# Patient Record
Sex: Female | Born: 1935 | Race: White | Hispanic: No | Smoking: Never smoker
Health system: Southern US, Community
[De-identification: ages and names within clinical notes are randomized; demographics above are authoritative.]

## PROBLEM LIST (undated history)

## (undated) DIAGNOSIS — G259 Extrapyramidal and movement disorder, unspecified: Secondary | ICD-10-CM

## (undated) DIAGNOSIS — I10 Essential (primary) hypertension: Secondary | ICD-10-CM

## (undated) DIAGNOSIS — F329 Major depressive disorder, single episode, unspecified: Secondary | ICD-10-CM

## (undated) DIAGNOSIS — E7800 Pure hypercholesterolemia, unspecified: Secondary | ICD-10-CM

## (undated) DIAGNOSIS — K222 Esophageal obstruction: Secondary | ICD-10-CM

## (undated) DIAGNOSIS — F3289 Other specified depressive episodes: Secondary | ICD-10-CM

## (undated) DIAGNOSIS — M549 Dorsalgia, unspecified: Secondary | ICD-10-CM

## (undated) DIAGNOSIS — M810 Age-related osteoporosis without current pathological fracture: Secondary | ICD-10-CM

## (undated) DIAGNOSIS — D509 Iron deficiency anemia, unspecified: Secondary | ICD-10-CM

## (undated) DIAGNOSIS — R0602 Shortness of breath: Secondary | ICD-10-CM

## (undated) DIAGNOSIS — F0390 Unspecified dementia without behavioral disturbance: Secondary | ICD-10-CM

## (undated) DIAGNOSIS — K449 Diaphragmatic hernia without obstruction or gangrene: Secondary | ICD-10-CM

## (undated) DIAGNOSIS — R42 Dizziness and giddiness: Secondary | ICD-10-CM

## (undated) DIAGNOSIS — Z860100 Personal history of colon polyps, unspecified: Secondary | ICD-10-CM

## (undated) DIAGNOSIS — R634 Abnormal weight loss: Secondary | ICD-10-CM

## (undated) DIAGNOSIS — E785 Hyperlipidemia, unspecified: Secondary | ICD-10-CM

## (undated) DIAGNOSIS — K219 Gastro-esophageal reflux disease without esophagitis: Secondary | ICD-10-CM

## (undated) DIAGNOSIS — E119 Type 2 diabetes mellitus without complications: Secondary | ICD-10-CM

## (undated) DIAGNOSIS — E669 Obesity, unspecified: Secondary | ICD-10-CM

## (undated) DIAGNOSIS — Z8673 Personal history of transient ischemic attack (TIA), and cerebral infarction without residual deficits: Secondary | ICD-10-CM

## (undated) DIAGNOSIS — D126 Benign neoplasm of colon, unspecified: Secondary | ICD-10-CM

## (undated) DIAGNOSIS — J309 Allergic rhinitis, unspecified: Secondary | ICD-10-CM

## (undated) DIAGNOSIS — E1165 Type 2 diabetes mellitus with hyperglycemia: Principal | ICD-10-CM

## (undated) DIAGNOSIS — K5730 Diverticulosis of large intestine without perforation or abscess without bleeding: Secondary | ICD-10-CM

## (undated) DIAGNOSIS — R1319 Other dysphagia: Secondary | ICD-10-CM

## (undated) HISTORY — DX: Personal history of transient ischemic attack (TIA), and cerebral infarction without residual deficits: Z86.73

## (undated) HISTORY — DX: Allergic rhinitis, unspecified: J30.9

## (undated) HISTORY — DX: Esophageal obstruction: K22.2

## (undated) HISTORY — DX: Diverticulosis of large intestine without perforation or abscess without bleeding: K57.30

## (undated) HISTORY — DX: Gastro-esophageal reflux disease without esophagitis: K21.9

## (undated) HISTORY — DX: Iron deficiency anemia, unspecified: D50.9

## (undated) HISTORY — DX: Hyperlipidemia, unspecified: E78.5

## (undated) HISTORY — DX: Age-related osteoporosis without current pathological fracture: M81.0

## (undated) HISTORY — DX: Dorsalgia, unspecified: M54.9

## (undated) HISTORY — DX: Type 2 diabetes mellitus with hyperglycemia: E11.65

## (undated) HISTORY — DX: Personal history of colonic polyps: Z86.010

## (undated) HISTORY — PX: APPENDECTOMY: SHX54

## (undated) HISTORY — PX: ABDOMINAL HYSTERECTOMY: SHX81

## (undated) HISTORY — DX: Obesity, unspecified: E66.9

## (undated) HISTORY — PX: TONSILLECTOMY AND ADENOIDECTOMY: SUR1326

## (undated) HISTORY — DX: Diaphragmatic hernia without obstruction or gangrene: K44.9

## (undated) HISTORY — DX: Shortness of breath: R06.02

## (undated) HISTORY — DX: Pure hypercholesterolemia, unspecified: E78.00

## (undated) HISTORY — DX: Major depressive disorder, single episode, unspecified: F32.9

## (undated) HISTORY — DX: Extrapyramidal and movement disorder, unspecified: G25.9

## (undated) HISTORY — DX: Other specified depressive episodes: F32.89

## (undated) HISTORY — DX: Essential (primary) hypertension: I10

## (undated) HISTORY — DX: Benign neoplasm of colon, unspecified: D12.6

## (undated) HISTORY — DX: Type 2 diabetes mellitus without complications: E11.9

## (undated) HISTORY — PX: OTHER SURGICAL HISTORY: SHX169

## (undated) HISTORY — DX: Other dysphagia: R13.19

## (undated) HISTORY — DX: Unspecified dementia without behavioral disturbance: F03.90

## (undated) HISTORY — DX: Abnormal weight loss: R63.4

## (undated) HISTORY — DX: Personal history of colon polyps, unspecified: Z86.0100

## (undated) HISTORY — DX: Dizziness and giddiness: R42

---

## 2000-06-05 ENCOUNTER — Encounter: Payer: Self-pay | Admitting: Emergency Medicine

## 2000-06-05 ENCOUNTER — Emergency Department (HOSPITAL_COMMUNITY): Admission: EM | Admit: 2000-06-05 | Discharge: 2000-06-05 | Payer: Self-pay | Admitting: Emergency Medicine

## 2000-12-19 ENCOUNTER — Emergency Department (HOSPITAL_COMMUNITY): Admission: EM | Admit: 2000-12-19 | Discharge: 2000-12-19 | Payer: Self-pay | Admitting: *Deleted

## 2000-12-19 ENCOUNTER — Encounter: Payer: Self-pay | Admitting: *Deleted

## 2001-04-20 ENCOUNTER — Encounter: Payer: Self-pay | Admitting: Surgery

## 2001-04-20 ENCOUNTER — Encounter: Admission: RE | Admit: 2001-04-20 | Discharge: 2001-04-20 | Payer: Self-pay | Admitting: Surgery

## 2001-04-21 ENCOUNTER — Encounter: Admission: RE | Admit: 2001-04-21 | Discharge: 2001-04-21 | Payer: Self-pay | Admitting: Surgery

## 2001-04-21 ENCOUNTER — Encounter: Payer: Self-pay | Admitting: Surgery

## 2001-05-03 ENCOUNTER — Encounter: Payer: Self-pay | Admitting: Surgery

## 2001-05-03 ENCOUNTER — Encounter: Admission: RE | Admit: 2001-05-03 | Discharge: 2001-05-03 | Payer: Self-pay | Admitting: Surgery

## 2001-05-30 ENCOUNTER — Encounter: Payer: Self-pay | Admitting: Anesthesiology

## 2001-06-02 ENCOUNTER — Ambulatory Visit (HOSPITAL_COMMUNITY): Admission: RE | Admit: 2001-06-02 | Discharge: 2001-06-02 | Payer: Self-pay | Admitting: Urology

## 2001-09-27 ENCOUNTER — Ambulatory Visit (HOSPITAL_BASED_OUTPATIENT_CLINIC_OR_DEPARTMENT_OTHER): Admission: RE | Admit: 2001-09-27 | Discharge: 2001-09-27 | Payer: Self-pay | Admitting: Urology

## 2003-07-08 ENCOUNTER — Emergency Department (HOSPITAL_COMMUNITY): Admission: EM | Admit: 2003-07-08 | Discharge: 2003-07-09 | Payer: Self-pay | Admitting: Emergency Medicine

## 2003-07-19 ENCOUNTER — Inpatient Hospital Stay (HOSPITAL_COMMUNITY): Admission: EM | Admit: 2003-07-19 | Discharge: 2003-07-21 | Payer: Self-pay | Admitting: Orthopedic Surgery

## 2004-05-08 ENCOUNTER — Encounter: Payer: Self-pay | Admitting: Gastroenterology

## 2004-05-28 DIAGNOSIS — K449 Diaphragmatic hernia without obstruction or gangrene: Secondary | ICD-10-CM | POA: Insufficient documentation

## 2004-05-28 DIAGNOSIS — K222 Esophageal obstruction: Secondary | ICD-10-CM | POA: Insufficient documentation

## 2004-07-24 ENCOUNTER — Ambulatory Visit: Payer: Self-pay | Admitting: Internal Medicine

## 2004-09-17 ENCOUNTER — Ambulatory Visit: Payer: Self-pay | Admitting: Internal Medicine

## 2005-05-12 ENCOUNTER — Ambulatory Visit: Payer: Self-pay | Admitting: Gastroenterology

## 2005-05-13 ENCOUNTER — Ambulatory Visit: Payer: Self-pay | Admitting: Gastroenterology

## 2005-06-01 ENCOUNTER — Ambulatory Visit: Payer: Self-pay | Admitting: Gastroenterology

## 2005-06-15 ENCOUNTER — Encounter (INDEPENDENT_AMBULATORY_CARE_PROVIDER_SITE_OTHER): Payer: Self-pay | Admitting: Specialist

## 2005-06-15 ENCOUNTER — Ambulatory Visit: Payer: Self-pay | Admitting: Gastroenterology

## 2005-06-15 DIAGNOSIS — D126 Benign neoplasm of colon, unspecified: Secondary | ICD-10-CM

## 2005-06-15 HISTORY — DX: Benign neoplasm of colon, unspecified: D12.6

## 2005-06-25 ENCOUNTER — Ambulatory Visit: Payer: Self-pay | Admitting: Cardiology

## 2005-12-21 ENCOUNTER — Ambulatory Visit: Payer: Self-pay | Admitting: Internal Medicine

## 2005-12-24 ENCOUNTER — Ambulatory Visit: Payer: Self-pay | Admitting: Internal Medicine

## 2006-02-16 ENCOUNTER — Ambulatory Visit: Payer: Self-pay | Admitting: Internal Medicine

## 2006-04-07 ENCOUNTER — Ambulatory Visit: Payer: Self-pay | Admitting: Internal Medicine

## 2006-06-24 ENCOUNTER — Ambulatory Visit: Payer: Self-pay | Admitting: Internal Medicine

## 2006-12-30 ENCOUNTER — Encounter: Payer: Self-pay | Admitting: Internal Medicine

## 2007-01-03 ENCOUNTER — Ambulatory Visit: Payer: Self-pay | Admitting: Internal Medicine

## 2007-01-03 LAB — CONVERTED CEMR LAB
ALT: 17 units/L (ref 0–40)
AST: 15 units/L (ref 0–37)
Albumin: 3.7 g/dL (ref 3.5–5.2)
Alkaline Phosphatase: 47 units/L (ref 39–117)
BUN: 34 mg/dL — ABNORMAL HIGH (ref 6–23)
Basophils Absolute: 0.1 10*3/uL (ref 0.0–0.1)
Basophils Relative: 1 % (ref 0.0–1.0)
Bilirubin, Direct: 0.1 mg/dL (ref 0.0–0.3)
CO2: 31 meq/L (ref 19–32)
Calcium: 9.3 mg/dL (ref 8.4–10.5)
Chloride: 99 meq/L (ref 96–112)
Cholesterol: 217 mg/dL (ref 0–200)
Creatinine, Ser: 1.6 mg/dL — ABNORMAL HIGH (ref 0.4–1.2)
Creatinine,U: 71.3 mg/dL
Direct LDL: 105.4 mg/dL
Eosinophils Absolute: 0.2 10*3/uL (ref 0.0–0.6)
Eosinophils Relative: 2.4 % (ref 0.0–5.0)
GFR calc Af Amer: 41 mL/min
GFR calc non Af Amer: 34 mL/min
Glucose, Bld: 188 mg/dL — ABNORMAL HIGH (ref 70–99)
HCT: 32.8 % — ABNORMAL LOW (ref 36.0–46.0)
HDL: 52.2 mg/dL (ref >39.0)
Hemoglobin: 11.2 g/dL — ABNORMAL LOW (ref 12.0–15.0)
Hgb A1c MFr Bld: 8.8 % — ABNORMAL HIGH (ref 4.6–6.0)
Lymphocytes Relative: 37.9 % (ref 12.0–46.0)
MCHC: 34.2 g/dL (ref 30.0–36.0)
MCV: 88 fL (ref 78.0–100.0)
Microalb Creat Ratio: 84.2 mg/g — ABNORMAL HIGH (ref 0.0–30.0)
Microalb, Ur: 6 mg/dL — ABNORMAL HIGH (ref 0.0–1.9)
Monocytes Absolute: 0.5 10*3/uL (ref 0.2–0.7)
Monocytes Relative: 7.5 % (ref 3.0–11.0)
Neutro Abs: 3.7 10*3/uL (ref 1.4–7.7)
Neutrophils Relative %: 51.2 % (ref 43.0–77.0)
Platelets: 269 10*3/uL (ref 150–400)
Potassium: 4.1 meq/L (ref 3.5–5.1)
RBC: 3.72 M/uL — ABNORMAL LOW (ref 3.87–5.11)
RDW: 11.8 % (ref 11.5–14.6)
Sodium: 136 meq/L (ref 135–145)
TSH: 1.38 microintl units/mL (ref 0.35–5.50)
Total Bilirubin: 0.3 mg/dL (ref 0.3–1.2)
Total CHOL/HDL Ratio: 4.2
Total Protein: 7.5 g/dL (ref 6.0–8.3)
Triglycerides: 285 mg/dL (ref 0–149)
VLDL: 57 mg/dL — ABNORMAL HIGH (ref 0–40)
WBC: 7.3 10*3/uL (ref 4.5–10.5)

## 2007-04-22 ENCOUNTER — Encounter: Payer: Self-pay | Admitting: Internal Medicine

## 2007-04-22 DIAGNOSIS — K219 Gastro-esophageal reflux disease without esophagitis: Secondary | ICD-10-CM | POA: Insufficient documentation

## 2007-04-22 DIAGNOSIS — E669 Obesity, unspecified: Secondary | ICD-10-CM | POA: Insufficient documentation

## 2007-04-22 DIAGNOSIS — I10 Essential (primary) hypertension: Secondary | ICD-10-CM | POA: Insufficient documentation

## 2007-04-22 DIAGNOSIS — E119 Type 2 diabetes mellitus without complications: Secondary | ICD-10-CM

## 2007-04-22 DIAGNOSIS — E7800 Pure hypercholesterolemia, unspecified: Secondary | ICD-10-CM | POA: Insufficient documentation

## 2007-04-22 HISTORY — DX: Type 2 diabetes mellitus without complications: E11.9

## 2007-05-04 DIAGNOSIS — M810 Age-related osteoporosis without current pathological fracture: Secondary | ICD-10-CM | POA: Insufficient documentation

## 2007-05-04 DIAGNOSIS — E785 Hyperlipidemia, unspecified: Secondary | ICD-10-CM | POA: Insufficient documentation

## 2007-09-08 HISTORY — PX: OTHER SURGICAL HISTORY: SHX169

## 2007-10-24 ENCOUNTER — Ambulatory Visit: Payer: Self-pay | Admitting: Internal Medicine

## 2007-10-24 DIAGNOSIS — R5381 Other malaise: Secondary | ICD-10-CM

## 2007-10-24 DIAGNOSIS — K5730 Diverticulosis of large intestine without perforation or abscess without bleeding: Secondary | ICD-10-CM | POA: Insufficient documentation

## 2007-10-24 DIAGNOSIS — Z860100 Personal history of colon polyps, unspecified: Secondary | ICD-10-CM | POA: Insufficient documentation

## 2007-10-24 DIAGNOSIS — R5383 Other fatigue: Secondary | ICD-10-CM

## 2007-10-26 LAB — CONVERTED CEMR LAB
ALT: 16 units/L (ref 0–35)
AST: 16 units/L (ref 0–37)
Albumin: 3.8 g/dL (ref 3.5–5.2)
Alkaline Phosphatase: 50 units/L (ref 39–117)
BUN: 23 mg/dL (ref 6–23)
Basophils Absolute: 0.1 10*3/uL (ref 0.0–0.1)
Basophils Relative: 0.9 % (ref 0.0–1.0)
Bilirubin, Direct: 0.1 mg/dL (ref 0.0–0.3)
CO2: 27 meq/L (ref 19–32)
Calcium: 9.4 mg/dL (ref 8.4–10.5)
Chloride: 101 meq/L (ref 96–112)
Cholesterol: 212 mg/dL (ref 0–200)
Creatinine, Ser: 1.1 mg/dL (ref 0.4–1.2)
Creatinine,U: 52 mg/dL
Direct LDL: 108.7 mg/dL
Eosinophils Absolute: 0.2 10*3/uL (ref 0.0–0.6)
Eosinophils Relative: 2.8 % (ref 0.0–5.0)
GFR calc Af Amer: 62 mL/min
GFR calc non Af Amer: 52 mL/min
Glucose, Bld: 141 mg/dL — ABNORMAL HIGH (ref 70–99)
HCT: 33.1 % — ABNORMAL LOW (ref 36.0–46.0)
HDL: 54 mg/dL (ref >39.0)
Hemoglobin: 11.2 g/dL — ABNORMAL LOW (ref 12.0–15.0)
Hgb A1c MFr Bld: 7.6 % — ABNORMAL HIGH (ref 4.6–6.0)
Lymphocytes Relative: 33.9 % (ref 12.0–46.0)
MCHC: 33.7 g/dL (ref 30.0–36.0)
MCV: 88.6 fL (ref 78.0–100.0)
Microalb Creat Ratio: 167.3 mg/g — ABNORMAL HIGH (ref 0.0–30.0)
Microalb, Ur: 8.7 mg/dL — ABNORMAL HIGH (ref 0.0–1.9)
Monocytes Absolute: 0.7 10*3/uL (ref 0.2–0.7)
Monocytes Relative: 8.2 % (ref 3.0–11.0)
Neutro Abs: 4.6 10*3/uL (ref 1.4–7.7)
Neutrophils Relative %: 54.2 % (ref 43.0–77.0)
Platelets: 283 10*3/uL (ref 150–400)
Potassium: 4.1 meq/L (ref 3.5–5.1)
RBC: 3.74 M/uL — ABNORMAL LOW (ref 3.87–5.11)
RDW: 12.3 % (ref 11.5–14.6)
Sodium: 136 meq/L (ref 135–145)
TSH: 1.51 microintl units/mL (ref 0.35–5.50)
Total Bilirubin: 0.5 mg/dL (ref 0.3–1.2)
Total CHOL/HDL Ratio: 3.9
Total Protein: 7.6 g/dL (ref 6.0–8.3)
Triglycerides: 236 mg/dL (ref 0–149)
VLDL: 47 mg/dL — ABNORMAL HIGH (ref 0–40)
WBC: 8.4 10*3/uL (ref 4.5–10.5)

## 2007-10-27 ENCOUNTER — Encounter: Payer: Self-pay | Admitting: Internal Medicine

## 2007-10-31 ENCOUNTER — Ambulatory Visit: Payer: Self-pay | Admitting: Internal Medicine

## 2007-11-02 ENCOUNTER — Telehealth: Payer: Self-pay | Admitting: Internal Medicine

## 2007-11-21 ENCOUNTER — Ambulatory Visit: Payer: Self-pay | Admitting: Cardiology

## 2007-11-21 LAB — CONVERTED CEMR LAB: Pro B Natriuretic peptide (BNP): 46 pg/mL (ref 0.0–100.0)

## 2007-12-01 ENCOUNTER — Ambulatory Visit: Payer: Self-pay

## 2007-12-01 ENCOUNTER — Encounter: Payer: Self-pay | Admitting: Cardiology

## 2007-12-06 ENCOUNTER — Ambulatory Visit: Payer: Self-pay | Admitting: Cardiology

## 2007-12-28 ENCOUNTER — Ambulatory Visit: Payer: Self-pay | Admitting: Emergency Medicine

## 2007-12-28 DIAGNOSIS — R0602 Shortness of breath: Secondary | ICD-10-CM

## 2008-01-09 ENCOUNTER — Ambulatory Visit: Payer: Self-pay | Admitting: Emergency Medicine

## 2008-01-12 ENCOUNTER — Encounter: Payer: Self-pay | Admitting: Emergency Medicine

## 2008-01-18 ENCOUNTER — Ambulatory Visit: Payer: Self-pay | Admitting: Emergency Medicine

## 2008-01-26 DIAGNOSIS — K5900 Constipation, unspecified: Secondary | ICD-10-CM | POA: Insufficient documentation

## 2008-01-31 ENCOUNTER — Ambulatory Visit: Payer: Self-pay | Admitting: Gastroenterology

## 2008-02-02 ENCOUNTER — Ambulatory Visit (HOSPITAL_COMMUNITY): Admission: RE | Admit: 2008-02-02 | Discharge: 2008-02-02 | Payer: Self-pay | Admitting: Gastroenterology

## 2008-02-03 ENCOUNTER — Telehealth (INDEPENDENT_AMBULATORY_CARE_PROVIDER_SITE_OTHER): Payer: Self-pay

## 2008-03-05 ENCOUNTER — Encounter: Payer: Self-pay | Admitting: Gastroenterology

## 2008-03-05 ENCOUNTER — Encounter: Payer: Self-pay | Admitting: Internal Medicine

## 2008-04-20 ENCOUNTER — Inpatient Hospital Stay (HOSPITAL_COMMUNITY): Admission: RE | Admit: 2008-04-20 | Discharge: 2008-04-24 | Payer: Self-pay | Admitting: Surgery

## 2008-05-18 ENCOUNTER — Encounter: Payer: Self-pay | Admitting: Internal Medicine

## 2008-05-18 ENCOUNTER — Encounter: Payer: Self-pay | Admitting: Gastroenterology

## 2008-08-09 ENCOUNTER — Encounter: Payer: Self-pay | Admitting: Internal Medicine

## 2008-09-10 LAB — HM MAMMOGRAPHY: HM Mammogram: NORMAL

## 2008-09-24 ENCOUNTER — Encounter: Payer: Self-pay | Admitting: Internal Medicine

## 2008-11-09 ENCOUNTER — Ambulatory Visit: Payer: Self-pay | Admitting: Internal Medicine

## 2008-11-12 LAB — CONVERTED CEMR LAB
ALT: 18 units/L (ref 0–35)
AST: 19 units/L (ref 0–37)
Albumin: 3.6 g/dL (ref 3.5–5.2)
Alkaline Phosphatase: 51 units/L (ref 39–117)
BUN: 19 mg/dL (ref 6–23)
Basophils Absolute: 0.1 10*3/uL (ref 0.0–0.1)
Basophils Relative: 0.9 % (ref 0.0–3.0)
Bilirubin, Direct: 0.1 mg/dL (ref 0.0–0.3)
CO2: 28 meq/L (ref 19–32)
Calcium: 9.3 mg/dL (ref 8.4–10.5)
Chloride: 104 meq/L (ref 96–112)
Cholesterol: 195 mg/dL (ref 0–200)
Creatinine, Ser: 1 mg/dL (ref 0.4–1.2)
Creatinine,U: 23.6 mg/dL
Eosinophils Absolute: 0.2 10*3/uL (ref 0.0–0.7)
Eosinophils Relative: 3.1 % (ref 0.0–5.0)
GFR calc Af Amer: 70 mL/min
GFR calc non Af Amer: 57 mL/min
Glucose, Bld: 77 mg/dL (ref 70–99)
HCT: 31.2 % — ABNORMAL LOW (ref 36.0–46.0)
HDL: 59 mg/dL (ref >39.0)
Hemoglobin: 10.8 g/dL — ABNORMAL LOW (ref 12.0–15.0)
Hgb A1c MFr Bld: 8.1 % — ABNORMAL HIGH (ref 4.6–6.0)
LDL Cholesterol: 107 mg/dL — ABNORMAL HIGH (ref 0–99)
Lymphocytes Relative: 40.2 % (ref 12.0–46.0)
MCHC: 34.5 g/dL (ref 30.0–36.0)
MCV: 87.4 fL (ref 78.0–100.0)
Microalb Creat Ratio: 402.5 mg/g — ABNORMAL HIGH (ref 0.0–30.0)
Microalb, Ur: 9.5 mg/dL — ABNORMAL HIGH (ref 0.0–1.9)
Monocytes Absolute: 0.5 10*3/uL (ref 0.1–1.0)
Monocytes Relative: 8.3 % (ref 3.0–12.0)
Neutro Abs: 3 10*3/uL (ref 1.4–7.7)
Neutrophils Relative %: 47.5 % (ref 43.0–77.0)
Platelets: 272 10*3/uL (ref 150–400)
Potassium: 3.9 meq/L (ref 3.5–5.1)
RBC: 3.57 M/uL — ABNORMAL LOW (ref 3.87–5.11)
RDW: 12.6 % (ref 11.5–14.6)
Sodium: 141 meq/L (ref 135–145)
TSH: 2.51 microintl units/mL (ref 0.35–5.50)
Total Bilirubin: 0.5 mg/dL (ref 0.3–1.2)
Total CHOL/HDL Ratio: 3.3
Total Protein: 7.2 g/dL (ref 6.0–8.3)
Triglycerides: 145 mg/dL (ref 0–149)
VLDL: 29 mg/dL (ref 0–40)
WBC: 6.4 10*3/uL (ref 4.5–10.5)

## 2009-03-22 ENCOUNTER — Inpatient Hospital Stay (HOSPITAL_COMMUNITY): Admission: AD | Admit: 2009-03-22 | Discharge: 2009-03-25 | Payer: Self-pay | Admitting: Internal Medicine

## 2009-03-22 ENCOUNTER — Ambulatory Visit: Payer: Self-pay | Admitting: Internal Medicine

## 2009-03-22 DIAGNOSIS — R1319 Other dysphagia: Secondary | ICD-10-CM

## 2009-03-22 DIAGNOSIS — E860 Dehydration: Secondary | ICD-10-CM | POA: Insufficient documentation

## 2009-03-22 DIAGNOSIS — R634 Abnormal weight loss: Secondary | ICD-10-CM

## 2009-03-22 DIAGNOSIS — R42 Dizziness and giddiness: Secondary | ICD-10-CM | POA: Insufficient documentation

## 2009-03-22 DIAGNOSIS — R059 Cough, unspecified: Secondary | ICD-10-CM | POA: Insufficient documentation

## 2009-03-22 DIAGNOSIS — R05 Cough: Secondary | ICD-10-CM

## 2009-03-25 ENCOUNTER — Encounter (INDEPENDENT_AMBULATORY_CARE_PROVIDER_SITE_OTHER): Payer: Self-pay | Admitting: *Deleted

## 2009-03-28 ENCOUNTER — Ambulatory Visit: Payer: Self-pay | Admitting: Gastroenterology

## 2009-05-01 ENCOUNTER — Ambulatory Visit: Payer: Self-pay | Admitting: Internal Medicine

## 2009-05-02 ENCOUNTER — Encounter (INDEPENDENT_AMBULATORY_CARE_PROVIDER_SITE_OTHER): Payer: Self-pay | Admitting: Internal Medicine

## 2009-05-02 ENCOUNTER — Ambulatory Visit: Payer: Self-pay | Admitting: Vascular Surgery

## 2009-05-02 ENCOUNTER — Ambulatory Visit: Payer: Self-pay | Admitting: Internal Medicine

## 2009-05-02 ENCOUNTER — Inpatient Hospital Stay (HOSPITAL_COMMUNITY): Admission: EM | Admit: 2009-05-02 | Discharge: 2009-05-04 | Payer: Self-pay | Admitting: Emergency Medicine

## 2009-05-06 ENCOUNTER — Telehealth: Payer: Self-pay | Admitting: Internal Medicine

## 2009-05-10 ENCOUNTER — Ambulatory Visit: Payer: Self-pay | Admitting: Internal Medicine

## 2009-05-10 DIAGNOSIS — D509 Iron deficiency anemia, unspecified: Secondary | ICD-10-CM

## 2009-05-10 DIAGNOSIS — F329 Major depressive disorder, single episode, unspecified: Secondary | ICD-10-CM

## 2009-05-10 LAB — CONVERTED CEMR LAB
Cholesterol, target level: 200 mg/dL
HDL goal, serum: 40 mg/dL
LDL Goal: 100 mg/dL

## 2009-05-14 ENCOUNTER — Encounter (INDEPENDENT_AMBULATORY_CARE_PROVIDER_SITE_OTHER): Payer: Self-pay | Admitting: *Deleted

## 2009-05-20 ENCOUNTER — Ambulatory Visit: Payer: Self-pay | Admitting: Internal Medicine

## 2009-05-24 ENCOUNTER — Telehealth: Payer: Self-pay | Admitting: Internal Medicine

## 2009-05-28 ENCOUNTER — Ambulatory Visit: Payer: Self-pay | Admitting: Internal Medicine

## 2009-05-28 DIAGNOSIS — M549 Dorsalgia, unspecified: Secondary | ICD-10-CM | POA: Insufficient documentation

## 2009-05-29 ENCOUNTER — Telehealth: Payer: Self-pay | Admitting: Internal Medicine

## 2009-05-31 ENCOUNTER — Telehealth: Payer: Self-pay | Admitting: Internal Medicine

## 2009-06-03 ENCOUNTER — Encounter: Payer: Self-pay | Admitting: Internal Medicine

## 2009-06-04 ENCOUNTER — Ambulatory Visit: Payer: Self-pay | Admitting: Gastroenterology

## 2009-06-04 LAB — CONVERTED CEMR LAB
ALT: 15 units/L (ref 0–35)
AST: 24 units/L (ref 0–37)
Albumin: 3.7 g/dL (ref 3.5–5.2)
Alkaline Phosphatase: 249 units/L — ABNORMAL HIGH (ref 39–117)
BUN: 21 mg/dL (ref 6–23)
Basophils Absolute: 0.1 10*3/uL (ref 0.0–0.1)
Basophils Relative: 0.8 % (ref 0.0–3.0)
Bilirubin, Direct: 0 mg/dL (ref 0.0–0.3)
CO2: 31 meq/L (ref 19–32)
Calcium: 8.9 mg/dL (ref 8.4–10.5)
Chloride: 106 meq/L (ref 96–112)
Creatinine, Ser: 1.5 mg/dL — ABNORMAL HIGH (ref 0.4–1.2)
Eosinophils Absolute: 0.1 10*3/uL (ref 0.0–0.7)
Eosinophils Relative: 1.9 % (ref 0.0–5.0)
Ferritin: 61.9 ng/mL (ref 10.0–291.0)
Folate: 13.2 ng/mL
GFR calc non Af Amer: 35.85 mL/min (ref >60)
Glucose, Bld: 196 mg/dL — ABNORMAL HIGH (ref 70–99)
HCT: 29.5 % — ABNORMAL LOW (ref 36.0–46.0)
Hemoglobin: 9.8 g/dL — ABNORMAL LOW (ref 12.0–15.0)
IgA: 455 mg/dL — ABNORMAL HIGH (ref 68–378)
Iron: 20 ug/dL — ABNORMAL LOW (ref 42–145)
Lymphocytes Relative: 32.2 % (ref 12.0–46.0)
Lymphs Abs: 2.1 10*3/uL (ref 0.7–4.0)
MCHC: 33.3 g/dL (ref 30.0–36.0)
MCV: 89.7 fL (ref 78.0–100.0)
Monocytes Absolute: 0.5 10*3/uL (ref 0.1–1.0)
Monocytes Relative: 7.7 % (ref 3.0–12.0)
Neutro Abs: 3.7 10*3/uL (ref 1.4–7.7)
Neutrophils Relative %: 57.4 % (ref 43.0–77.0)
Platelets: 274 10*3/uL (ref 150.0–400.0)
Potassium: 4 meq/L (ref 3.5–5.1)
RBC: 3.28 M/uL — ABNORMAL LOW (ref 3.87–5.11)
RDW: 13.2 % (ref 11.5–14.6)
Saturation Ratios: 7.1 % — ABNORMAL LOW (ref 20.0–50.0)
Sodium: 141 meq/L (ref 135–145)
TSH: 0.98 microintl units/mL (ref 0.35–5.50)
Tissue Transglutaminase Ab, IgA: 0.5 units (ref <7)
Total Bilirubin: 0.2 mg/dL — ABNORMAL LOW (ref 0.3–1.2)
Total Protein: 7.9 g/dL (ref 6.0–8.3)
Transferrin: 201.4 mg/dL — ABNORMAL LOW (ref 212.0–360.0)
Vitamin B-12: 562 pg/mL (ref 211–911)
WBC: 6.5 10*3/uL (ref 4.5–10.5)

## 2009-06-21 ENCOUNTER — Encounter: Payer: Self-pay | Admitting: Internal Medicine

## 2009-08-09 ENCOUNTER — Encounter: Payer: Self-pay | Admitting: Internal Medicine

## 2009-12-30 ENCOUNTER — Ambulatory Visit: Payer: Self-pay | Admitting: Internal Medicine

## 2009-12-30 DIAGNOSIS — J309 Allergic rhinitis, unspecified: Secondary | ICD-10-CM | POA: Insufficient documentation

## 2009-12-31 LAB — CONVERTED CEMR LAB
ALT: 17 units/L (ref 0–35)
AST: 21 units/L (ref 0–37)
Albumin: 3.9 g/dL (ref 3.5–5.2)
Alkaline Phosphatase: 69 units/L (ref 39–117)
BUN: 16 mg/dL (ref 6–23)
Basophils Absolute: 0.1 10*3/uL (ref 0.0–0.1)
Basophils Relative: 0.8 % (ref 0.0–3.0)
Bilirubin, Direct: 0.1 mg/dL (ref 0.0–0.3)
CO2: 29 meq/L (ref 19–32)
Calcium: 9.2 mg/dL (ref 8.4–10.5)
Chloride: 103 meq/L (ref 96–112)
Cholesterol: 216 mg/dL — ABNORMAL HIGH (ref 0–200)
Creatinine, Ser: 0.9 mg/dL (ref 0.4–1.2)
Creatinine,U: 20.4 mg/dL
Direct LDL: 129.3 mg/dL
Eosinophils Absolute: 0.2 10*3/uL (ref 0.0–0.7)
Eosinophils Relative: 2.2 % (ref 0.0–5.0)
Folate: 17 ng/mL
GFR calc non Af Amer: 64.53 mL/min (ref >60)
Glucose, Bld: 61 mg/dL — ABNORMAL LOW (ref 70–99)
HCT: 31 % — ABNORMAL LOW (ref 36.0–46.0)
HDL: 61.9 mg/dL (ref >39.00)
Hemoglobin: 10.6 g/dL — ABNORMAL LOW (ref 12.0–15.0)
Hgb A1c MFr Bld: 6.6 % — ABNORMAL HIGH (ref 4.6–6.5)
Iron: 69 ug/dL (ref 42–145)
Lymphocytes Relative: 34.2 % (ref 12.0–46.0)
Lymphs Abs: 2.6 10*3/uL (ref 0.7–4.0)
MCHC: 34.1 g/dL (ref 30.0–36.0)
MCV: 90 fL (ref 78.0–100.0)
Microalb Creat Ratio: 779.4 mg/g — ABNORMAL HIGH (ref 0.0–30.0)
Microalb, Ur: 15.9 mg/dL — ABNORMAL HIGH (ref 0.0–1.9)
Monocytes Absolute: 0.5 10*3/uL (ref 0.1–1.0)
Monocytes Relative: 6.9 % (ref 3.0–12.0)
Neutro Abs: 4.2 10*3/uL (ref 1.4–7.7)
Neutrophils Relative %: 55.9 % (ref 43.0–77.0)
Platelets: 278 10*3/uL (ref 150.0–400.0)
Potassium: 4 meq/L (ref 3.5–5.1)
RBC: 3.44 M/uL — ABNORMAL LOW (ref 3.87–5.11)
RDW: 13.5 % (ref 11.5–14.6)
Saturation Ratios: 20 % (ref 20.0–50.0)
Sed Rate: 47 mm/hr — ABNORMAL HIGH (ref 0–22)
Sodium: 141 meq/L (ref 135–145)
TSH: 1.88 microintl units/mL (ref 0.35–5.50)
Total Bilirubin: 0.6 mg/dL (ref 0.3–1.2)
Total CHOL/HDL Ratio: 3
Total Protein: 6.9 g/dL (ref 6.0–8.3)
Transferrin: 246.6 mg/dL (ref 212.0–360.0)
Triglycerides: 176 mg/dL — ABNORMAL HIGH (ref 0.0–149.0)
VLDL: 35.2 mg/dL (ref 0.0–40.0)
Vitamin B-12: 358 pg/mL (ref 211–911)
WBC: 7.5 10*3/uL (ref 4.5–10.5)

## 2010-02-06 ENCOUNTER — Encounter: Payer: Self-pay | Admitting: Internal Medicine

## 2010-06-12 ENCOUNTER — Ambulatory Visit: Payer: Self-pay | Admitting: Internal Medicine

## 2010-08-15 ENCOUNTER — Ambulatory Visit: Payer: Self-pay | Admitting: Internal Medicine

## 2010-10-07 NOTE — Assessment & Plan Note (Signed)
Summary: PER SON-D/T--FU PER SARAH SCHED--HANDICAP PLACARD REQUEST --STC   Vital Signs:  Patient profile:   77 year old female Height:      62 inches (157.48 cm) Weight:      152.4 pounds (69.27 kg) O2 Sat:      96 % on Room air Temp:     98.0 degrees F (36.67 degrees C) oral Pulse rate:   86 / minute BP sitting:   132 / 70  (left arm) Cuff size:   regular  Vitals Entered By: Orlan Leavens RMA (August 15, 2010 1:49 PM)  O2 Flow:  Room air CC: follow-up visit Is Patient Diabetic? Yes Did you bring your meter with you today? No Pain Assessment Patient in pain? no      Comments Pt states she stop taking citalopram. Also want to discuss Norvasc currently not taking. Requesting refills on meds, and handi cap form   Primary Care Provider:  Oliver Barre, M.D.  CC:  follow-up visit.  History of Present Illness: here with famly - overall doing ok, but since last seen has developed midl to mod worsening reflux symptoms with sour brash similar to that she had prior to anti-reflux surgury;  some improvement with TUMS  , and discomfort is non exertional, nonpleuritc, SSCP without  radiation, sob, diaphoresis palps or syncope though has occasioanl nausea with it.  Denies dysphagia, abd pain, bowel changes, blood, wt loss.  Overall good compliance with meds, and good tolerability.  Pt denies other CP, worsening sob, doe, wheezing, orthopnea, pnd, worsening LE edema, palps, dizziness or syncope .  Pt denies new neuro symptoms such as headache, facial or extremity weakness Pt denies polydipsia, polyuria, or low sugar symptoms such as shakiness improved with eating.  Overall good compliance with meds, trying to follow low chol, DM diet, and does walk daily.  Due to her gradual wt loss in the past yr or so, she had to stop the norvasc after last visit due to weakness and dizziness, since resolved.  Also seems to have rebounded nicely with the grief reaction over her husband's death, citalopram helped  quite a bit, but she stopped about a month ago, adn has since done well  - Denies worsening depressive symptoms, suicidal ideation, or panic.  Needs form filled out for driving handicap plate today  Problems Prior to Update: 1)  Allergic Rhinitis  (ICD-477.9) 2)  Back Pain  (ICD-724.5) 3)  Depression  (ICD-311) 4)  Anemia-iron Deficiency  (ICD-280.9) 5)  Need For Other Specified Prophylactic Measure  (ICD-V07.8) 6)  Weight Loss  (ICD-783.21) 7)  Other Dysphagia  (ICD-787.29) 8)  Dehydration  (ICD-276.51) 9)  Dizziness  (ICD-780.4) 10)  Cough  (ICD-786.2) 11)  Constipation  (ICD-564.00) 12)  Esophageal Stricture  (ICD-530.3) 13)  Hiatal Hernia  (ICD-553.3) 14)  Colonic Polyps  (ICD-211.3) 15)  Dyspnea  (ICD-786.05) 16)  Preventive Health Care  (ICD-V70.0) 17)  Fatigue  (ICD-780.79) 18)  Diverticulosis, Colon  (ICD-562.10) 19)  Colonic Polyps, Hx of  (ICD-V12.72) 20)  Osteoporosis  (ICD-733.00) 21)  Hyperlipidemia  (ICD-272.4) 22)  Hypercholesterolemia  (ICD-272.0) 23)  Obesity  (ICD-278.00) 24)  Hypertension  (ICD-401.9) 25)  Gerd  (ICD-530.81) 26)  Diabetes Mellitus, Type II  (ICD-250.00)  Medications Prior to Update: 1)  Aurora Lancet Super Thin 30g  Misc (Lancets) .... Use 1 Stick As Directed Once  A Day 2)  Onetouch Test  Strp (Glucose Blood) .... Use 1 Strip Once A Day  250.02 3)  Simvastatin 40 Mg Tabs (Simvastatin) .Marland Kitchen.. 1po Once Daily 4)  Norvasc 10 Mg  Tabs (Amlodipine Besylate) .... Take 1 By Mouth Once Daily 5)  Lantus 100 Unit/ml  Soln (Insulin Glargine) .... Take 40 Units Subcutaneously Once Daily 6)  Ecotrin Low Strength 81 Mg  Tbec (Aspirin) .Marland Kitchen.. 1po Qd 7)  Metformin Hcl 500 Mg  Tabs (Metformin Hcl) .... 2 By Mouth Two Times A Day 8)  Citalopram Hydrobromide 10 Mg Tabs (Citalopram Hydrobromide) .Marland Kitchen.. 1po Once Daily 9)  Claritin 10 Mg Tabs (Loratadine) .Marland Kitchen.. 1po Once Daily As Needed  Current Medications (verified): 1)  Aurora Lancet Super Thin 30g  Misc (Lancets)  .... Use 1 Stick As Directed Once  A Day 2)  Simvastatin 40 Mg Tabs (Simvastatin) .Marland Kitchen.. 1po Once Daily 3)  Norvasc 10 Mg  Tabs (Amlodipine Besylate) .... Take 1 By Mouth Once Daily 4)  Lantus 100 Unit/ml  Soln (Insulin Glargine) .... Take 40 Units Subcutaneously Once Daily 5)  Ecotrin Low Strength 81 Mg  Tbec (Aspirin) .Marland Kitchen.. 1po Qd 6)  Metformin Hcl 500 Mg  Tabs (Metformin Hcl) .... 2 By Mouth Two Times A Day 7)  Claritin 10 Mg Tabs (Loratadine) .Marland Kitchen.. 1po Once Daily As Needed 8)  Omeprazole 40 Mg Cpdr (Omeprazole) .Marland Kitchen.. 1po Once Daily  Allergies (verified): 1)  ! Vicodin 2)  ! Codeine 3)  ! Percocet  Past History:  Past Medical History: Last updated: 12/30/2009 Diabetes mellitus, type II GERD Hypertension Hyperlipidemia Obesity Osteoporosis DM gastroparesis chronic constipation Colonic polyps, hx of - Dr Patterson/GI Diverticulosis, colon nephropathy - DM retinopathy - DM Anemia-iron deficiency Depression Allergic rhinitis  Past Surgical History: Last updated: 06/03/2009 Appendectomy Hysterectomy Tonsillectomy adenoidectomy left ankle surgery s/p bladder pubovaginal sling s/p cystocele/rectocele repair Laparoscopic takedown of incarcerated stomach within the chest/NISSEN 2009.   Social History: Last updated: 12/30/2009 Never Smoked Alcohol use-no widow Daily Caffeine Use husband terminally ill with lung cancer - married x 53yrs - died 2010-04-18Drug use-no  Risk Factors: Smoking Status: never (10/24/2007)  Review of Systems       all otherwise negative per pt -    Physical Exam  General:  alert and well-developed.   Head:  normocephalic and atraumatic.   Eyes:  vision grossly intact, pupils equal, and pupils round.   Ears:  R ear normal and L ear normal.   Nose:  no external deformity and no nasal discharge.   Mouth:  no gingival abnormalities and pharynx pink and moist.   Neck:  supple and no masses.   Lungs:  normal respiratory effort and normal  breath sounds.   Heart:  normal rate and regular rhythm.   Abdomen:  soft, non-tender, and normal bowel sounds.   Extremities:  no edema, no erythema  Psych:  not anxious appearing and not depressed appearing.     Impression & Recommendations:  Problem # 1:  GERD (ICD-530.81)  Her updated medication list for this problem includes:    Omeprazole 40 Mg Cpdr (Omeprazole) .Marland Kitchen... 1po once daily treat as above, f/u any worsening signs or symptoms , ok for TUMS as needed, and consider Dr Jarold Motto f/u if not improved  Labs Reviewed: Hgb: 10.6 (12/30/2009)   Hct: 31.0 (12/30/2009)  Problem # 2:  HYPERTENSION (ICD-401.9)  Her updated medication list for this problem includes:    Norvasc 10 Mg Tabs (Amlodipine besylate) .Marland Kitchen... Take 1 by mouth once daily  BP today: 132/70 Prior BP: 146/60 (12/30/2009)  Prior 10 Yr Risk Heart Disease:  24 % (05/10/2009)  Labs Reviewed: K+: 4.0 (12/30/2009) Creat: : 0.9 (12/30/2009)   Chol: 216 (12/30/2009)   HDL: 61.90 (12/30/2009)   LDL: 107 (11/09/2008)   TG: 176.0 (12/30/2009) stable overall by hx and exam, ok to continue meds/tx as is - does not need norvasc at this time, to f/u BP at home and next visit  Problem # 3:  DIABETES MELLITUS, TYPE II (ICD-250.00)  Her updated medication list for this problem includes:    Lantus 100 Unit/ml Soln (Insulin glargine) .Marland Kitchen... Take 40 units subcutaneously once daily    Ecotrin Low Strength 81 Mg Tbec (Aspirin) .Marland Kitchen... 1po qd    Metformin Hcl 500 Mg Tabs (Metformin hcl) .Marland Kitchen... 2 by mouth two times a day  Labs Reviewed: Creat: 0.9 (12/30/2009)    Reviewed HgBA1c results: 6.6 (12/30/2009)  8.1 (11/09/2008) has been very complaint with diet now that she lives with supportive family, wt overall stable, good med tolerance,  will forgo labs today as per pt request as not likely to have changed significantly and f/u next visit,  Pt to cont DM diet, excercise, wt control efforts  Problem # 4:  DEPRESSION (ICD-311)  The  following medications were removed from the medication list:    Citalopram Hydrobromide 10 Mg Tabs (Citalopram hydrobromide) .Marland Kitchen... 1po once daily with grief reaction; resolved, ok to follow off med  Complete Medication List: 1)  Aurora Lancet Super Thin 30g Misc (Lancets) .... Use 1 stick as directed once  a day 2)  Simvastatin 40 Mg Tabs (Simvastatin) .Marland Kitchen.. 1po once daily 3)  Norvasc 10 Mg Tabs (Amlodipine besylate) .... Take 1 by mouth once daily 4)  Lantus 100 Unit/ml Soln (Insulin glargine) .... Take 40 units subcutaneously once daily 5)  Ecotrin Low Strength 81 Mg Tbec (Aspirin) .Marland Kitchen.. 1po qd 6)  Metformin Hcl 500 Mg Tabs (Metformin hcl) .... 2 by mouth two times a day 7)  Claritin 10 Mg Tabs (Loratadine) .Marland Kitchen.. 1po once daily as needed 8)  Omeprazole 40 Mg Cpdr (Omeprazole) .Marland Kitchen.. 1po once daily  Patient Instructions: 1)  you are given the handicap form filled out today 2)  Please take all new medications as prescribed - the omeprazole 40 mg per day 3)  Continue all previous medications as before this visit 4)  Please schedule a follow-up appointment in April 2012 for followup office visit Prescriptions: OMEPRAZOLE 40 MG CPDR (OMEPRAZOLE) 1po once daily  #90 x 3   Entered and Authorized by:   Corwin Levins MD   Signed by:   Corwin Levins MD on 08/15/2010   Method used:   Electronically to        [facility] (retail)       [address]       [place], [place]  [address]       Ph: [phone]       Fax: [phone]   RxID:   [id number] METFORMIN HCL 500 MG  TABS (METFORMIN HCL) 2 by mouth two times a day  #360 x 3   Entered and Authorized by:   Corwin Levins MD   Signed by:   Corwin Levins MD on 08/15/2010   Method used:   Electronically to        [facility] (retail)       [address]       [place],  [place]  [address]       Ph: [phone]       Fax: [phone]   RxID:   [id number] METFORMIN HCL 500 MG   TABS (METFORMIN HCL) 2 by mouth two times a day  #260 x 3   Entered and Authorized by:   Corwin Levins MD   Signed by:   Corwin Levins MD on 08/15/2010   Method used:   Electronically to        [facility] (retail)       [address]       [place], [place]  [address]       Ph: [phone]       Fax: [phone]   RxID:   [id number]    Orders Added: 1)  Est. Patient Level IV [88416]

## 2010-10-07 NOTE — Assessment & Plan Note (Signed)
Summary: flu shot/#/cd   Nurse Visit   Allergies: 1)  ! Vicodin 2)  ! Codeine 3)  ! Percocet  Orders Added: 1)  Flu Vaccine 51yrs + MEDICARE PATIENTS [Q2039] 2)  Administration Flu vaccine - MCR [G0008]    Flu Vaccine Consent Questions     Do you have a history of severe allergic reactions to this vaccine? no    Any prior history of allergic reactions to egg and/or gelatin? no    Do you have a sensitivity to the preservative Thimersol? no    Do you have a past history of Guillan-Barre Syndrome? no    Do you currently have an acute febrile illness? no    Have you ever had a severe reaction to latex? no    Vaccine information given and explained to patient? yes    Are you currently pregnant? no    Lot Number:[id number]   Exp Date:03/07/2011   Site Given  Left Deltoid IM

## 2010-10-07 NOTE — Assessment & Plan Note (Signed)
Summary: FU--MEDS PER SON/GARY---STC   Vital Signs:  Patient profile:   77 year old female Height:      62 inches Weight:      149 pounds BMI:     27.35 O2 Sat:      97 % on Room air Temp:     97.7 degrees F oral Pulse rate:   77 / minute BP sitting:   146 / 60  (left arm) Cuff size:   regular  Vitals Entered ByZella Ball Ewing (December 30, 2009 4:23 PM)  O2 Flow:  Room air  CC: followup on meds/RE   Primary Care Provider:  Oliver Barre, M.D.  CC:  followup on meds/RE.  History of Present Illness: overall improved;  husband died 2010/04/30and she was markedly depressed as per last visit;  son with her today very supportive;  she and son agree she has remarkably improved with her depressive symtpoms now at least 90% improved, though still working through grieving;  has good compliance and good tolerabilty of the SSRI;  wt up 3 lbs and appetitie improved;  no longer walking with the cane;  does have mild nasal allergy symtpoms and left ear fullness and mild hearing loss for several wks - has had some hearing loss gradually over the past yr as well, has not tried any OTC antihist;  Pt denies CP, sob, doe, wheezing, orthopnea, pnd, worsening LE edema, palps, dizziness or syncope   Pt denies new neuro symptoms such as headache, facial or extremity weakness   Pt denies polydipsia, polyuria, or low sugar symptoms such as shakiness improved with eating.  Overall good compliance with meds, trying to follow low chol, DM diet, little excercise however  Here for wellness Diet: Heart Healthy or DM if diabetic Physical Activities: Sedentary Depression/mood screen: improved as above Hearing: mild decreased left ear as above Visual Acuity: Grossly normal, gets exam yearly ADL's: Capable  Fall Risk: mild at best, improved from 2010 Home Safety: Good Cognitive Impairment:  Gen appearance, affect, speech, memory, attention & motor skills grossly intact End-of-Life Planning: Advance directive - Full  code/I agree   Preventive Screening-Counseling & Management      Drug Use:  no.    Problems Prior to Update: 1)  Allergic Rhinitis  (ICD-477.9) 2)  Back Pain  (ICD-724.5) 3)  Depression  (ICD-311) 4)  Anemia-iron Deficiency  (ICD-280.9) 5)  Need For Other Specified Prophylactic Measure  (ICD-V07.8) 6)  Weight Loss  (ICD-783.21) 7)  Other Dysphagia  (ICD-787.29) 8)  Dehydration  (ICD-276.51) 9)  Dizziness  (ICD-780.4) 10)  Cough  (ICD-786.2) 11)  Constipation  (ICD-564.00) 12)  Esophageal Stricture  (ICD-530.3) 13)  Hiatal Hernia  (ICD-553.3) 14)  Colonic Polyps  (ICD-211.3) 15)  Dyspnea  (ICD-786.05) 16)  Preventive Health Care  (ICD-V70.0) 17)  Fatigue  (ICD-780.79) 18)  Diverticulosis, Colon  (ICD-562.10) 19)  Colonic Polyps, Hx of  (ICD-V12.72) 20)  Osteoporosis  (ICD-733.00) 21)  Hyperlipidemia  (ICD-272.4) 22)  Hypercholesterolemia  (ICD-272.0) 23)  Obesity  (ICD-278.00) 24)  Hypertension  (ICD-401.9) 25)  Gerd  (ICD-530.81) 26)  Diabetes Mellitus, Type II  (ICD-250.00)  Medications Prior to Update: 1)  Aurora Lancet Super Thin 30g  Misc (Lancets) .... Use 1 Stick As Directed Once  A Day 2)  Onetouch Test  Strp (Glucose Blood) .... Use 1 Strip Once A Day 3)  Simvastatin 80 Mg Tabs (Simvastatin) .Marland Kitchen.. 1po Once Daily 4)  Norvasc 10 Mg  Tabs (Amlodipine Besylate) .... Take  1 By Mouth Qd 5)  Lantus 100 Unit/ml  Soln (Insulin Glargine) .... Take 40 Units Subcutaneously Qd 6)  Darvocet-N 100 100-650 Mg Tabs (Propoxyphene N-Apap) .Marland Kitchen.. 1 By Mouth Q 6 Hrs As Needed 7)  Ecotrin Low Strength 81 Mg  Tbec (Aspirin) .Marland Kitchen.. 1po Qd 8)  Metformin Hcl 500 Mg  Tabs (Metformin Hcl) .... 2 By Mouth Two Times A Day 9)  Ferrous Sulfate 324 Mg Tabs (Ferrous Sulfate) .Marland Kitchen.. 1 By Mouth Two Times A Day 10)  Citalopram Hydrobromide 10 Mg Tabs (Citalopram Hydrobromide) .Marland Kitchen.. 1po Once Daily  Current Medications (verified): 1)  Aurora Lancet Super Thin 30g  Misc (Lancets) .... Use 1 Stick As Directed  Once  A Day 2)  Onetouch Test  Strp (Glucose Blood) .... Use 1 Strip Once A Day  250.02 3)  Simvastatin 40 Mg Tabs (Simvastatin) .Marland Kitchen.. 1po Once Daily 4)  Norvasc 10 Mg  Tabs (Amlodipine Besylate) .... Take 1 By Mouth Once Daily 5)  Lantus 100 Unit/ml  Soln (Insulin Glargine) .... Take 40 Units Subcutaneously Once Daily 6)  Ecotrin Low Strength 81 Mg  Tbec (Aspirin) .Marland Kitchen.. 1po Qd 7)  Metformin Hcl 500 Mg  Tabs (Metformin Hcl) .... 2 By Mouth Two Times A Day 8)  Citalopram Hydrobromide 10 Mg Tabs (Citalopram Hydrobromide) .Marland Kitchen.. 1po Once Daily 9)  Claritin 10 Mg Tabs (Loratadine) .Marland Kitchen.. 1po Once Daily As Needed  Allergies (verified): 1)  ! Vicodin 2)  ! Codeine 3)  ! Percocet  Past History:  Past Surgical History: Last updated: 06/03/2009 Appendectomy Hysterectomy Tonsillectomy adenoidectomy left ankle surgery s/p bladder pubovaginal sling s/p cystocele/rectocele repair Laparoscopic takedown of incarcerated stomach within the chest/NISSEN 2009.   Family History: Last updated: 10/24/2007 father with colon cancer, DM mother with heart disease, DM, MI  Social History: Last updated: 12/30/2009 Never Smoked Alcohol use-no widow Daily Caffeine Use husband terminally ill with lung cancer - married x 10yrs - died 04/22/10Drug use-no  Risk Factors: Smoking Status: never (10/24/2007)  Past Medical History: Diabetes mellitus, type II GERD Hypertension Hyperlipidemia Obesity Osteoporosis DM gastroparesis chronic constipation Colonic polyps, hx of - Dr Patterson/GI Diverticulosis, colon nephropathy - DM retinopathy - DM Anemia-iron deficiency Depression Allergic rhinitis  Social History: Reviewed history from 03/22/2009 and no changes required. Never Smoked Alcohol use-no widow Daily Caffeine Use husband terminally ill with lung cancer - married x 17yrs - died April 22, 2010Drug use-no Drug Use:  no  Review of Systems  The patient denies anorexia, fever, weight  loss, vision loss, decreased hearing, hoarseness, chest pain, syncope, dyspnea on exertion, peripheral edema, prolonged cough, headaches, hemoptysis, abdominal pain, melena, hematochezia, severe indigestion/heartburn, hematuria, muscle weakness, suspicious skin lesions, depression, unusual weight change, abnormal bleeding, enlarged lymph nodes, and angioedema.         all otherwise negative per pt -  except for mild fatigue without OSA symtpoms  Physical Exam  General:  alert and well-developed.   Head:  normocephalic and atraumatic.   Eyes:  vision grossly intact, pupils equal, and pupils round.   Ears:  R ear normal.  , left tm mild erythema Nose:  nasal dischargemucosal pallor and mucosal edema.   Mouth:  pharyngeal erythema and fair dentition.   Neck:  supple and no masses.   Lungs:  normal respiratory effort and normal breath sounds.   Heart:  normal rate and regular rhythm.   Abdomen:  soft, non-tender, and normal bowel sounds.   Msk:  no joint tenderness and no joint  swelling.   Extremities:  no edema, no erythema  Neurologic:  cranial nerves II-XII intact and strength normal in all extremities.  except for mild left hearing loss Skin:  color normal and no rashes. , has several onychomycotic nails Psych:  not anxious appearing and not depressed appearing.     Impression & Recommendations:  Problem # 1:  Preventive Health Care (ICD-V70.0)  Overall doing well, age appropriate education and counseling updated and referral for appropriate preventive services done unless declined, immunizations up to date or declined, diet counseling done if overweight, urged to quit smoking if smokes , most recent labs reviewed and current ordered if appropriate, ecg reviewed or declined (interpretation per ECG scanned in the EMR if done); information regarding Medicare Prevention requirements given if appropriate   Orders: First annual wellness visit with prevention plan  (Z6109)  Problem # 2:   ALLERGIC RHINITIS (ICD-477.9)  ok for OTC claritin  Her updated medication list for this problem includes:    Claritin 10 Mg Tabs (Loratadine) .Marland Kitchen... 1po once daily as needed  Problem # 3:  FATIGUE (ICD-780.79) exam benign, to check labs below; follow with expectant management  Orders: TLB-BMP (Basic Metabolic Panel-BMET) (80048-METABOL) TLB-CBC Platelet - w/Differential (85025-CBCD) TLB-Hepatic/Liver Function Pnl (80076-HEPATIC) TLB-TSH (Thyroid Stimulating Hormone) (84443-TSH) TLB-Sedimentation Rate (ESR) (85652-ESR) TLB-IBC Pnl (Iron/FE;Transferrin) (83550-IBC) TLB-B12 + Folate Pnl (60454_09811-B14/NWG)  Problem # 4:  HYPERLIPIDEMIA (ICD-272.4)  Her updated medication list for this problem includes:    Simvastatin 40 Mg Tabs (Simvastatin) .Marland Kitchen... 1po once daily  Labs Reviewed: SGOT: 24 (06/04/2009)   SGPT: 15 (06/04/2009)  Lipid Goals: Chol Goal: 200 (05/10/2009)   HDL Goal: 40 (05/10/2009)   LDL Goal: 100 (05/10/2009)   TG Goal: 150 (05/10/2009)  Prior 10 Yr Risk Heart Disease: 24 % (05/10/2009)   HDL:59.0 (11/09/2008), 54.0 (10/24/2007)  LDL:107 (11/09/2008), DEL (10/24/2007)  Chol:195 (11/09/2008), 212 (10/24/2007)  Trig:145 (11/09/2008), 236 (10/24/2007) stable overall by hx and exam, ok to continue meds/tx as is , Pt to continue diet efforts, good med tolerance; to check labs - goal LDL less than 70   Problem # 5:  DIABETES MELLITUS, TYPE II (ICD-250.00)  Her updated medication list for this problem includes:    Lantus 100 Unit/ml Soln (Insulin glargine) .Marland Kitchen... Take 40 units subcutaneously once daily    Ecotrin Low Strength 81 Mg Tbec (Aspirin) .Marland Kitchen... 1po qd    Metformin Hcl 500 Mg Tabs (Metformin hcl) .Marland Kitchen... 2 by mouth two times a day  Orders: TLB-Lipid Panel (80061-LIPID) TLB-Microalbumin/Creat Ratio, Urine (82043-MALB) TLB-A1C / Hgb A1C (Glycohemoglobin) (83036-A1C)  Labs Reviewed: Creat: 1.5 (06/04/2009)    Reviewed HgBA1c results: 8.1 (11/09/2008)  7.6  (10/24/2007) recently uncontrolled overall as above, wtih recent wt loss - to check labs, cont diet anad wt control efforts, cont meds as is  Problem # 6:  ANEMIA-IRON DEFICIENCY (ICD-280.9)  The following medications were removed from the medication list:    Ferrous Sulfate 324 Mg Tabs (Ferrous sulfate) .Marland Kitchen... 1 by mouth two times a day to re-check labs today, sept iron panel pattern c/w chronic dz, to stop the iron  Problem # 7:  COLONIC POLYPS (ICD-211.3) due for f/u Orders: Gastroenterology Referral (GI)  Problem # 8:  HYPERTENSION (ICD-401.9)  Her updated medication list for this problem includes:    Norvasc 10 Mg Tabs (Amlodipine besylate) .Marland Kitchen... Take 1 by mouth once daily for some reason , not takiing the amlodipione - to re-start   Orders: Prescription Created Electronically [phone])  BP today:  146/60 Prior BP: 138/60 (06/04/2009)  Prior 10 Yr Risk Heart Disease: 24 % (05/10/2009)  Labs Reviewed: K+: 4.0 (06/04/2009) Creat: : 1.5 (06/04/2009)   Chol: 195 (11/09/2008)   HDL: 59.0 (11/09/2008)   LDL: 107 (11/09/2008)   TG: 145 (11/09/2008)  Problem # 9:  DEPRESSION (ICD-311)  Her updated medication list for this problem includes:    Citalopram Hydrobromide 10 Mg Tabs (Citalopram hydrobromide) .Marland Kitchen... 1po once daily improved, to cont SSRI for at least 6 more mo  Complete Medication List: 1)  Aurora Lancet Super Thin 30g Misc (Lancets) .... Use 1 stick as directed once  a day 2)  Onetouch Test Strp (Glucose blood) .... Use 1 strip once a day  250.02 3)  Simvastatin 40 Mg Tabs (Simvastatin) .Marland Kitchen.. 1po once daily 4)  Norvasc 10 Mg Tabs (Amlodipine besylate) .... Take 1 by mouth once daily 5)  Lantus 100 Unit/ml Soln (Insulin glargine) .... Take 40 units subcutaneously once daily 6)  Ecotrin Low Strength 81 Mg Tbec (Aspirin) .Marland Kitchen.. 1po qd 7)  Metformin Hcl 500 Mg Tabs (Metformin hcl) .... 2 by mouth two times a day 8)  Citalopram Hydrobromide 10 Mg Tabs (Citalopram hydrobromide)  .Marland Kitchen.. 1po once daily 9)  Claritin 10 Mg Tabs (Loratadine) .Marland Kitchen.. 1po once daily as needed  Patient Instructions: 1)  [facility] to[facility] 2)  Please go to the Lab in the basement for your blood and/or urine tests today 3)  You will be contacted about the referral(s) to: colonoscopy 4)  please call the ENT for the hearing loss, as you need 5)  all of your prescriptions were sent to the pharmacy 6)  you can also take OTC Claritin for the allergies 7)  Please schedule a follow-up appointment in 6 months. Prescriptions: CITALOPRAM HYDROBROMIDE 10 MG TABS (CITALOPRAM HYDROBROMIDE) 1po once daily  #90 x 3   Entered and Authorized by:   Corwin Levins MD   Signed by:   Corwin Levins MD on 12/30/2009   Method used:   Electronically to        [facility] (retail)       [address]       [place], [place]  [address]       Ph: [phone]       Fax: [phone]   RxID:   [id number] METFORMIN HCL 500 MG  TABS (METFORMIN HCL) 2 by mouth two times a day  #360 x 3   Entered and Authorized by:   Corwin Levins MD   Signed by:   Corwin Levins MD on 12/30/2009   Method used:   Electronically to        [facility] (retail)       [address]       [place], [place]  [address]       Ph: [phone]       Fax: [phone]   RxID:   [id number] LANTUS 100 UNIT/ML  SOLN (INSULIN GLARGINE) take 40 units subcutaneously once daily  #1 box x 11   Entered and Authorized by:   Corwin Levins MD   Signed by:   Corwin Levins MD on 12/30/2009   Method used:   Electronically to        [facility] (retail)       [address]  [address]       [place], [place]  [address]       Ph: [phone]       Fax: [phone]   RxID:   [id number] NORVASC 10 MG  TABS (AMLODIPINE BESYLATE) TAKE 1 by mouth once daily  #90 x 3   Entered and Authorized by:   Corwin Levins MD   Signed by:   Corwin Levins MD on  12/30/2009   Method used:   Electronically to        [facility] (retail)       [address]       [place], [place]  [address]       Ph: [phone]       Fax: [phone]   RxID:   [id number] Letta Pate TEST  STRP (GLUCOSE BLOOD) Use 1 strip once a day  250.02  #100 x 11   Entered and Authorized by:   Corwin Levins MD   Signed by:   Corwin Levins MD on 12/30/2009   Method used:   Electronically to        [facility] (retail)       [address]       [place], [place]  [address]       Ph: [phone]       Fax: [phone]   RxID:   [id number] AURORA LANCET SUPER THIN 30G  MISC (LANCETS) Use 1 stick as directed once  a day  #100 x 11   Entered and Authorized by:   Corwin Levins MD   Signed by:   Corwin Levins MD on 12/30/2009   Method used:   Electronically to        [facility] (retail)       [address]       [place], [place]  [address]       Ph: [phone]       Fax: [phone]   RxID:   [id number] SIMVASTATIN 40 MG TABS (SIMVASTATIN) 1po once daily  #90 x 3   Entered and Authorized by:   Corwin Levins MD   Signed by:   Corwin Levins MD on 12/30/2009   Method used:   Electronically to        [facility] (retail)       [address]       [place], [place]  [address]       Ph: [phone]       Fax: [phone]   RxID:   [id number]

## 2010-10-07 NOTE — Letter (Signed)
Summary: Referral - not able to see patient  [facility]  [address] [place], [place] [address]   Phone: [phone]  Fax: [phone]    February 06, 2010   Corwin Levins, M.D. [address] [place], [place] [address]   Re:   [name] DOB:  1931/01/27 MRN:   [id number]    Dear Dr. Jonny Ruiz:  Thank you for your kind referral of the above patient.  We have attempted to schedule the recommended procedure Screening Colonoscopy but have not been able to schedule because:  ___ The patient was not available by phone and/or has not returned our calls.   X  The patient declined to schedule the procedure at this time.  We appreciate the referral and hope that we will have the opportunity to treat this patient in the future.    Sincerely,    [facility] [phone]

## 2010-12-13 LAB — GLUCOSE, CAPILLARY
Glucose-Capillary: 116 mg/dL — ABNORMAL HIGH (ref 70–99)
Glucose-Capillary: 146 mg/dL — ABNORMAL HIGH (ref 70–99)
Glucose-Capillary: 263 mg/dL — ABNORMAL HIGH (ref 70–99)

## 2010-12-13 LAB — URINALYSIS, ROUTINE W REFLEX MICROSCOPIC
Bilirubin Urine: NEGATIVE
Glucose, UA: NEGATIVE mg/dL
Hgb urine dipstick: NEGATIVE
Ketones, ur: NEGATIVE mg/dL
Nitrite: NEGATIVE
Protein, ur: NEGATIVE mg/dL
Specific Gravity, Urine: 1.004 — ABNORMAL LOW (ref 1.005–1.030)
Urobilinogen, UA: 0.2 mg/dL (ref 0.0–1.0)
pH: 6 (ref 5.0–8.0)

## 2010-12-13 LAB — COMPREHENSIVE METABOLIC PANEL
ALT: 15 U/L (ref 0–35)
AST: 25 U/L (ref 0–37)
Albumin: 3.6 g/dL (ref 3.5–5.2)
Alkaline Phosphatase: 43 U/L (ref 39–117)
BUN: 12 mg/dL (ref 6–23)
CO2: 26 mEq/L (ref 19–32)
Calcium: 9.1 mg/dL (ref 8.4–10.5)
Chloride: 104 mEq/L (ref 96–112)
Creatinine, Ser: 0.96 mg/dL (ref 0.4–1.2)
GFR calc Af Amer: >60 mL/min (ref >60)
GFR calc non Af Amer: 57 mL/min — ABNORMAL LOW (ref >60)
Glucose, Bld: 140 mg/dL — ABNORMAL HIGH (ref 70–99)
Potassium: 3.8 mEq/L (ref 3.5–5.1)
Sodium: 135 mEq/L (ref 135–145)
Total Bilirubin: 0.5 mg/dL (ref 0.3–1.2)
Total Protein: 7.1 g/dL (ref 6.0–8.3)

## 2010-12-13 LAB — BASIC METABOLIC PANEL
CO2: 24 mEq/L (ref 19–32)
Calcium: 8.3 mg/dL — ABNORMAL LOW (ref 8.4–10.5)
Chloride: 107 mEq/L (ref 96–112)
Creatinine, Ser: 0.81 mg/dL (ref 0.4–1.2)
GFR calc Af Amer: >60 mL/min (ref >60)
GFR calc non Af Amer: >60 mL/min (ref >60)
Glucose, Bld: 122 mg/dL — ABNORMAL HIGH (ref 70–99)
Glucose, Bld: 188 mg/dL — ABNORMAL HIGH (ref 70–99)
Sodium: 137 mEq/L (ref 135–145)
Sodium: 139 mEq/L (ref 135–145)

## 2010-12-13 LAB — CARDIAC PANEL(CRET KIN+CKTOT+MB+TROPI)
CK, MB: 1.3 ng/mL (ref 0.3–4.0)
Relative Index: 1 (ref 0.0–2.5)
Total CK: 104 U/L (ref 7–177)
Total CK: 128 U/L (ref 7–177)
Troponin I: 0.02 ng/mL (ref 0.00–0.06)

## 2010-12-13 LAB — IRON AND TIBC
Saturation Ratios: 12 % — ABNORMAL LOW (ref 20–55)
TIBC: 258 ug/dL (ref 250–470)

## 2010-12-13 LAB — FOLATE: Folate: 12.4 ng/mL

## 2010-12-13 LAB — DIFFERENTIAL
Basophils Relative: 0 % (ref 0–1)
Lymphocytes Relative: 20 % (ref 12–46)
Monocytes Relative: 6 % (ref 3–12)
Neutro Abs: 8.1 10*3/uL — ABNORMAL HIGH (ref 1.7–7.7)
Neutrophils Relative %: 73 % (ref 43–77)

## 2010-12-13 LAB — POCT CARDIAC MARKERS
CKMB, poc: 1.3 ng/mL (ref 1.0–8.0)
Myoglobin, poc: 118 ng/mL (ref 12–200)
Troponin i, poc: <0.05 ng/mL (ref 0.00–0.09)

## 2010-12-13 LAB — BRAIN NATRIURETIC PEPTIDE: Pro B Natriuretic peptide (BNP): 61 pg/mL (ref 0.0–100.0)

## 2010-12-13 LAB — RETICULOCYTES
RBC.: 3.18 MIL/uL — ABNORMAL LOW (ref 3.87–5.11)
Retic Count, Absolute: 22.3 10*3/uL (ref 19.0–186.0)

## 2010-12-13 LAB — URINE MICROSCOPIC-ADD ON

## 2010-12-13 LAB — URINE CULTURE
Colony Count: 75000
Special Requests: NEGATIVE

## 2010-12-13 LAB — TSH: TSH: 1.26 u[IU]/mL (ref 0.350–4.500)

## 2010-12-13 LAB — CBC
HCT: 30.9 % — ABNORMAL LOW (ref 36.0–46.0)
Hemoglobin: 10.4 g/dL — ABNORMAL LOW (ref 12.0–15.0)
MCHC: 33.7 g/dL (ref 30.0–36.0)
MCV: 89.2 fL (ref 78.0–100.0)
Platelets: 237 10*3/uL (ref 150–400)
RBC: 3.47 MIL/uL — ABNORMAL LOW (ref 3.87–5.11)
RDW: 13.7 % (ref 11.5–15.5)
WBC: 11.1 10*3/uL — ABNORMAL HIGH (ref 4.0–10.5)

## 2010-12-13 LAB — FERRITIN: Ferritin: 40 ng/mL (ref 10–291)

## 2010-12-13 LAB — CK TOTAL AND CKMB (NOT AT ARMC): CK, MB: 1.6 ng/mL (ref 0.3–4.0)

## 2010-12-14 LAB — CBC
HCT: 37 % (ref 36.0–46.0)
MCHC: 33.8 g/dL (ref 30.0–36.0)
MCV: 88.5 fL (ref 78.0–100.0)
Platelets: 188 10*3/uL (ref 150–400)
RDW: 13.7 % (ref 11.5–15.5)

## 2010-12-14 LAB — GLUCOSE, CAPILLARY
Glucose-Capillary: 123 mg/dL — ABNORMAL HIGH (ref 70–99)
Glucose-Capillary: 147 mg/dL — ABNORMAL HIGH (ref 70–99)
Glucose-Capillary: 152 mg/dL — ABNORMAL HIGH (ref 70–99)
Glucose-Capillary: 163 mg/dL — ABNORMAL HIGH (ref 70–99)
Glucose-Capillary: 170 mg/dL — ABNORMAL HIGH (ref 70–99)
Glucose-Capillary: 176 mg/dL — ABNORMAL HIGH (ref 70–99)
Glucose-Capillary: 179 mg/dL — ABNORMAL HIGH (ref 70–99)
Glucose-Capillary: 185 mg/dL — ABNORMAL HIGH (ref 70–99)

## 2010-12-14 LAB — URINE CULTURE
Culture: NO GROWTH
Special Requests: NEGATIVE

## 2010-12-14 LAB — HEPATIC FUNCTION PANEL
Bilirubin, Direct: 0.1 mg/dL (ref 0.0–0.3)
Indirect Bilirubin: 0.8 mg/dL (ref 0.3–0.9)

## 2010-12-14 LAB — CULTURE, BLOOD (ROUTINE X 2): Culture: NO GROWTH

## 2010-12-14 LAB — HEMOGLOBIN A1C
Hgb A1c MFr Bld: 7.1 % — ABNORMAL HIGH (ref 4.6–6.1)
Mean Plasma Glucose: 157 mg/dL

## 2010-12-14 LAB — BASIC METABOLIC PANEL
BUN: 10 mg/dL (ref 6–23)
Chloride: 102 mEq/L (ref 96–112)
GFR calc non Af Amer: 55 mL/min — ABNORMAL LOW (ref >60)
Glucose, Bld: 177 mg/dL — ABNORMAL HIGH (ref 70–99)
Potassium: 4 mEq/L (ref 3.5–5.1)

## 2010-12-28 ENCOUNTER — Encounter: Payer: Self-pay | Admitting: Internal Medicine

## 2010-12-28 DIAGNOSIS — Z0000 Encounter for general adult medical examination without abnormal findings: Secondary | ICD-10-CM | POA: Insufficient documentation

## 2010-12-30 ENCOUNTER — Other Ambulatory Visit (INDEPENDENT_AMBULATORY_CARE_PROVIDER_SITE_OTHER): Payer: Medicare Other | Admitting: Internal Medicine

## 2010-12-30 ENCOUNTER — Other Ambulatory Visit (INDEPENDENT_AMBULATORY_CARE_PROVIDER_SITE_OTHER): Payer: Medicare Other

## 2010-12-30 ENCOUNTER — Ambulatory Visit (INDEPENDENT_AMBULATORY_CARE_PROVIDER_SITE_OTHER): Payer: Medicare Other | Admitting: Internal Medicine

## 2010-12-30 ENCOUNTER — Encounter: Payer: Self-pay | Admitting: Internal Medicine

## 2010-12-30 VITALS — BP 132/60 | HR 78 | Temp 98.5°F

## 2010-12-30 DIAGNOSIS — E785 Hyperlipidemia, unspecified: Secondary | ICD-10-CM

## 2010-12-30 DIAGNOSIS — E119 Type 2 diabetes mellitus without complications: Secondary | ICD-10-CM

## 2010-12-30 DIAGNOSIS — R0989 Other specified symptoms and signs involving the circulatory and respiratory systems: Secondary | ICD-10-CM

## 2010-12-30 DIAGNOSIS — I10 Essential (primary) hypertension: Secondary | ICD-10-CM

## 2010-12-30 DIAGNOSIS — R0609 Other forms of dyspnea: Secondary | ICD-10-CM

## 2010-12-30 DIAGNOSIS — Z13220 Encounter for screening for lipoid disorders: Secondary | ICD-10-CM

## 2010-12-30 LAB — CBC WITH DIFFERENTIAL/PLATELET
Basophils Relative: 0.5 % (ref 0.0–3.0)
Eosinophils Relative: 1.2 % (ref 0.0–5.0)
Hemoglobin: 10.7 g/dL — ABNORMAL LOW (ref 12.0–15.0)
Lymphocytes Relative: 34.8 % (ref 12.0–46.0)
Neutrophils Relative %: 56.1 % (ref 43.0–77.0)
RBC: 3.49 Mil/uL — ABNORMAL LOW (ref 3.87–5.11)
WBC: 6.4 10*3/uL (ref 4.5–10.5)

## 2010-12-30 LAB — HEMOGLOBIN A1C: Hgb A1c MFr Bld: 7.7 % — ABNORMAL HIGH (ref 4.6–6.5)

## 2010-12-30 LAB — BASIC METABOLIC PANEL
BUN: 19 mg/dL (ref 6–23)
Calcium: 9.9 mg/dL (ref 8.4–10.5)
Chloride: 104 mEq/L (ref 96–112)
Creatinine, Ser: 1.2 mg/dL (ref 0.4–1.2)

## 2010-12-30 LAB — LIPID PANEL
Cholesterol: 264 mg/dL — ABNORMAL HIGH (ref 0–200)
HDL: 71.2 mg/dL (ref >39.00)
Triglycerides: 86 mg/dL (ref 0.0–149.0)
VLDL: 17.2 mg/dL (ref 0.0–40.0)

## 2010-12-30 LAB — HEPATIC FUNCTION PANEL
ALT: 16 U/L (ref 0–35)
Total Bilirubin: 0.7 mg/dL (ref 0.3–1.2)
Total Protein: 7.3 g/dL (ref 6.0–8.3)

## 2010-12-30 MED ORDER — INSULIN GLARGINE 100 UNIT/ML ~~LOC~~ SOLN: 23.0000 [IU] | Freq: Every day | SUBCUTANEOUS | Status: DC

## 2010-12-30 MED ORDER — SIMVASTATIN 40 MG PO TABS: 40.0000 mg | ORAL_TABLET | Freq: Every day | ORAL | Status: DC

## 2010-12-30 MED ORDER — INSULIN GLARGINE 100 UNIT/ML ~~LOC~~ SOLN: 40.0000 [IU] | Freq: Every day | SUBCUTANEOUS | Status: DC

## 2010-12-30 MED ORDER — METFORMIN HCL 500 MG PO TABS: 1000.0000 mg | ORAL_TABLET | Freq: Two times a day (BID) | ORAL | Status: DC

## 2010-12-30 NOTE — Patient Instructions (Addendum)
Continue all other medications as before, including the simvastatin Your EKG was ok today - no acute changes Please return in 3 wks , or sooner if needed, to go over test results

## 2010-12-30 NOTE — Assessment & Plan Note (Signed)
stable overall by hx and exam, most recent lab reviewed with pt, and pt to continue medical treatment as before  BP Readings from Last 3 Encounters:  12/30/10 132/60  08/15/10 132/70  12/30/09 146/60

## 2010-12-30 NOTE — Assessment & Plan Note (Signed)
stable overall by hx and exam, most recent lab reviewed with pt, and pt to continue medical treatment as before  Lab Results  Component Value Date   HGBA1C 6.6* 12/30/2009

## 2010-12-30 NOTE — Assessment & Plan Note (Signed)
Pt has been off the statin to try diet;  Ok to re-start with lipid check today  Lab Results  Component Value Date   [facility]  Value: 96        Total Cholesterol/HDL:CHD Risk Coronary Heart Disease Risk Table                     Men   Women  1/2 Average Risk   3.4   3.3  Average Risk       5.0   4.4  2 X Average Risk   9.6   7.1  3 X Average Risk  23.4   11.0        Use the calculated Patient Ratio above and the CHD Risk Table to determine the patient's CHD Risk.        ATP III CLASSIFICATION (LDL):  <100     mg/dL   Optimal  161-096  mg/dL   Near or Above                    Optimal  130-159  mg/dL   Borderline  045-409  mg/dL   High  >811     mg/dL   Very High 05/21/7828

## 2010-12-30 NOTE — Assessment & Plan Note (Signed)
Exam benign except for mild overwt and deconditioning;  Has mild non prod cough of unclear clinical significance;  No overt wheezing/chf/pna or other at this time;  Will check labs/ecg/cxr/pft/echo with f/u 3 wks

## 2011-01-04 ENCOUNTER — Encounter: Payer: Self-pay | Admitting: Internal Medicine

## 2011-01-04 NOTE — Progress Notes (Signed)
Subjective:    Patient ID: [name], female    DOB: [birth year], 77 y.o.   MRN: [id number]  HPI Here to f/u; overall doing ok,  Pt denies chest pain, wheezing, orthopnea, PND, increased LE swelling, palpitations, dizziness or syncope.  Pt denies new neurological symptoms such as new headache, or facial or extremity weakness or numbness   Pt denies polydipsia, polyuria, or low sugar symptoms such as weakness or confusion improved with po intake.  Pt states overall good compliance with meds, trying to follow lower cholesterol, diabetic diet, wt overall stable but little exercise however.  Does have unsual DOE gradually increased in the past 2-3 months, now having to walk slowly in from the parking lot or she would have to stop to rest.   Pt denies fever, wt loss, night sweats, loss of appetite, or other constitutional symptoms  Overall good compliance with treatment, and good medicine tolerability.  Denies worsening depressive symptoms, suicidal ideation, or panic, though has ongoing anxiety, not increased recently.   No other new complaints Past Medical History  Diagnosis Date  . COLONIC POLYPS 06/15/2005  . DIABETES MELLITUS, TYPE II 04/22/2007  . HYPERCHOLESTEROLEMIA 04/22/2007  . HYPERLIPIDEMIA 05/04/2007  . Dehydration 03/22/2009  . OBESITY 04/22/2007  . ANEMIA-IRON DEFICIENCY 05/10/2009  . DEPRESSION 05/10/2009  . HYPERTENSION 04/22/2007  . ALLERGIC RHINITIS 12/30/2009  . ESOPHAGEAL STRICTURE 05/28/2004  . GERD 04/22/2007  . HIATAL HERNIA 05/28/2004  . DIVERTICULOSIS, COLON 10/24/2007  . CONSTIPATION 01/26/2008  . BACK PAIN 05/28/2009  . OSTEOPOROSIS 05/04/2007  . DIZZINESS 03/22/2009  . FATIGUE 10/24/2007  . WEIGHT LOSS 03/22/2009  . DYSPNEA 12/28/2007  . Other dysphagia 03/22/2009  . COLONIC POLYPS, HX OF 10/24/2007   Past Surgical History  Procedure Date  . Appendectomy   . Abdominal hysterectomy   . Tonsillectomy   . Adenoidectomy   . Left ankle surgury   . S/p bladder pubovaginal sling     . S/p cystocele/rectocele repair   . Laparoscopic takedown of incarcerated stomach within the chest/nissen 2009    reports that she has never smoked. She does not have any smokeless tobacco history on file. She reports that she does not drink alcohol or use illicit drugs. family history includes Cancer in her father; Diabetes in her father and mother; Heart attack in her mother; and Heart disease in her mother. Allergies  Allergen Reactions  . Codeine   . Hydrocodone-Acetaminophen   . Oxycodone-Acetaminophen    Current Outpatient Prescriptions on File Prior to Visit  Medication Sig Dispense Refill  . aspirin 81 MG EC tablet Take 81 mg by mouth daily.        Jerrell Belfast Lancet Super Thin 30G MISC Use 1 stick as directed once daily       . omeprazole (PRILOSEC) 40 MG capsule Take 40 mg by mouth daily.         Review of Systems Review of Systems  Constitutional: Negative for diaphoresis and unexpected weight change.  HENT: Negative for drooling and tinnitus.   Eyes: Negative for photophobia and visual disturbance.  Respiratory: Negative for choking and stridor.   Gastrointestinal: Negative for vomiting and blood in stool.  Genitourinary: Negative for hematuria and decreased urine volume.  Musculoskeletal: Negative for gait problem.  Skin: Negative for color change and wound.  Neurological: Negative for tremors and numbness.  Psychiatric/Behavioral: Negative for decreased concentration. The patient is not hyperactive.       Objective:   Physical Exam BP 132/60  Pulse  78  Temp(Src) 98.5 F (36.9 C) (Oral)  SpO2 94% Physical Exam  VS noted Constitutional: Pt appears well-developed and well-nourished.  HENT: Head: Normocephalic.  Right Ear: External ear normal.  Left Ear: External ear normal.  Eyes: Conjunctivae and EOM are normal. Pupils are equal, round, and reactive to light.  Neck: Normal range of motion. Neck supple.  Cardiovascular: Normal rate and regular rhythm.    Pulmonary/Chest: Effort normal and breath sounds normal.  Abd:  Soft, NT, non-distended, + BS Neurological: Pt is alert. No cranial nerve deficit.  Skin: Skin is warm. No erythema.  Psychiatric: Pt behavior is normal. Thought content normal.         Assessment & Plan:

## 2011-01-08 ENCOUNTER — Other Ambulatory Visit (HOSPITAL_COMMUNITY): Payer: PRIVATE HEALTH INSURANCE | Admitting: Radiology

## 2011-01-15 ENCOUNTER — Ambulatory Visit: Payer: PRIVATE HEALTH INSURANCE | Admitting: Internal Medicine

## 2011-01-20 NOTE — Discharge Summary (Signed)
[name], KETTER NO.:  [id number]   MEDICAL RECORD NO.:  [id number]          PATIENT TYPE:  INP   LOCATION:  1502                         FACILITY:  [facility]   PHYSICIAN:  Corwin Levins, MD      DATE OF BIRTH:  August 21, 1931   DATE OF ADMISSION:  03/22/2009  DATE OF DISCHARGE:                               DISCHARGE SUMMARY   DATE OF DISCHARGE:  Anticipated date of discharge March 25, 2009.   DISCHARGE DIAGNOSES:  1. Cough of probable viral illness.  2. Nausea and vomiting with dehydration.  3. Hypertension.  4. Diabetes mellitus.  5. Dysphagia, finishing the work up today.  6. Weight loss.  7. Grief reaction.  8. History of esophageal stricture.  9. History of colon polyps.  10.Diverticulosis.  11.Osteoporosis.  12.Hyperlipidemia.  13.Gastroesophageal reflux disease.   PROCEDURES:  Barium swallow to be done March 25, 2009, not yet performed  but planned.   CONSULTATIONS:  Gastroenterology.   HISTORY OF PRESENT ILLNESS:  Please see the history and physical  dictated on the EMR on the date of admission by myself.   HOSPITAL COURSE:  [name] is a 77 year old white female whose  husband unfortunately died 2 months ago and she does live alone.  She  has family in the area and has basically refused much attention until  about a week prior to admission when she developed low grade temperature  associated with nausea and vomiting and cough.  She became weaker and  the night prior to admission she did stay the night at the daughter-in-  law's home as they were concerned about her.  The morning of admission  she was weak, staggering, dizzy and light headed with persistent nausea  and vomiting, productive cough and required admission for further  evaluation and management.  It was noted at that time her white blood  cell count was 6.5, hemoglobin 12.5, BMET within normal limits except  for glucose of 177, liver function tests within normal limits,  hemoglobin A1C was 7.1. Blood and urine cultures were obtained and are  still pending at the time for discharge as is Legionella antigen, Strep  pneumonia urinary antigen as well.  Sputum Gram stain and sputum culture  apparently were not able to be obtained.  The patient was treated with  IV antibiotics and IV fluids to which she has responded quite nicely  over the last several days as well as reinstitution of her blood  pressure and diabetic regimen she has at home.  Gastroenterology was  asked to see her on March 24, 2009, whose impression was a recurrence of  dysphagia after Nissen and history of previous poor peristalsis.  Their  recommendation was to obtain a barium swallow on March 25, 2009 with  further recommendations to be made pending results.  There is some  question of need for EGD but this is not clear.  On the morning of  dictation, she is much improved since I last saw her.  She has no  further dizziness, nausea, vomiting or abdominal discomfort.  There is  some cough but nonproductive.  No further dizziness, staggering and  clearly does not need physical therapy or occupational therapy at this  time.  She is off antibiotics at this time, for the last 24 hours and  remains afebrile.  She has been NPO after midnight, still on IV fluids  for that.  She feels she is ready to go home if things turn out okay  today. Her CBG's have been in the mid 150s and exam today is essentially  benign with abdomen soft, nontender, positive bowel sounds.  Cardiac  exam has regular rhythm and her chest is clear to auscultation.  Blood  pressure is slightly high at 146/75 but she is afebrile.  Oxygen  saturation is 94%.   DISPOSITION:  Likely/possible discharge home later today pending results  of barium swallow and gastroenterology recommendations.  She will  followup with myself in 1 to 2 weeks and gastroenterology as per their  recommendations.  She will be discharged on previous home  medications to  include:   DISCHARGE MEDICATIONS:  1. Hydrochlorothiazide 25 mg one p.o. daily.  2. Benazepril 40 mg p.o. daily.  3. Fosamax 70 mg  p.o. daily.  4. Amaryl 4 mg p.o. b.i.d.  5. Simvastatin 80 mg p.o. daily.  6. Norvasc 10 mg p.o. daily.  7. Lantus 40 units subcutaneously daily.  8. Ibuprofen 200 mg p.r.n.  9. Ecotrin 81 mg one p.o. daily.  10.Metformin 500 mg, two p.o. b.i.d.      Corwin Levins, MD  Electronically Signed     JWJ/MEDQ  D:  03/25/2009  T:  03/25/2009  Job:  [id number]

## 2011-01-20 NOTE — Op Note (Signed)
[name], [name]             ACCOUNT NO.:  [id number]   MEDICAL RECORD NO.:  [id number]          PATIENT TYPE:  INP   LOCATION:  0001                         FACILITY:  [facility]   PHYSICIAN:  Thornton Park. Daphine Deutscher, MD  DATE OF BIRTH:  [birth year]   DATE OF PROCEDURE:  04/20/2008  DATE OF DISCHARGE:                               OPERATIVE REPORT   PREOPERATIVE INDICATIONS:  [name] is a 77 year old Caucasian  female who has had worsening of heartburn, reflux and difficulty with  breathing.  She was seen in the office on March 05, 2008 regarding  laparoscopic Nissen and repair of hiatal hernia.  She is brought to the  OR at this time for that procedure.   SURGEON:  Thornton Park. Daphine Deutscher, MD   ASSISTANT:  Leonie Man, M.D.   PROCEDURES:  1. Laparoscopic takedown of incarcerated stomach within the chest.  2. Laparoscopic repair of hiatus with 3 pledgeted sutures posteriorly.  3. Upper endoscopy to verify anatomy.  4. Nissen fundoplication over a #50 lighted bougie.   DESCRIPTION OF PROCEDURE:  [name] was taken to room 1 on  Friday, April 20, 2008 and given general anesthesia.  The abdomen was  prepped with Techni-Care and draped sterilely.  I entered the abdomen  through the left upper quadrant using a 0-degree scope without  difficulty, insufflated the abdomen and placed the standard trocars.  Scissors were used to take down some adhesions from her previous midline  incision.   With a Nathanson retractor, providing a retraction of the liver, I then  grasped the stomach with a Babcock clamp and brought into the abdomen.  I then began dissection with a harmonic scalpel.  I took down the sac  and in so doing freed the stomach kept it below the diaphragm.  I then  did further mobilization up in the chest, to relieve it of the numerous  adhesions that were holding it there.  A Penrose drain was placed around  the stomach for traction; this aided in this complete  dissection.  Prior  to repairing the diaphragm, I went ahead to the head of the table and  passed the endoscope and identified the GE junction.  I felt that I had  adequate length into the abdomen.   At that point I went ahead and I placed 3 sutures, using pledgets  posteriorly to approximate the right and left crus.  I got good  visualization of this.   The #50 lighted bougie was then passed by Dr. Raymon Mutton, and around that I did  a wrap with 3 sutures; doing all 3 with tie knots.  A nice healthy wrap  was present.  The bougie was removed; Tisseel was applied underneath the  wrap and on  the wrap.  The retractors were withdrawn.  The port sites were injected  with Marcaine and the abdomen was deflated.  Wounds were closed with 4-0  Vicryl, with Benzoin and Steri-Strips on the skin.  The patient seemed  to tolerate the procedure well.  She was taken to the recovery room in  satisfactory condition.  Thornton Park Daphine Deutscher, MD  Electronically Signed     MBM/MEDQ  D:  04/20/2008  T:  04/20/2008  Job:  [id number]   cc:   Vania Rea. Jarold Motto, MD, FACG, FACP, FAGA  [address]  [place]  [place] [address]   Leslye Peer, MD  [address]  [place], [place] [address]   Corwin Levins, MD  [address]  [place]  [place] [address]

## 2011-01-20 NOTE — Assessment & Plan Note (Signed)
[facility]                            CARDIOLOGY OFFICE NOTE   NAME:[name], Leicht                    MRN:          [id number]  DATE:12/06/2007                            DOB:          1930-12-19    PRIMARY:  Dr. Oliver Barre.   REASON FOR PRESENTATION:  Evaluate the patient with dyspnea.   HISTORY OF PRESENT ILLNESS:  The patient returns for followup of her  dyspnea.  I sent her for an echocardiogram which demonstrated an EF 65%  with some mildly increased left ventricular wall thickness, but no other  significant evidence for diastolic dysfunction.  She had a normal BNP  level.  She had a stress perfusion study which demonstrated no evidence  of ischemia with a well-preserved ejection fraction.  Therefore, I do  not believe this to be an anginal equivalent or indicative of  obstructive coronary disease.  I brought her back today to review this.  Again, she describes rather sudden onset dyspnea with exertion over the  last 3 to 4 months.  This is quite limiting to her.  She is not  describing resting shortness of breath.  She is not having any chest  pain.   PAST MEDICAL HISTORY:  See previous note.   PHYSICAL EXAM:  I performed a limited exam.  VITAL SIGNS:  142/60, heart rate 80 and regular, weight 177 pounds.  LUNGS:  Clear to auscultation bilaterally.  NECK:  No jugular distention.  HEART:  PMI not displaced or sustained, S1-S2 within normal so no S3, no  S4, no murmurs.  EXTREMITIES:  2+ pulses without edema.   ASSESSMENT/PLAN:  1. Dyspnea.  This is the patient's one complaint.  Given the above      workup, I do not believe there to be a cardiac etiology.  There is      still a small possibility of balanced ischemia given her risk      factors of diabetes and hypertension.  There is, in my mind, even a      smaller possibility that this is diastolic dysfunction.  I think      this is unlikely with a normal BNP. The next step will be  to refer      her for pulmonary evaluation.  I will defer to Dr. Delton Coombes.  2. Followup will be as needed.     Rollene Rotunda, MD, [facility]  Electronically Signed    JH/MedQ  DD: 12/06/2007  DT: 12/06/2007  Job #: [id number]   cc:   Leslye Peer, MD

## 2011-01-20 NOTE — H&P (Signed)
[name], [name] NO.:  [id number]   MEDICAL RECORD NO.:  [id number]          PATIENT TYPE:  INP   LOCATION:  0101                         FACILITY:  [facility]   PHYSICIAN:  Ramiro Harvest, MD    DATE OF BIRTH:  [birth year]   DATE OF ADMISSION:  05/02/2009  DATE OF DISCHARGE:                              HISTORY & PHYSICAL   PRIMARY CARE PHYSICIAN:  Dr. Jonny Ruiz of [facility].   CARDIOLOGIST:  Dr. Antoine Poche of [facility].   CONSULTATIONS ON THE CASE:  [facility].   HISTORY OF PRESENT ILLNESS:  [name] is a 77 year old white  female with history of hypertension, diabetes, hyperlipidemia, status  post Nissen fundoplication presented to the ED with a fall and probable  syncopal episode.  The patient states that she was outside on the deck  and cannot quite remember anything, and fell on her left side and was  complaining of pain.  The patient feels that she likely passed out.  The  patient called her son who was out of town who called his wife who is  the patient's daughter-in-law who came, home noticed the patient was  disoriented and confused and complained of left-sided pain and groin  pain and thus called 9-1-1.  The patient denied any fevers, no cough, no  nausea or vomiting, no abdominal pain, no dysuria, no diarrhea, no focal  neurological symptoms.  The patient does endorse decreased p.o. intake,  also global weakness, chills, constipation and nausea.  The patient also  complaining of some left substernal chest pain described as an aching  pain and nonradiating over the past.  The patient was seen in the ED x-  rays and CT done were negative for any acute fractures.  EKG showed a  new left bundle branch block.  Comprehensive metabolic profile was  within normal limits.  CBC within normal limits.  White count of 11.1,  hemoglobin of 10.4, ANC of 8.1, otherwise was within normal limits.  Point of care cardiac markers were  negative.  Urinalysis was positive  for urinary tract infection.  The patient given some morphine and Zofran  in the ED.  We were called to admit the patient for further evaluation  and management.   ALLERGIES:  CODEINE CAUSES NAUSEA.   PAST MEDICAL HISTORY:  1. Hypertension.  2. Diabetes.  3. Status post Nissen fundoplication per Dr. Daphine Deutscher August 2009.  4. History of weight loss.  5. Grief reaction.  6. History of esophageal stricture.  7. History of colonic polyps.  8. Diverticulosis.  9. Osteoporosis.  10.Hyperlipidemia.  11.Gastroesophageal reflux disease.  12.Chronic shortness of breath.  13.Status post right ankle surgery ORIF August 2009.  14.Status post hysterectomy.  15.Status post tonsillectomy.  16.Status appendectomy.  17.Status was appendectomy.  18.Status post [facility] sling for stress urinary incontinence per Dr.      Annabell Howells September 27, 2001.  19.Status post cystocele and rectocele repair.   HOME MEDICATIONS:  1. Simvastatin 80 mg p.o. daily  2. Metformin 500 mg p.o. b.i.d.  3. Benazepril 40 mg p.o. daily.  4. Amaryl 4 mg  p.o. daily.  5. Norvasc 10 mg p.o. daily.  6. Lantus 40 units subcutaneously daily.  7. Ibuprofen 200 mg as needed.  8. Ecotrin 81 mg p.o. daily which the patient rarely takes.  9. Omeprazole 20 mg p.o. daily.  10.Tylenol as needed.   SOCIAL HISTORY:  The patient is widowed.  No tobacco use.  No alcohol  use.  No IV drug use.  The patient currently lives on her own.  However,  is currently living with his son and daughter-in-law for the past 2  weeks.   FAMILY HISTORY:  Significant for coronary artery disease.   REVIEW OF SYSTEMS:  As per HPI, otherwise negative.   PHYSICAL EXAMINATION:  VITAL SIGNS:  Temperature 97.2, blood pressure  177/76, pulse of 87, respirations 18, satting 98% on room air.  GENERAL:  Patient is well-developed, well-nourished elderly white female  in no apparent distress.  HEENT:  Normocephalic, atraumatic.   Pupils equal round reactive light  and accommodation.  Extraocular movements intact.  Oropharynx is clear.  No lesions, no exudates.  NECK:  Supple.  No lymphadenopathy.  Dry mucous murmurs.  RESPIRATORY:  Lungs are clear to auscultation bilaterally.  No wheezes,  no crackles.  No rhonchi.  CARDIOVASCULAR:  Regular rate and rhythm.  No murmurs, rubs or gallops.  ABDOMEN:  Soft, nontender, nondistended.  Positive bowel sounds.  EXTREMITIES:  No clubbing, cyanosis or edema.  NEUROLOGICAL:  The patient is alert and oriented x3.  Cranial nerves II-  XII are grossly intact.  No focal deficits.   ADMISSION LABORATORY DATA:  C-met with a sodium of 135, potassium of  3.8, chloride 104, bicarb 26, BUN 12, creatinine 0.96, glucose of 140,  bilirubin 0.5, alk phosphatase 43, AST 25, ALT 50, calcium of 9.1,  albumin of 3.6, protein of 7.1.  Point of care cardiac markers CK-MB  1.3, troponin I less than 0.05, myoglobin of 118.  CBC with a white  count of 11.1, hemoglobin 10.4, platelets of 237, hematocrit 30.9, ANC  of 8.1.  Urinalysis was yellow, cloudy, specific gravity 1.004, pH of 6,  glucose negative, bilirubin negative, ketones negative blood negative,  protein negative, urobilinogen 0.2, nitrite negative, leukocytes  moderate.  Urine microscopy:  WBCs 21-50, bacteria was few.   Chest x-ray done showed no acute cardiopulmonary process, old fracture  of the proximal left humerus.  CT of the head without contrast was done  showed no acute intracranial of calvarium finding.  Maxillary sinus  mucosal thickening bilaterally.  CT of the left lower extremity showed  no evidence of acute left hip fracture, dislocation, no significant soft  tissue abnormality.  X-rays of the left elbow showed joint effusion in  the setting of trauma suggest possibility of an occult fracture of the  radial head.  No displaced fracture or dislocation is identified.  Immobilization and radiographic follow-up are  suggested.  X-rays of the  left hip May 01, 2009, showed negative for acute fracture or  dislocation.  Soft tissue swelling lateral to the left proximal femur.  If there is persistent pain or inability to bear weight, follow-up  imaging may be warranted as acute fracture can be radiographically  occult in the elderly.  EKG done showed a new left bundle branch block.   ASSESSMENT AND PLAN:  Ms. [name] is a 77 year old female  history of hypertension, hyperlipidemia, diabetes presenting to the ED  after a syncopal episode.  1. Syncope, questionable etiology, likely cardiac in nature versus  neurological versus orthostatic versus infectious etiology.  We      will check a TSH.  Check an MRI/MRA of the head.  Check a fasting      lipid panel.  Check a hemoglobin A1c.  Check a urine culture and      check a 2-D echo.  Check carotid Dopplers.  Cycle cardiac enzymes      and follow.  Cardiology has been consulted and will follow-up.      Continue IV Rocephin.  2. New left bundle branch block.  Cycle cardiac markers q.8 h x3.      Check a TSH.  Check a BNP.  Check a 2-D echo.  Check a urine,      sodium and creatinine.  Check a lipase.  We will check a lipase      level.  Cardiology is following.  Will place on full dose aspirin.  3. Anemia.  Check an anemia panel, guaiac stools and follow.  4. UTI.  Check a urine cultures.  Continue IV Rocephin.  5. Altered mental status likely secondary to UTI versus secondary to      syncope and problem #2.  6. Hyperlipidemia.  Simvastatin.  7. Type 2 diabetes.  Sliding scale insulin and Lantus.  8. Hypertension.  Hold blood pressure medications for now secondary to      dehydration.  9. Dehydration.  Hydrated with IV fluids.  10.Fall likely secondary to problem #1.  X-rays are negative.  Will      follow.  11.Prophylaxis.  Protonix for GI prophylaxis.  Lovenox for DVT      prophylaxis.   It has been a pleasure taking care of this  patient.      Ramiro Harvest, MD  Electronically Signed     DT/MEDQ  D:  05/02/2009  T:  05/02/2009  Job:  [id number]   cc:   Corwin Levins, MD  [address]  [place]  [place] [address]   Rollene Rotunda, MD, [facility]  [address]  [place]  [place] [address]   Rosalyn Gess. Norins, MD  [address]  [place]  [place] [address]

## 2011-01-20 NOTE — Assessment & Plan Note (Signed)
[facility]                            CARDIOLOGY OFFICE NOTE   NAME:COVINGTONKirstyn, [name]                    MRN:          [id number]  DATE:11/21/2007                            DOB:          November 20, 1930    PRIMARY:  Dr. Oliver Barre.   REASON FOR PRESENTATION:  Evaluate the patient with dyspnea and  longstanding diabetes.   HISTORY OF PRESENT ILLNESS:  The patient is a lovely 77 year old white  female without prior cardiac history.  She has a long history of  diabetes.  Over the last 2 to 3 months she has had progressive dyspnea.  This has been with any exertion.  She had a little bit of it at rest  too.  She is not describing PND or orthopnea.  Rather she feels like she  cannot get a deep breath sometimes at rest.  However, when she exerts  herself, she gets dyspneic just walking on level distance for short  while.  This has been slowly progressive over those 2 to 3 months.  She  does not describe any chest pressure, neck or arm discomfort.  She does  not have any palpitations, presyncope, or syncope.   PAST MEDICAL HISTORY:  Diabetes mellitus times many years,  hyperlipidemia, hypertension, gastroesophageal reflux disease,  retinitis, mild renal insufficiency.   PAST SURGICAL HISTORY:  Ankle surgery, hysterectomy, tonsillectomy,  appendectomy.   ALLERGIES/INTOLERANCES:  None.   MEDICATIONS:  1. Lantus.  2. Glimepiride 4 mg b.i.d.  3. Metformin 500 mg b.i.d.  4. Hydrochlorothiazide 25 mg daily.  5. Omeprazole 40 mg daily.  6. Aspirin 81 mg daily.  7. Benazepril 40 mg daily.  8. Amlodipine 10 mg daily.  9. Fosamax 70 mg daily.  10.Ibuprofen 200 mg b.i.d.  11.Tylenol.   SOCIAL HISTORY:  The patient is married.  Her husband is a long-term  patient of mine.  She had 2 children.  She is not does not smoke  cigarettes and does not drink alcohol.   FAMILY HISTORY:  Heart disease in first-degree relatives, though most of  it was a little bit  later onset.   REVIEW OF SYSTEMS:  As stated in the HPI and positive for some mild  difficulty hearing, tinnitus, occasional nausea, constipation, reflux,  hiatal hernia, nocturia, leg cramps, chronic ankle swelling.  Negative  for other systems.   PHYSICAL EXAMINATION:  The patient is pleasant and in no distress.  Blood pressure 155/69, heart rate 82 and regular, weight 177 pounds,  body mass 30.  HEENT:  Eyelids unremarkable.  Pupils are equal, round, and reactive to  light and accommodation.  Fundi are not visualized.  Oral mucosa  unremarkable.  NECK:  No jugular venous distension at 45 degrees, carotid upstroke  brisk and symmetric, no bruits, thyromegaly.  LYMPHATICS:  No cervical, axillary, or inguinal adenopathy.  LUNGS:  Clear to auscultation bilaterally.  BACK:  No costovertebral angle tenderness.  CHEST:  Unremarkable.  HEART:  PMI not displaced or sustained, S1 and S2 within normal limits,  no S3, no S4, no clicks, rubs, murmurs.  ABDOMEN:  Obese, positive bowel sounds,  normal in frequency and pitch,  no bruits, rebound, guarding.  No midline pulsatile mass, hepatomegaly,  splenomegaly.  SKIN:  No rashes, no nodules.  EXTREMITIES:  With 2+ pulses throughout, mild bilateral lower extremity  edema.  NEURO:  Oriented to person, place, and time, cranial nerves 2-12 grossly  intact, motor grossly intact.    EKG sinus rhythm, rate 82, axis within normal limits, intervals within  normal limits, possible anteroseptal infarct, no acute ST-T wave change.   ASSESSMENT/PLAN:  1. Dyspnea.  The patient's dyspnea is worrisome for anginal equivalent      in this patient with longstanding diabetes.  I am going to check a      BNP level.  I am going to check a stress perfusion study.  She      would not be able to walk on a treadmill, so this will be an      adenosine perfusion study.  I am going to get an echocardiogram to      rule out any evidence of LV systolic or diastolic  dysfunction or      valvular abnormalities.  If all of the above does not suggest      cardiac etiology then we will refer her for pulmonary evaluation.  2. Hypertension.  Blood pressure is elevated.  I have asked them to      keep a blood pressure diary.  They do report that, at home when her      husband checks it on his monitor, it is fine.  She may need up      titration of her drugs based on this.  3. Diabetes.  Under Dr. Raphael Gibney followup.  4. Dyslipidemia.  The patient would benefit statistically based on the      Heart Protection Study from a statin.  We will discuss this.  5. Obesity.  She understands the need to lose weight with diet and      exercise.  6. Followup.  Will see her back after the above testing.     Rollene Rotunda, MD, [facility]  Electronically Signed    JH/MedQ  DD: 11/21/2007  DT: 11/21/2007  Job #: [id number]   cc:   Corwin Levins, MD

## 2011-01-20 NOTE — Discharge Summary (Signed)
Julia [name], [name]             ACCOUNT NO.:  [id number]   MEDICAL RECORD NO.:  [id number]          PATIENT TYPE:  INP   LOCATION:  1416                         FACILITY:  [facility]   PHYSICIAN:  Rosalyn Gess. Norins, MD  DATE OF BIRTH:  1931/04/30   DATE OF ADMISSION:  05/01/2009  DATE OF DISCHARGE:  05/04/2009                               DISCHARGE SUMMARY   ADMISSION DIAGNOSES:  1. Syncope.  2. Left bundle branch block.  3. Anemia.  4. Urinary tract infection.  5. Altered mental status.  6. Hyperlipidemia.  7. Type 2 diabetes.  8. Hypertension.  9. Dehydration.   DISCHARGE DIAGNOSES:  1. Syncope.  2. Left bundle branch block.  3. Anemia.  4. Urinary tract infection.  5. Altered mental status.  6. Hyperlipidemia.  7. Type 2 diabetes.  8. Hypertension.  9. Dehydration.   CONSULTANTS:  Mickey Farber, M.D. and the on-call for [facility]  Cardiology.   PROCEDURES:  1. Left hip film complete performed day of admission, August 25, which      was negative for acute fracture dislocation.  Soft tissue swelling      lateral of the left proximal femur noted.  2. Left elbow day of admission which showed joint effusion in the      setting of trauma, suggesting possibly occult fracture radial head,      no displaced fracture dislocation is identified.  3. Chest x-ray date of admission which showed no acute cardiopulmonary      process.  Old fracture of the proximal left humerus noted.  4. A CT of the brain without contrast performed day of admission,      August 25, which showed no acute intracranial calvarial findings.      Maxillary sinus mucosal thickening bilaterally.  5. A CT scan of the lower extremity performed August 26 which showed      no evidence of acute left hip fracture dislocation or significant      soft tissue abnormality.   Julia [name] is a 78 year old woman with history of hypertension,  diabetes, hyperlipidemia and history of Nissen fundoplication presented  to the emergency department after having had a fall, probable syncopal  episode.  She reports she was outside on the deck and does not remember  the incident.  She was fall on her left side, was complaining of pain  and discomfort.  When the patient regained consciousness, she called her  son and his wife responded to the call.  On arrival at the patient's  home, daughter-in-law found her to be disoriented and confused,  complaining of left-sided pain and groin pain.  In the ER the patient  denied any fevers, cough, nausea, vomiting, abdominal pain, dysuria,  diarrhea, focal neurologic symptoms.  She did report she had some  generalized weakness, chills, constipation and nausea.  In the emergency  department.  X-rays and CT were negative for fractures or stroke.  EKG  revealed a new left bundle branch block.  The patient had a minimally  elevated white count 11,100.  Urinalysis was positive for urinary tract  infection.  The patient was  admitted for management of these problems  and evaluation of left bundle branch block.   Please see H&P for past medical history, family history, social history  and admission examination.   HOSPITAL COURSE:  1. Syncope.  Patient with a negative evaluation.  She was on telemetry      which revealed no significant abnormalities.  Laboratory      evaluations were unremarkable.  The patient had no recurrent      episodes of syncope or lightheadedness.  2. Cardiovascular:  Patient with a left bundle branch block with no      evidence of acute coronary syndrome.  She was seen by cardiology      consultation who did not feel she required any additional      intervention or workup.  She remained stable on telemetry.  3. Urinary tract infection.  The patient was treated with Rocephin IV      for 2 days then switched to Ceftin.  On this regimen she remained      afebrile.  4. Hyperlipidemia.  The patient had a laboratory drawn in hospital      which showed good  control.  5. Diabetes.  The patient's A1c was checked in hospital returning at a      at the 7% indicating adequate control.  6. Hypertension.  The patient's blood pressure remained mildly      elevated but stable during her hospital stay.  She will continue      her home medications.  7. Dehydration.  The patient did well with IV hydration with      normalization of her lab work.  8. Orthopedics.  The patient had no obvious fracture.  Question was      raised of occult fracture of the radial head and arm but no further      studies were done.  The patient was seen and evaluated by PT who as      of their notes of August 27 at 1553 hours recommended that the      patient would be safe to discharge to the patient's son's home with      home health physical therapy,  rolling walker, a 3-in-1 bedside      commode.  Will make arrangements for the same.   The patient having ruled out any underlying cardiac disease with no  recurrent episodes of syncope with no need for any orthopedic  intervention, at this point she is felt to be stable and ready for  discharge to her son's home.   DISCHARGE PHYSICAL EXAMINATION:  VITAL SIGNS:  Temperature of 98.2,  blood pressure 171/62, heart rate was 75, respirations were 18, oxygen  saturation 95% on room air.  GENERAL APPEARANCE:  A pleasant, elderly woman in no acute distress.  HEENT:  The patient has a bruising to the left side of her face at the  level of the orbit.  She had no Battle sign.  NECK:  Supple.  CHEST:  Patient is moving air well.  No rales or wheezes.  No increased  work of breathing.  CARDIOVASCULAR:  The patient had a quiet precordium.  She had a regular  rate and rhythm and no murmurs were appreciated.  ABDOMEN:  Abdomen was soft.  EXTREMITIES:  The patient has good movement of her left arm.  She was  not stood or walked since she has just been seen by physical therapy.   LABORATORY DATA:  Final laboratory metabolic panel day  of  discharge with  sodium 139, potassium 3.4, chloride 107, CO2 28, glucose 188, BUN of 8,  creatinine 0.8.  Folate from August 26 was normal at 549.  B12 from  August 27 was normal at 348.  Cardiac markers in the ER were normal with  a troponin of less than 0.05.  Next CK was 155 with troponin 0.02.  Next  was a CK of 120 with troponin 0.02.  Last on August 26 with a creatinine  of 104 with troponin of 0.03.  Anemia panel was performed which showed a  reticulocyte count of 23.3, iron was 31 which was slightly low.  TIBC  was 258 which was normal.  Percent iron saturation was 12.  B12 was 426.  A1c was 7%.  RPR was negative.  TSH was normal at 1.26.  Lipid profile  showed cholesterol of 163, triglycerides 63, HDL was 54, LDL was 96.  BNP was normal at 61.  Magnesium was normal of 1.8.  Lipase was normal  at 10.   DISCHARGE MEDICATIONS:  The patient will resume her home medications  including:  1. Zocor 80 mg daily.  2. Metformin 500 mg b.i.d.  3. Benazepril 40 mg daily.  4. Amaryl 4 mg daily.  5. Norvasc 10 mg daily.  6. Lantus 40 units at bedtime.  7. Ibuprofen 200 mg p.o. q. four p.r.n.  8. Ecotrin 81 mg daily.  9. Omeprazole 20 mg q.a.m.  10.Tylenol 500 mg one or two t.i.d. as needed.  11.Will add iron 325 mg b.i.d.  12.Will continue cefuroxime 250 mg b.i.d. for an additional 5 days.   DISPOSITION:  The patient will be discharged to home.  Will have home  health R.N. check the patient twice a week for 2 weeks.  We will have PT  see the patient at home for evaluation and treatment as needed.  She  will have home health provide rolling walker with 4-inch wheel to be  delivered on Sunday.   FOLLOW UP:  The patient is instructed to call for a followup appoint  with Dr. Efrain Sella in 1-2 weeks.   CONDITION:  The patient's condition at time of discharge dictation is  stable and improved.      Rosalyn Gess Norins, MD  Electronically Signed     MEN/MEDQ  D:  05/04/2009  T:   05/04/2009  Job:  [id number]   cc:   Corwin Levins, MD  [address]  [place]  [place] [address]

## 2011-01-20 NOTE — Consult Note (Signed)
NAMECARRISSA, TAITANO NO.:  [id number]   MEDICAL RECORD NO.:  [id number]          PATIENT TYPE:  INP   LOCATION:  1416                         FACILITY:  [facility]   PHYSICIAN:  Mickey Farber, MD      DATE OF BIRTH:  21-Sep-1930   DATE OF CONSULTATION:  05/02/2009  DATE OF DISCHARGE:                                 CONSULTATION   TIME SEEN:  Time of this is consultations 1:00 A.M.   PRIMARY CARE PHYSICIAN:  Corwin Levins, M.D.   CHIEF COMPLAINT:  I fell.   REASON FOR CONSULTATION:  The patient is a 77 year old Caucasian female  who presents to [facility] as a cardiology consult for further  recommendations regarding new left bundle branch pattern and possible  cardiac catheterization.   HISTORY OF PRESENT ILLNESS:  The patient is a fairly poor historian and  the history provided by her daughter-in-law, Ms. Deya Bigos, (home  phone number [phone] and cell phone [phone]).  According to the  daughter-in-law, the patient lives with her son and daughter-in-law at  this time.  She has recently lost her husband within the past year or  so.  Today, the patient was outside on the deck when she had an  unwitnessed fall.  Apparently she has no recollection of the events  either before, during or after.  Thus, we cannot say whether she had any  chest pain, palpitations or presyncope.  The patient then was able to  get up; and, walked over to the couch.  She called her son at work.  Family then called EMS.  After calling EMS the patient felt that her  blood sugar was low.  She ate some candy in an attempt to raise her  blood sugars.  On EMS's report, her blood sugar was approximately 150.  She was somewhat confused and disoriented to her whereabouts.  She is  brought to [facility].  A trauma workup was initiated by the ED.  Given  her confusion an infectious workup was also pending.  An EKG that was  obtain was obtained while she was at [facility]  showed a new left  bundle branch block with no acute ST-T changes indicative of an acute  MI.  The internal medicine hospitalist has requested a consult  with a  specific question as to whether this patient should be taken to the  cardiac catheterization lab for further evaluation of her new left  bundle branch block.  At this time the patient is alert to person, place  and time; however, she is confabulating the parts of her story in the  presence of her family.  Also, it is important to note that the family  reports a decrease in by mouth intake as well as significant weight loss  for the last 2 or 3 weeks, but no change in her insulin regimen.   ALLERGIES:  CODEINE, OXYCODONE/APAP AND HYDROCODONE/APAP.   MEDICATIONS:  1. Simvastatin 80 mg daily.  2. Metformin 5 mg twice a day.  3. Benazepril 40 mg daily.  4. Amlodipine 10 mg daily.  5. Glimepiride 4 mg twice a day.  6. Omeprazole 20 mg daily.  7. She is also on insulin.  Notable in her medication bag are multiple bottles with multiple  medications and am uncertain as to what these medications truly are.   PAST MEDICAL AND SURGICAL HISTORY:  1. Reported history of normal stress perfusion scan is 2009; at that      time she saw Dr. Rollene Rotunda.  2. Status post Nissen fundoplication and repair of hiatal hernia on      April 20, 2008.  3. Hypertension.  4. Stress urinary incontinence.  5. History of previous fall.  6. Status post ORIF of the right ankle on July 20, 2003.  7. Insulin-dependent diabetic.   SOCIAL HISTORY:  The patient lives with her daughter-in-law and son.  She is retired and denies any tobacco or alcohol use.   FAMILY HISTORY:  The family history is noncontributory.   REVIEW OF SYSTEMS:  The patient's family does report chills over the  last 2-3 days and significant weight loss of approximately 20-25 pounds  in the last 2-3 weeks.  There has been unintentional weight loss.  Otherwise other systems  are reviewed and are negative.   PHYSICAL EXAMINATION:  VITAL SIGNS:  Upon triage in the ED blood  pressure was 177/76, heart rate 87, respiratory rate 80 and sat 98% on  room air.  GENERAL:  The patient is in no acute distress.  HEENT:  Normocephalic  and atraumatic.  PERRL.  EOMI.  Her conjunctivae are pale.  NECK:  Supple.  There is a bruit along the right carotid.  No JVD is  appreciated.  HEART:  Cardiovascular; heart rate is normal.  Rhythm is  regular.  S1 and S2 noted.  Again, she does have a soft systolic bruit  heard in throughout the precordium.  LUNGS:  Clear to auscultation bilaterally with poor respiratory effort.  SKIN:  She has some bruises around her left eye.  ABDOMEN:  Soft and nontender.  Bowel sounds were noted.  GENITALIA AND  RECTAL:  GU and rectal are deferred.  EXTREMITIES:  The extremities show  no clubbing, cyanosis or edema.  MUSCULOSKELETAL:  Her left shoulder is  in a sling.  She has focal right left hip pain on palpation.  NEUROLOGIC:  Alert to person, place and time.  Cranial nerves II-XII are  grossly intact.  Strength cannot be fully assessed due to patient  participation.   LABORATORY DATA:  Radiology:  Left hip shows soft tissue swelling of the  lateral portion of the left femoral greater trochanter.  Some osteopenia  is noted.  EKG; heart rate 88, sinus rhythm is noted.  Axis showed left  axis deviation.  PR interval was 202, QRS was 132 and QTC was 503, and  the left bundle branch is new; but, there is no criteria for acute  myocardial infarction noted.   Labs:  White count 1.4, platelets 237,000 and hemoglobin 10.4.  ANC is  elevated at 8.1.  Creatinine 0.96 and potassium 2.8.  CK/MB and troponin  are currently pending, but the point of care MB and troponin are 1.3 and  less than 0.05 respectively.  UA shows moderate leukocyte esterase,  nitrites are negative and bacteria are few.  MCV is 89.2.   ASSESSMENT:  This is a 77 year old female with  no previous coronary  artery disease and reportedly normal stress perfusion study in March  2009 presents to [facility] for consultation regarding new  left bundle  branch block in the setting of increased blood pressure on presentation  (177/76), confusion, unwitnessed fall, urinary tract infection, and  decreased by mouth intake with no change in insulin at [facility] ED.  We are consulted for further recommendations regarding the new left  bundle branch block.   IMPRESSION:  1. New left bundle branch block (QRS 132 milliseconds) and increased      QTC likely secondary to advanced age and increased blood pressure      on presentation in the setting of known hypertension.  Although the      patient is confused she can answer questions appropriately and is      alert to person, place and time.  She specifically denies any chest      pain and her cardiac enzymes were negative times one.  There are no      electrocardiographic changes noted for acute myocardial infarction      in the setting of this left bundle branch block that is new.  I do      not feel as though urgent cardiac catheterization is warranted      given her presenting symptoms and pending trauma/infection      evaluation.  2. Syncopal episode.  Uncertain as to whether she had true syncope.      Must consider electrolyte abnormalities, urinary tract infection,      hypoglycemia, hypothyroid as well as cardiovascular concerns.  3. First-degree A-V block, old; seen on prior electrocardiogram      obtained March 22, 2009.   PLAN/RECOMMENDATIONS:  1. Admit to IM to telemetry bed. Obtain 2-D echocardiogram in the      morning. Once acute issues are resolved, then may consider further      evaluation with outpatient stress imaging. She is reluctant for      cardiac cath at this point in time.  2. Consider a carotid ultrasound (see physical exam as above). Agree      with infectious workup as well as trauma workup.  Check folate,  B12      and TSH given confusion, cardiac BNP. Follow sugars and adjust      insulin as needed.  Given weight-loss must consider hemoccult as      well.      Mickey Farber, MD  Electronically Signed     SS/MEDQ  D:  05/02/2009  T:  05/02/2009  Job:  [id number]

## 2011-01-20 NOTE — Discharge Summary (Signed)
[name], [name]             ACCOUNT NO.:  [id number]   MEDICAL RECORD NO.:  [id number]          PATIENT TYPE:  INP   LOCATION:  1540                         FACILITY:  [facility]   PHYSICIAN:  Thornton Park. Daphine Deutscher, MD  DATE OF BIRTH:  [birth year]   DATE OF ADMISSION:  04/20/2008  DATE OF DISCHARGE:  04/24/2008                               DISCHARGE SUMMARY   ADMITTING DIAGNOSIS:  Large type 3 mixed hiatal hernia.   PROCEDURE:  On April 20, 2008, was a laparoscopic takedown of  incarcerated stomach within the chest, repair of the hiatus, Nissen  fundoplication.   HOSPITAL COURSE:  [name] had the above-mentioned operation and  was observed.  Her diabetes was managed and observed with a sliding  scale.  Upper GI was done on April 21, 2008, showing no leak or  extravasation and fluid moved through the anastomosis.  She had been  kept down in the intensive care unit and was transferred upstairs where  she remained until postop day #4 when she was ready go home.  At that  time she was not taking anything for pain.  Her incisions were healing  nicely.  She was taking a full liquid diet which she will stay on for 4  weeks.  She said she did not need anything for pain so she will take  Tylenol for pain.  She will return to see me in the office in 4 weeks.   CONDITION:  Good.   FINAL DIAGNOSIS:  Status post repair of complex hiatal hernia  laparoscopically.      Thornton Park Daphine Deutscher, MD  Electronically Signed     MBM/MEDQ  D:  04/24/2008  T:  04/24/2008  Job:  [id number]   cc:   Vania Rea. Jarold Motto, MD, FACG, FACP, FAGA  [address]  [place]  [place] [address]   Leslye Peer, MD  [address]  [place], [place] [address]   Corwin Levins, MD  [address]  [place]  [place] [address]

## 2011-01-23 NOTE — Op Note (Signed)
[facility]  Patient:    [name], [name] Visit Number: [id number] MRN: [id number]          Service Type: DSU Location: DAY Attending Physician:  Evlyn Clines Proc. Date: 06/02/01 Admit Date:  06/02/2001   CC:         Corwin Levins, M.D. [facility]  Thornton Park. Daphine Deutscher, M.D.   Operative Report  PROCEDURE:  Cystoscopy, right retrograde pyelogram with interpretation.  PREOPERATIVE DIAGNOSES:  Right flank pain and hydronephrosis.  POSTOPERATIVE DIAGNOSIS:  Right flank pain.  SURGEON:  Excell Seltzer. Annabell Howells, M.D.  ANESTHESIA:  General.  COMPLICATIONS:  None.  INDICATIONS:  Ms. Dripps is an old patient of mine with a history of sling and collagen implant, who had had a several week history of intermittent severe right upper quadrant pain with nausea.  She had been treated for UTI in July.  A CT scan ordered by Dr. Wenda Low for evaluation of gallbladder and liver demonstrated mild right hydronephrosis with hydroureter down to the sacrum.  There was some questionable thickening of the ureteral wall.  It was felt that cystoscopy and retrograde was indicated.  FINDINGS AT PROCEDURE:  The patient was taken to the operating room where a general anesthetic was induced.  She was placed in the lithotomy position. Her perineum and genitalia were prepped with Betadine solution.  She was draped in the usual sterile fashion.  She received PO Tequin preoperatively. Cystoscopy was performed using a 22 [place] scope and the 12 and 70 degree lens.  Examination revealed a normal urethra.  There was minimal evidence of the prior collagen injection.  The bladder wall was smooth and felt without tumor, stones, or inflammation.  The ureteral orifices were in their normal anatomic position effluxing clear urine.  The right ureteral orifice was cannulated with 5 [place] open end catheter.  A contrast was instilled in the retrograde fashion.  The contrast flowed freely up  the ureter to the internal collecting system.  No obstruction, intraureteral or intrarenal filling defects were noted.  No stricturing was seen.  After completion of retrograde pyelogram, the kidney was observed and appeared to drain well.  There was some fullness of an upper calix, but it appeared to be more of an anatomic variation than any sort of abnormality.  After completion of the retrograde pyelogram, her bladder was drained.  She was taken down from lithotomy position.  Her anesthetic was reversed.  She was moved to the recovery room in stable condition, and there were no complications. Attending Physician:  Evlyn Clines DD:  06/02/01 TD:  06/02/01 Job: [id number]

## 2011-01-23 NOTE — H&P (Signed)
NAME:  [name], [name] NO.:  [id number]   MEDICAL RECORD NO.:  [id number]                   PATIENT TYPE:  INP   LOCATION:  0478                                 FACILITY:  [facility]   PHYSICIAN:  Leonides Grills, M.D.                  DATE OF BIRTH:  22-Oct-1930   DATE OF ADMISSION:  07/19/2003  DATE OF DISCHARGE:                                HISTORY & PHYSICAL   CHIEF COMPLAINT:  Right ankle pain.   HISTORY OF PRESENT ILLNESS:  The patient is a 77 year old female who has  previously undergone ankle surgery; however, she was in her yard recently  and had a fall.  She subsequently presented to see Dr. Charlann Boxer in clinic where  it was noted that she had a medial and posterior malleolus fracture of the  right ankle.  Dr. Lestine Box was consulted and she was to be admitted to [facility] for open reduction internal fixation of the right ankle on  Friday, July 20, 2003, by Dr. Leonides Grills.  The risks and benefits of  the surgery have been discussed with the patient and the patient wishes to  proceed.   PAST MEDICAL HISTORY:  1. Diabetes mellitus.  2. Hypertension.   PAST SURGICAL HISTORY:  1. Appendectomy.  2. Tonsils and adenoids.  3. Hysterectomy.  4. Right ankle surgery.   MEDICATIONS:  1. Lantus 34 units in the a.m.  2. Metformin 500 mg three p.o. q.h.s.  3. Nexium 20 mg one p.o. b.i.d.  4. Vasotec, dosage not known, patient is to bring, two p.o. q.a.m.  5. Norvasc, dosage not known, the patient is also to bring, one p.o. q.d.   ALLERGIES:  1. CODEINE.  2. PERCOCET.   SOCIAL HISTORY:  The patient denies any tobacco or alcohol use.  She is  married.   FAMILY HISTORY:  Mother with diabetes mellitus, coronary artery disease, and  myocardial infarction.  Father with diabetes mellitus and colon cancer.   REVIEW OF SYSTEMS:  GENERAL:  Denies fevers, chills, night sweats, bleeding  tendencies.  CNS:  Positive blurry vision.  Denies double  vision, seizures,  headaches, paralysis.  RESPIRATORY:  Denies shortness of breath, productive  cough, or hemoptysis.  CARDIOVASCULAR:  Positive chest pain, denies angina  or orthopnea.  GASTROINTESTINAL:  Positive constipation.  Denies nausea,  vomiting, diarrhea, melena, or bloody stools.  GENITOURINARY:  Denies  dysuria, hematuria, discharge.  MUSCULOSKELETAL:  Positive for numbness and  tingling of the bilateral upper extremities.   PHYSICAL EXAMINATION:  VITAL SIGNS:  Blood pressure 170/70, pulse 70,  respirations 16.  GENERAL:  A well-developed, well-nourished, 77 year old female.  HEENT:  Normocephalic, atraumatic.  Pupils equal, round, reactive to light.  NECK:  Supple, no carotid bruit.  CHEST:  Clear to auscultation bilaterally.  No wheezes or crackles.  HEART:  Regular rate and rhythm, no murmurs, rubs, or gallops.  ABDOMEN:  Soft, nontender, nondistended, positive bowel sounds x4.  EXTREMITIES:  Positive edema to the right ankle, obvious deformity.  Good  pulses and she is neurovascularly intact distally.  SKIN:  No rashes or lesions.   LABORATORY DATA:  X-ray reveals medial and posterior malleolus fracture of  the right ankle.   IMPRESSION:  1. Right ankle fracture.  2. Diabetes mellitus.  3. Hypertension.   PLAN:  The patient is being admitted to [facility] under Dr. Leonides Grills for open reduction internal fixation of the right ankle on July 20, 2003.     Clarene Reamer, P.A.-C.                   Leonides Grills, M.D.    SW/MEDQ  D:  07/20/2003  T:  07/20/2003  Job:  [id number]   cc:   Corwin Levins, M.D. [facility]

## 2011-01-23 NOTE — Discharge Summary (Signed)
NAME:  [name], [name] NO.:  [id number]   MEDICAL RECORD NO.:  [id number]                   PATIENT TYPE:  INP   LOCATION:  0478                                 FACILITY:  [facility]   PHYSICIAN:  Leonides Grills, M.D.                  DATE OF BIRTH:  04-Sep-1931   DATE OF ADMISSION:  07/19/2003  DATE OF DISCHARGE:  07/21/2003                                 DISCHARGE SUMMARY   ADMITTING DIAGNOSES:  1. Right ankle fracture.  2. Diabetes mellitus.  3. Hypertension.   BRIEF HISTORY OF PRESENT ILLNESS:  This 77 year old female who had  previously undergone ankle surgery.  However, she was in her yard recently  and had fallen.  She came in to see Dr. Charlann Boxer in clinic where she was  diagnosed with medial posterior malleolus fracture of the right ankle.  Dr.  Lestine Box was consulted, sent her to [facility].  Planned for open  reduction internal fixation right ankle on July 20, 2003.  We did get  medical consult clearing her for surgery.   PAST MEDICAL HISTORY:  1. Diabetes.  2. Hypertension.   PAST SURGICAL HISTORY:  1. Appendectomy.  2. Tonsillectomy.  3. Adenoids.  4. Hysterectomy.  5. Right ankle surgery.   MEDICATIONS:  1. Lantus 34 units in the a.m.  2. Metformin 500 at bedtime.  3. Nexium 20 mg p.o. b.i.d.  4. Vasotec two in a.m.  5. Norvasc one p.o. daily.   ALLERGIES:  1. PERCOCET.  2. CODEINE.   BRIEF HISTORY OF STAY:  The patient was medically cleared by medical doctor  on July 20, 2003.  The patient was taken to the OR by Dr. Leonides Grills  for open reduction internal fixation of right pilon fracture and right  bimalleolar fracture, conservative lateral malleolus fracture.  The patient  tolerated surgery well.  Postoperatively the patient was neurovascularly  motor intact.  Vital signs were stable, labs were stable.  The patient was  alert, awake, comfortable.  Active range of motion of the toes was intact,  capillary refill  was brisk, splint was in place, and she was ready for  discharge.  She was discharged home to resume home medications.  Home health  physical therapy was ordered, nonweightbearing right lower extremity,  weightbearing left lower extremity with Cam walking boot secondary to  lateral malleolus fracture on the left.  The patient was stable.   FINAL DIAGNOSES:  1. Right pilon fracture.  2. Right bimalleolar fracture.  3. Left lateral malleolus fracture treated conservatively in Cam walking     boot.   We want her to follow up with Dr. Lestine Box in approximately two weeks from  day of surgery.  We gave her Mepergan fortis, started on her aspirin 325  b.i.d. and Levaquin 500 daily for a month.  The patient was stable.  Diet:  General as tolerates.  Lianne Cure, P.A.                     Leonides Grills, M.D.    MC/MEDQ  D:  09/03/2003  T:  09/03/2003  Job:  [id number]

## 2011-01-23 NOTE — Op Note (Signed)
NAME:  [name], [name] NO.:  [id number]   MEDICAL RECORD NO.:  [id number]                   PATIENT TYPE:  INP   LOCATION:  0478                                 FACILITY:  [facility]   PHYSICIAN:  Leonides Grills, M.D.                  DATE OF BIRTH:  14-Feb-1931   DATE OF PROCEDURE:  07/20/2003  DATE OF DISCHARGE:                                 OPERATIVE REPORT   PREOPERATIVE DIAGNOSES:  1. Right pilon fracture.  2. Right bimalleolar ankle fracture.  3. Left lateral malleolus fracture.   POSTOPERATIVE DIAGNOSES:  1. Right pilon fracture.  2. Right bimalleolar ankle fracture.  3. Left lateral malleolus fracture.   PROCEDURE:  1. Open reduction and internal fixation, right pilon fracture.  2. Stress x-ray of the right ankle.  3. Conservative management, right bimalleolar ankle fracture.  4. Conservative management, left lateral malleolus fracture.   ANESTHESIA:  General endotracheal.   SURGEON:  Sherri Rad, M.D.   ASSISTANT:  Lianne Cure, P.A.-C.   ESTIMATED BLOOD LOSS:  Minimal.   TOURNIQUET TIME:  Approximately one hour.   COMPLICATIONS:  None.   DISPOSITION:  Stable to the PR.   INDICATIONS:  This is a 77 year old female who has had diabetes mellitus and  a previous open reduction/internal fixation of a pilon fracture.  She has  had diabetes mellitus for the past 35 years with neuropathy.  She has  consented for the above procedure.  All risks which include infection,  neurovascular injury, nonunion, malunion, Charcot arthropathy, possible  Morse fixation surgery, arthritis with stiffness were all explained and  questions were encouraged and answered.   DESCRIPTION OF PROCEDURE:  The patient was brought to the operating room and  placed in the supine position.  After adequate general endotracheal  anesthesia was administered as well as Ancef 1 g IV piggyback, the patient  was then placed in the lateral decubitus position with  the operative side  up.  Right lower extremity was then prepped and draped in the sterile manner  over a proximal thigh tourniquet.  The wound was then gravity exsanguinated  and tourniquet was elevated at 290 mmHg.  Posterolateral approach was then  performed to the ankle. Dissection was carried down to posterior aspect of  the ankle.  The ankle was posteriorly dislocated with a large posterior  fragment of the tibial plafond.   After soft tissue was elevated off the posterior aspect of the tibia, a four-  hole, large fragment T-plate was then applied.  Screw was then placed just  proximal to the fracture site, like an antilag screw, after a 3.2 drill was  placed and 4.5 fully-threaded cortical screw was then placed and the more  proximal screws were then filled with 4.5 fully-threaded cortical screws.  Two distal 4.5 fully-threaded cortical screws were then placed.  Final x-  rays were obtained in the AP, lateral and  mortise views that showed  anatomical reduction.  The ankle was stressed as well and there was no  instability to varus/valgus stress, the reason why the medial and lateral  malleoli were not fixed at this point and were treated conservatively.   On CT scan of the lateral malleolus, the left ankle was fractured and it was  more of a Weber-A and we treated this conservatively and she can walk with  it weightbearing as tolerated.   Once the wound was copiously irrigated with normal saline, the tourniquet  was deflated, hemostasis was obtained.  Subcutaneous tissue was closed with  3-0 Vicryl.  Skin was closed with 4-0 nylon.  Sterile dressing was applied.  Modified Jones dressing was applied.   The patient was stable to the PR.                                               Leonides Grills, M.D.    PB/MEDQ  D:  07/20/2003  T:  07/20/2003  Job:  [id number]

## 2011-01-23 NOTE — Op Note (Signed)
[facility]  Patient:    HIRA, TRENT Visit Number: [id number] MRN: [id number]          Service Type: NES Location: NESC Attending Physician:  Evlyn Clines Dictated by:   Excell Seltzer. Annabell Howells, M.D. Proc. Date: 09/27/01 Admit Date:  09/27/2001   CC:         Corwin Levins, M.D. [facility]                           Operative Report  PROCEDURE:  SPARC sling.  PREOPERATIVE DIAGNOSIS:  Stress incontinence.  POSTOPERATIVE DIAGNOSIS:  Stress incontinence.  SURGEON:  Excell Seltzer. Annabell Howells, M.D.  ANESTHESIA:  General.  DRAINS:  Foley catheter and vaginal pack.  COMPLICATIONS: None.  INDICATIONS:  Ms. Scarboro is a 77 year old white female with a history of stress urinary incontinence.  She has previously been treated with a fascial sling.  She had recurrent incontinence with that.  She had collagen injections which were not successful.  She is now to undergo a SPARC sling in an attempt to correct her incontinence.  FINDINGS AND PROCEDURE:  The patient was given p.o. Tequin.  She was taken to the operating room where a general anesthetic was induced.  She was fitted with thigh-high TED and PAS hose.  She was then placed in a lithotomy position.  Her mons were shaved.  She was prepped with Betadine solution and draped in the usual sterile fashion.  A Foley catheter was inserted, and the bladder was drained.  The anterior vaginal wall over the midurethral level was infiltrated with 5 cc of 1% lidocaine with epinephrine, and a midline incision was made over the mid and proximal urethra approximately 2 cm in length. ______ was elevated off the pubourethral fascia for approximately 1 cm laterally on each side.  Once this dissection had been performed, two stab wounds were made a fingerbreadth above and 2 fingerbreadths from the midline on each side above the pubis.  Once these stab wounds were made, the [facility] trocar was passed, first on the right side.  The tip  of the trocar was taken down and was brought down through the musculofascial layer to the top of the pubic bone.  It was then walked along the back of the pubic bone until it could be palpated vaginally.  It was then brought into the right side of the vaginal incision with care being taken to push the urethra to the opposite side with a finger.  Once the tip was in the vaginal area, the left trocar was then passed in identical fashion.  Once both trocars were in position, cystoscopy was performed using a 22-French sheath and a 70-degree lens. Examination revealed no evidence of bladder wall injury or passage of the trocars through the bladder/urethral area.  At this point, the cystoscope was removed, and the bladder was left partially full.  The SPARC sling within its sheath was then connected to the trocars and drawn back to the abdominal incisions.  Once it was appropriately seated, the abdominal ends were cut, and the left sheath was removed.  The right sheath was then removed, maintaining appropriate tension and spacing in the midurethral level.  At this point, a Foley catheter was reinserted, and a hemostat was placed easily beneath the sling.  It appeared to be appropriately positioned at the midurethral level with an appropriate degree of tension.  At this point, pressure on the bladder produced some leakage  still, which was appropriate in this situation.  The vaginal incision was then closed with running locked 2-0 Vicryl stitch.  The redundant sling was then trimmed abdominally so that it would drop back beneath the skin level.  A Foley catheter was placed.  The bladder was drained.  Steri-Strips were applied to the abdominal incisions after cleaning the wounds and prepping with Benzoin.  A 2-inch Iodoform vaginal pack was then placed.  The patient was taken out from the lithotomy position.  Her anesthetic was reversed.  She was moved to the recovery room in a stable condition.   There were no complications. Dictated by:   Excell Seltzer. Annabell Howells, M.D. Attending Physician:  Evlyn Clines DD:  09/27/01 TD:  09/28/01 Job: [id number]

## 2011-01-26 ENCOUNTER — Ambulatory Visit (HOSPITAL_COMMUNITY): Payer: Medicare Other | Attending: Internal Medicine | Admitting: Radiology

## 2011-01-26 DIAGNOSIS — I359 Nonrheumatic aortic valve disorder, unspecified: Secondary | ICD-10-CM | POA: Insufficient documentation

## 2011-01-26 DIAGNOSIS — R0609 Other forms of dyspnea: Secondary | ICD-10-CM | POA: Insufficient documentation

## 2011-01-26 DIAGNOSIS — R5381 Other malaise: Secondary | ICD-10-CM | POA: Insufficient documentation

## 2011-01-26 DIAGNOSIS — R0989 Other specified symptoms and signs involving the circulatory and respiratory systems: Secondary | ICD-10-CM | POA: Insufficient documentation

## 2011-01-26 DIAGNOSIS — D649 Anemia, unspecified: Secondary | ICD-10-CM | POA: Insufficient documentation

## 2011-01-26 DIAGNOSIS — E119 Type 2 diabetes mellitus without complications: Secondary | ICD-10-CM | POA: Insufficient documentation

## 2011-01-26 DIAGNOSIS — M810 Age-related osteoporosis without current pathological fracture: Secondary | ICD-10-CM | POA: Insufficient documentation

## 2011-01-26 DIAGNOSIS — I10 Essential (primary) hypertension: Secondary | ICD-10-CM | POA: Insufficient documentation

## 2011-01-27 ENCOUNTER — Ambulatory Visit (INDEPENDENT_AMBULATORY_CARE_PROVIDER_SITE_OTHER)
Admission: RE | Admit: 2011-01-27 | Discharge: 2011-01-27 | Disposition: A | Discharge status: Home / Self Care | Payer: Medicare Other | Source: Ambulatory Visit | Attending: Internal Medicine | Admitting: Internal Medicine

## 2011-01-27 ENCOUNTER — Encounter: Payer: Self-pay | Admitting: Internal Medicine

## 2011-01-27 ENCOUNTER — Ambulatory Visit (INDEPENDENT_AMBULATORY_CARE_PROVIDER_SITE_OTHER): Payer: Medicare Other | Admitting: Internal Medicine

## 2011-01-27 VITALS — BP 150/70 | HR 80 | Temp 97.9°F | Ht 63.0 in | Wt 148.8 lb

## 2011-01-27 DIAGNOSIS — E785 Hyperlipidemia, unspecified: Secondary | ICD-10-CM

## 2011-01-27 DIAGNOSIS — I351 Nonrheumatic aortic (valve) insufficiency: Secondary | ICD-10-CM | POA: Insufficient documentation

## 2011-01-27 DIAGNOSIS — R0609 Other forms of dyspnea: Secondary | ICD-10-CM

## 2011-01-27 DIAGNOSIS — E119 Type 2 diabetes mellitus without complications: Secondary | ICD-10-CM

## 2011-01-27 DIAGNOSIS — R0989 Other specified symptoms and signs involving the circulatory and respiratory systems: Secondary | ICD-10-CM

## 2011-01-27 DIAGNOSIS — I359 Nonrheumatic aortic valve disorder, unspecified: Secondary | ICD-10-CM

## 2011-01-27 DIAGNOSIS — F0390 Unspecified dementia without behavioral disturbance: Secondary | ICD-10-CM | POA: Insufficient documentation

## 2011-01-27 MED ORDER — METFORMIN HCL 500 MG PO TABS: 500.0000 mg | ORAL_TABLET | Freq: Two times a day (BID) | ORAL | Status: DC

## 2011-01-27 MED ORDER — ROSUVASTATIN CALCIUM 20 MG PO TABS: 20.0000 mg | ORAL_TABLET | Freq: Every day | ORAL | Status: DC

## 2011-01-27 NOTE — Patient Instructions (Signed)
Please go to XRAY in the Basement for the x-ray test You will be contacted regarding the referral for: lung testing (PFT's) - to see [facility] before leaving today You will be contacted regarding the referral for: Cardiology - Dr Antoine Poche Increase the metformin 500 mg to twice per day Stop the simvastatin (zocor) Start the Crestor 20 mg per day

## 2011-01-27 NOTE — Assessment & Plan Note (Addendum)
Uncontrolled mild, to increase the metformin to bid (has been actually taking only once daily), to cont diet and wt control,   to f/u any worsening symptoms or concerns

## 2011-01-27 NOTE — Progress Notes (Signed)
Subjective:    Patient ID: [name], female    DOB: 06/10/1931, 77 y.o.   MRN: [id number]  HPI  Here to f/u, here with son today (with daughter in law last visit), had some misunderstanding at last visit - no CXR or PFT's accomplished, pt c/o no change in symptoms of DOE  - still no Pt denies chest pain, sob at rest, wheezing, orthopnea, PND, increased LE swelling, palpitations, dizziness or syncope.  Pt denies new neurological symptoms such as new headache, or facial or extremity weakness or numbness   Pt denies polydipsia, polyuria  Pt states overall good compliance with meds, trying to follow lower chol diet, wt overall stable.  Dementia overall stable symptomatically with gradual worsening at best, and not assoc with behavioral changes such as hallucinations, paranoia, or agitation.  Has not increased the metformin, and states she is taking the zocor (though son is not sure). Past Medical History  Diagnosis Date  . COLONIC POLYPS 06/15/2005  . DIABETES MELLITUS, TYPE II 04/22/2007  . HYPERCHOLESTEROLEMIA 04/22/2007  . HYPERLIPIDEMIA 05/04/2007  . Dehydration 03/22/2009  . OBESITY 04/22/2007  . ANEMIA-IRON DEFICIENCY 05/10/2009  . DEPRESSION 05/10/2009  . HYPERTENSION 04/22/2007  . ALLERGIC RHINITIS 12/30/2009  . ESOPHAGEAL STRICTURE 05/28/2004  . GERD 04/22/2007  . HIATAL HERNIA 05/28/2004  . DIVERTICULOSIS, COLON 10/24/2007  . CONSTIPATION 01/26/2008  . BACK PAIN 05/28/2009  . OSTEOPOROSIS 05/04/2007  . DIZZINESS 03/22/2009  . FATIGUE 10/24/2007  . WEIGHT LOSS 03/22/2009  . DYSPNEA 12/28/2007  . Other dysphagia 03/22/2009  . COLONIC POLYPS, HX OF 10/24/2007   Past Surgical History  Procedure Date  . Appendectomy   . Abdominal hysterectomy   . Tonsillectomy   . Adenoidectomy   . Left ankle surgury   . S/p bladder pubovaginal sling   . S/p cystocele/rectocele repair   . Laparoscopic takedown of incarcerated stomach within the chest/nissen 2009    reports that she has never smoked. She  does not have any smokeless tobacco history on file. She reports that she does not drink alcohol or use illicit drugs. family history includes Cancer in her father; Diabetes in her father and mother; Heart attack in her mother; and Heart disease in her mother. Allergies  Allergen Reactions  . Codeine   . Hydrocodone-Acetaminophen   . Oxycodone-Acetaminophen    Current Outpatient Prescriptions on File Prior to Visit  Medication Sig Dispense Refill  . aspirin 81 MG EC tablet Take 81 mg by mouth daily.        Jerrell Belfast Lancet Super Thin 30G MISC Use 1 stick as directed once daily       . insulin glargine (LANTUS) 100 UNIT/ML injection Inject 23 Units into the skin daily.  30 mL  5  . omeprazole (PRILOSEC) 40 MG capsule Take 40 mg by mouth daily.        Marland Kitchen DISCONTD: metFORMIN (GLUCOPHAGE) 500 MG tablet Take 2 tablets (1,000 mg total) by mouth 2 (two) times daily with a meal. 2 by mouth two times daily  360 tablet  0  . DISCONTD: simvastatin (ZOCOR) 40 MG tablet Take 1 tablet (40 mg total) by mouth daily.  90 tablet  3   Review of Systems All otherwise neg per pt     Objective:   Physical Exam BP 150/70  Pulse 80  Temp(Src) 97.9 F (36.6 C) (Oral)  Ht 5\' 3"  (1.6 m)  Wt 148 lb 12 oz (67.473 kg)  BMI 26.35 kg/m2  SpO2 99% Physical  Exam  VS noted Constitutional: Pt appears well-developed and well-nourished.  HENT: Head: Normocephalic.  Right Ear: External ear normal.  Left Ear: External ear normal.  Eyes: Conjunctivae and EOM are normal. Pupils are equal, round, and reactive to light.  Neck: Normal range of motion. Neck supple.  Cardiovascular: Normal rate and regular rhythm.   Pulmonary/Chest: Effort normal and breath sounds normal.  Abd:  Soft, NT, non-distended, + BS Neurological: Pt is alert. No cranial nerve deficit.  Skin: Skin is warm. No erythema.  Psychiatric: Pt behavior is normal. Thought content normal.         Assessment & Plan:

## 2011-01-27 NOTE — Assessment & Plan Note (Signed)
Due to communication problem it seems, cxr and pft's not done since last visit;  Will do cxr today, and have son with her arrange the PFT's

## 2011-01-27 NOTE — Assessment & Plan Note (Addendum)
I doubt by itself a significant cause of her DOE, pt requests referral to Dr Hochrein/card who tx her husband as well, no evidence by exam of volume overload today,  Echo and ecg reveiwed with pt, Continue all other medications as before, may need stress testing per card's

## 2011-01-27 NOTE — Assessment & Plan Note (Signed)
Uncontrolled, and states has been compliant with her zocor;  To d/c the zocor, start crestor 20 qd, f/u 4 wks

## 2011-02-12 ENCOUNTER — Ambulatory Visit (INDEPENDENT_AMBULATORY_CARE_PROVIDER_SITE_OTHER): Payer: Medicare Other | Admitting: Internal Medicine

## 2011-02-12 DIAGNOSIS — R0609 Other forms of dyspnea: Secondary | ICD-10-CM

## 2011-02-12 LAB — PULMONARY FUNCTION TEST

## 2011-02-12 NOTE — Progress Notes (Signed)
PFT done today. 

## 2011-02-16 ENCOUNTER — Encounter: Payer: Self-pay | Admitting: *Deleted

## 2011-02-16 ENCOUNTER — Encounter: Payer: Self-pay | Admitting: Cardiology

## 2011-02-16 ENCOUNTER — Ambulatory Visit (INDEPENDENT_AMBULATORY_CARE_PROVIDER_SITE_OTHER): Payer: Medicare Other | Admitting: Cardiology

## 2011-02-16 DIAGNOSIS — I10 Essential (primary) hypertension: Secondary | ICD-10-CM

## 2011-02-16 DIAGNOSIS — I200 Unstable angina: Secondary | ICD-10-CM

## 2011-02-16 DIAGNOSIS — I351 Nonrheumatic aortic (valve) insufficiency: Secondary | ICD-10-CM

## 2011-02-16 DIAGNOSIS — Z01810 Encounter for preprocedural cardiovascular examination: Secondary | ICD-10-CM

## 2011-02-16 DIAGNOSIS — R0600 Dyspnea, unspecified: Secondary | ICD-10-CM | POA: Insufficient documentation

## 2011-02-16 DIAGNOSIS — R0602 Shortness of breath: Secondary | ICD-10-CM

## 2011-02-16 DIAGNOSIS — E785 Hyperlipidemia, unspecified: Secondary | ICD-10-CM

## 2011-02-16 DIAGNOSIS — R0989 Other specified symptoms and signs involving the circulatory and respiratory systems: Secondary | ICD-10-CM

## 2011-02-16 LAB — CBC WITH DIFFERENTIAL/PLATELET
Eosinophils Relative: 2.6 % (ref 0.0–5.0)
HCT: 31.5 % — ABNORMAL LOW (ref 36.0–46.0)
Lymphs Abs: 2.7 10*3/uL (ref 0.7–4.0)
MCV: 89.5 fl (ref 78.0–100.0)
Monocytes Absolute: 0.4 10*3/uL (ref 0.1–1.0)
Platelets: 241 10*3/uL (ref 150.0–400.0)
RDW: 13.9 % (ref 11.5–14.6)
WBC: 7.1 10*3/uL (ref 4.5–10.5)

## 2011-02-16 LAB — BASIC METABOLIC PANEL
BUN: 23 mg/dL (ref 6–23)
CO2: 29 mEq/L (ref 19–32)
Chloride: 102 mEq/L (ref 96–112)
Glucose, Bld: 66 mg/dL — ABNORMAL LOW (ref 70–99)
Potassium: 3.8 mEq/L (ref 3.5–5.1)

## 2011-02-16 LAB — PROTIME-INR: Prothrombin Time: 12.1 s (ref 10.2–12.4)

## 2011-02-16 MED ORDER — NITROGLYCERIN 0.4 MG SL SUBL: 0.4000 mg | SUBLINGUAL_TABLET | SUBLINGUAL | Status: DC | PRN

## 2011-02-16 MED ORDER — METOPROLOL TARTRATE 25 MG PO TABS: 25.0000 mg | ORAL_TABLET | Freq: Three times a day (TID) | ORAL | Status: DC

## 2011-02-16 NOTE — Assessment & Plan Note (Signed)
The patient describes dyspnea and chest discomfort consistent with unstable angina. She has significant cardiovascular risk factors. I think the pretest probability of obstructive coronary disease is high. Cardiac catheterization right and left is indicated. I have discussed with her the risks and benefits and she completely understands. She agrees to proceed.

## 2011-02-16 NOTE — Patient Instructions (Addendum)
You are being scheduled for a cardiac cath.  Please follow the instruction sheet given. Please start Metoprolol 25 mg three times a day. Continue other medications as listed

## 2011-02-16 NOTE — Progress Notes (Signed)
HPI Patient presents for evaluation of dyspnea. She's had a past history of this with stress perfusion testing and echocardiogram demonstrating no significant abnormalities a couple of years ago. She did have a recent echocardiogram and I reviewed this. There was some mild to moderate aortic insufficiency.  She reports that over the past 4-6 weeks she has had increasing shortness of breath. This happens when minimal activity such as walking 20 yards on level ground. She doesn't describe chest pressure though she has a "dead feeling" in her chest. She doesn't describe neck or arm discomfort. She is not reporting palpitations, presyncope or syncope. She has had no weight gain or edema. She's had no resting shortness of breath, PND or orthopnea. She said this degree of dyspnea is more than she had previously.  Allergies  Allergen Reactions  . Codeine   . Hydrocodone-Acetaminophen   . Oxycodone-Acetaminophen     Current Outpatient Prescriptions  Medication Sig Dispense Refill  . aspirin 81 MG EC tablet Take 81 mg by mouth daily.        Jerrell Belfast Lancet Super Thin 30G MISC Use 1 stick as directed once daily       . insulin glargine (LANTUS) 100 UNIT/ML injection Inject 23 Units into the skin daily.  30 mL  5  . metFORMIN (GLUCOPHAGE) 500 MG tablet Take 1 tablet (500 mg total) by mouth 2 (two) times daily with a meal.  180 tablet  3  . rosuvastatin (CRESTOR) 20 MG tablet Take 1 tablet (20 mg total) by mouth at bedtime.  30 tablet  11  . DISCONTD: omeprazole (PRILOSEC) 40 MG capsule Take 40 mg by mouth daily.        . metoprolol tartrate (LOPRESSOR) 25 MG tablet Take 1 tablet (25 mg total) by mouth 3 (three) times daily.  90 tablet  11  . nitroGLYCERIN (NITROSTAT) 0.4 MG SL tablet Place 1 tablet (0.4 mg total) under the tongue every 5 (five) minutes as needed for chest pain.  25 tablet  3    Past Medical History  Diagnosis Date  . COLONIC POLYPS 06/15/2005  . DIABETES MELLITUS, TYPE II 04/22/2007  .  HYPERCHOLESTEROLEMIA 04/22/2007  . HYPERLIPIDEMIA 05/04/2007  . Dehydration 03/22/2009  . OBESITY 04/22/2007  . ANEMIA-IRON DEFICIENCY 05/10/2009  . DEPRESSION 05/10/2009  . HYPERTENSION 04/22/2007  . ALLERGIC RHINITIS 12/30/2009  . ESOPHAGEAL STRICTURE 05/28/2004  . GERD 04/22/2007  . HIATAL HERNIA 05/28/2004  . DIVERTICULOSIS, COLON 10/24/2007  . CONSTIPATION 01/26/2008  . BACK PAIN 05/28/2009  . OSTEOPOROSIS 05/04/2007  . DIZZINESS 03/22/2009  . FATIGUE 10/24/2007  . WEIGHT LOSS 03/22/2009  . DYSPNEA 12/28/2007  . Other dysphagia 03/22/2009  . COLONIC POLYPS, HX OF 10/24/2007    Past Surgical History  Procedure Date  . Appendectomy   . Abdominal hysterectomy   . Tonsillectomy   . Adenoidectomy   . Left ankle surgury   . S/p bladder pubovaginal sling   . S/p cystocele/rectocele repair   . Laparoscopic takedown of incarcerated stomach within the chest/nissen 2009    Family History  Problem Relation Age of Onset  . Heart disease Mother 73  . Diabetes Mother   . Heart attack Mother   . Cancer Father     colon  . Diabetes Father     History   Social History  . Marital Status: Widowed    Spouse Name: N/A    Number of Children: 2  . Years of Education: N/A   Occupational History  .  Social History Main Topics  . Smoking status: Never Smoker   . Smokeless tobacco: Not on file  . Alcohol Use: No  . Drug Use: No  . Sexually Active: Not on file   Other Topics Concern  . Not on file   Social History Narrative   Husband of  80 years died 2009-01-13.    ROS:  Positive for occasional dizziness, difficulty hearing, reflux, decreased appetite, constipation, questionable decreased memory (per family), joint pains. Otherwise as stated in the history of present illness negative for all other systems.  PHYSICAL EXAM BP 188/82  Pulse 80  Resp 16  Ht 5\' 3"  (1.6 m)  Wt 148 lb (67.132 kg)  BMI 26.22 kg/m2 GENERAL:  Well appearing HEENT:  Pupils equal round and reactive, fundi  not visualized, oral mucosa unremarkable NECK:  No jugular venous distention, waveform within normal limits, carotid upstroke brisk and symmetric, no bruits, no thyromegaly LYMPHATICS:  No cervical, inguinal adenopathy LUNGS:  Clear to auscultation bilaterally BACK:  No CVA tenderness CHEST:  Unremarkable HEART:  PMI not displaced or sustained,S1 and S2 within normal limits, no S3, no S4, no clicks, no rubs, no murmurs ABD:  Flat, positive bowel sounds normal in frequency in pitch, no bruits, no rebound, no guarding, no midline pulsatile mass, no hepatomegaly, no splenomegaly EXT:  2 plus pulses throughout, no edema, no cyanosis no clubbing SKIN:  No rashes no nodules NEURO:  Cranial nerves II through XII grossly intact, motor grossly intact throughout PSYCH:  Cognitively intact, oriented to person place and time   EKG:  12/30/10  NSR, rate 77, nonspecific ST T wave changes  ASSESSMENT AND PLAN

## 2011-02-16 NOTE — Assessment & Plan Note (Signed)
I will start with metoprolol 25 mg t.i.d. Further treatment and consolidation of this medication will be based on future blood pressure readings.

## 2011-02-16 NOTE — Assessment & Plan Note (Signed)
This is mild to moderate and we will follow it clinically.

## 2011-02-16 NOTE — Assessment & Plan Note (Signed)
For now she remains on medications as listed though I will review to see if she is at a target LDL less than 70 and HDL greater than 40.

## 2011-02-19 ENCOUNTER — Encounter: Payer: Self-pay | Admitting: Internal Medicine

## 2011-02-19 ENCOUNTER — Telehealth: Payer: Self-pay | Admitting: Cardiology

## 2011-02-19 NOTE — Telephone Encounter (Signed)
Spoke with Joni Reining from Dr [employer] office - discussed Dr Hochrein's most recent concerns for pt being at high risk for cardiac disease with s/s of unstable angina and that she is to have a cardiac cath tomorrow.  It was decided that they will wait to have tooth extraction after cardiac testing and treatment are completed.

## 2011-02-19 NOTE — Telephone Encounter (Addendum)
They wanted to know if pt could get a tooth extracted today and would like a note faxed to office fax# [phone]..the patient is in the chair.Julia KitchenMarland [name]

## 2011-02-20 ENCOUNTER — Inpatient Hospital Stay (HOSPITAL_BASED_OUTPATIENT_CLINIC_OR_DEPARTMENT_OTHER)
Admission: RE | Admit: 2011-02-20 | Discharge: 2011-02-20 | Disposition: A | Discharge status: Home / Self Care | Payer: Medicare Other | Source: Ambulatory Visit | Attending: Cardiology | Admitting: Cardiology

## 2011-02-20 DIAGNOSIS — I2510 Atherosclerotic heart disease of native coronary artery without angina pectoris: Secondary | ICD-10-CM

## 2011-02-20 DIAGNOSIS — R0989 Other specified symptoms and signs involving the circulatory and respiratory systems: Secondary | ICD-10-CM | POA: Insufficient documentation

## 2011-02-20 DIAGNOSIS — R0609 Other forms of dyspnea: Secondary | ICD-10-CM | POA: Insufficient documentation

## 2011-02-20 LAB — POCT I-STAT 3, VENOUS BLOOD GAS (G3P V): Acid-Base Excess: 2 mmol/L (ref 0.0–2.0)

## 2011-02-20 LAB — POCT I-STAT GLUCOSE
Glucose, Bld: 107 mg/dL — ABNORMAL HIGH (ref 70–99)
Operator id: 194801

## 2011-02-20 LAB — POCT I-STAT 3, ART BLOOD GAS (G3+)
Acid-base deficit: 3 mmol/L — ABNORMAL HIGH (ref 0.0–2.0)
Bicarbonate: 22.9 mEq/L (ref 20.0–24.0)

## 2011-02-25 NOTE — Cardiovascular Report (Signed)
  [name], [name] NO.:  [id number]  MEDICAL RECORD NO.:  [id number]  LOCATION:                                 FACILITY:  PHYSICIAN:  Rollene Rotunda, MD, FACCDATE OF BIRTH:  09/28/1930  DATE OF PROCEDURE:  02/20/2011 DATE OF DISCHARGE:                           CARDIAC CATHETERIZATION   PRIMARY:  Corwin Levins, MD  PROCEDURE:  Left and right heart catheterization/coronary arteriography.  INDICATIONS:  Evaluate the patient with significant dyspnea on exertion and multiple cardiovascular risk factors.  PROCEDURE NOTE:  Left heart catheterization was performed via the right femoral artery, right heart catheterization was performed via both femoral veins.  Both vessels were cannulated using an anterior wall puncture.  A #4-French arterial sheath and #7-French venous sheath were inserted via the modified Seldinger technique.  Preformed Judkins and pigtail catheter were utilized.  The patient tolerated the procedure well and left the lab in stable condition.  RESULTS:  Hemodynamics:  RA mean 1, RV 22/9, PA mean 22/6, pulmonary capillary pressure mean 2, AO 182/97, LV 191/15.  Coronaries:  The left main had distal 25% stenosis.  The LAD was a very large vessel wrapping the apex.  There was moderate proximal calcification.  Following a second diagonal, there was a mid 60% stenosis.  First diagonal was small with proximal 30% stenosis.  Second diagonal was small and branching.  The superior branch had 95% stenosis. The inferior branch was tiny and normal.  The circumflex was essentially a very small ramus intermediate with approximately 25% stenosis.  The right coronary artery was a very large vessel wrapping around the back and lateral aspect of the heart.  There were luminal irregularities. PDA was moderate-sized with luminal irregularities.  Posterolateral 1 was moderate sized and normal.  Posterolateral 2 was moderate sized and normal.  Left  ventriculogram:  The left ventriculogram was obtained in the RAO projection.  The EF was 65% with normal wall motion.  CONCLUSION:  Nonobstructive coronary artery disease visually.  However, I am concerned about the LAD lesion as it has the potential to be hemodynamically significant.  If this was a hemodynamically significant lesion, it should certainly show up on stress perfusion imaging as it perfuses the entire anterior wall and apex.  I will order a followup stress perfusion imaging.  If this is normal, I will refer her for pulmonary function testing and pulmonary consult to evaluate her significant dyspnea.     Rollene Rotunda, MD, [facility]     JH/MEDQ  D:  02/20/2011  T:  02/21/2011  Job:  [id number]  cc:   Corwin Levins, MD  Electronically Signed by Rollene Rotunda MD [facility] on 02/25/2011 11:47:22 AM

## 2011-03-03 ENCOUNTER — Telehealth: Payer: Self-pay | Admitting: Cardiology

## 2011-03-03 DIAGNOSIS — R5383 Other fatigue: Secondary | ICD-10-CM

## 2011-03-03 DIAGNOSIS — I10 Essential (primary) hypertension: Secondary | ICD-10-CM

## 2011-03-03 DIAGNOSIS — E785 Hyperlipidemia, unspecified: Secondary | ICD-10-CM

## 2011-03-03 NOTE — Telephone Encounter (Signed)
Was told by Dr. Antoine Poche to call the office and set up an stress test.

## 2011-03-04 NOTE — Telephone Encounter (Signed)
Order placed for lexiscan.

## 2011-03-05 ENCOUNTER — Ambulatory Visit: Payer: Medicare Other | Admitting: Internal Medicine

## 2011-03-09 ENCOUNTER — Ambulatory Visit: Payer: Medicare Other | Admitting: Cardiology

## 2011-03-12 ENCOUNTER — Ambulatory Visit (HOSPITAL_COMMUNITY): Payer: Medicare Other | Attending: Cardiology | Admitting: Radiology

## 2011-03-12 DIAGNOSIS — E785 Hyperlipidemia, unspecified: Secondary | ICD-10-CM

## 2011-03-12 DIAGNOSIS — R5383 Other fatigue: Secondary | ICD-10-CM

## 2011-03-12 DIAGNOSIS — R079 Chest pain, unspecified: Secondary | ICD-10-CM

## 2011-03-12 DIAGNOSIS — R0609 Other forms of dyspnea: Secondary | ICD-10-CM

## 2011-03-12 DIAGNOSIS — R0600 Dyspnea, unspecified: Secondary | ICD-10-CM

## 2011-03-12 DIAGNOSIS — R0602 Shortness of breath: Secondary | ICD-10-CM

## 2011-03-12 DIAGNOSIS — I10 Essential (primary) hypertension: Secondary | ICD-10-CM

## 2011-03-12 DIAGNOSIS — R5381 Other malaise: Secondary | ICD-10-CM | POA: Insufficient documentation

## 2011-03-12 MED ORDER — REGADENOSON 0.4 MG/5ML IV SOLN
0.4000 mg | Freq: Once | INTRAVENOUS | Status: AC
  Administered 2011-03-12: 0.4 mg via INTRAVENOUS

## 2011-03-12 MED ORDER — TECHNETIUM TC 99M TETROFOSMIN IV KIT
11.0000 | PACK | Freq: Once | INTRAVENOUS | Status: AC | PRN
  Administered 2011-03-12: 11 via INTRAVENOUS

## 2011-03-12 MED ORDER — TECHNETIUM TC 99M TETROFOSMIN IV KIT
33.0000 | PACK | Freq: Once | INTRAVENOUS | Status: AC | PRN
  Administered 2011-03-12: 33 via INTRAVENOUS

## 2011-03-12 NOTE — Progress Notes (Addendum)
[facility] SITE 3 NUCLEAR MED [address] [place] [address] [phone]  Cardiology Nuclear Med Study  NOHELI [name] is a 77 y.o. female [id number] [birth year]   Nuclear Med Background Indication for Stress Test:  Evaluation for Ischemia and Pending Surgical Clearance:for tooth extraction: Dr. Shea Evans History: 01/26/11 Echo: 55-60% mild to mod AI, 02/20/11 Heart Catheterization: EF 65% and 03/09 Myocardial Perfusion Study NL EF 73% Cardiac Risk Factors: Family History - CAD, Hypertension, IDDM Type 2 and Lipids  Symptoms:  Chest Pain, Dizziness, DOE, Fatigue and SOB   Nuclear Pre-Procedure Caffeine/Decaff Intake:  None NPO After: 8:00am   Lungs:  clear IV 0.9% NS with Angio Cath:  20g  IV Site: R Antecubital  IV Started by:  Stanton Kidney, EMT-P  Chest Size (in):  38 Cup Size: B  Height: 5\' 3"  (1.6 m)  Weight:  147 lb (66.679 kg)  BMI:  Body mass index is 26.04 kg/(m^2). Tech Comments:  Metoprolol held this am, per patient    Nuclear Med Study 1 or 2 day study: 1 day  Stress Test Type:  [name]  Reading MD: [name], MD  Order Authorizing Provider:  J.Hochrein  Resting Radionuclide: Technetium 3m Tetrofosmin  Resting Radionuclide Dose: 11 mCi   Stress Radionuclide:  Technetium 59m Tetrofosmin  Stress Radionuclide Dose: 33 mCi           Stress Protocol Rest HR: 68 Stress HR: 81  Rest BP: 188/72 Stress BP: 176/65  Exercise Time (min): n/a METS: n/a   Predicted Max HR: 142 bpm % Max HR: 57.04 bpm Rate Pressure Product: 96295   Dose of Adenosine (mg):  n/a Dose of Lexiscan: 0.4 mg  Dose of Atropine (mg): n/a Dose of Dobutamine: n/a mcg/kg/min (at max HR)  Stress Test Technologist: Milana Na, EMT-P  Nuclear Technologist:  Domenic Polite, CNMT     Rest Procedure:  Myocardial perfusion imaging was performed at rest 45 minutes following the intravenous administration of Technetium 4m Tetrofosmin. Rest ECG: NSR  Stress Procedure:   The patient received IV Lexiscan 0.4 mg over 15-seconds.  Technetium 58m Tetrofosmin injected at 30-seconds.  There were non specific changes with Lexiscan.  Quantitative spect images were obtained after a 45 minute delay. Stress ECG: [name] significant change from baseline ECG  QPS Raw Data Images:  Normal; [name] motion artifact; normal heart/lung ratio. Stress Images:  Normal homogeneous uptake in all areas of the myocardium. Rest Images:  Normal homogeneous uptake in all areas of the myocardium. Subtraction (SDS):  [name] evidence of ischemia. Transient Ischemic Dilatation (Normal <1.22):  1.06 Lung/Heart Ratio (Normal <0.45):  .32  Quantitative Gated Spect Images QGS EDV:  85 ml QGS ESV:  32 ml QGS cine images:  Normal Wall Motion QGS EF: 63%  Impression Exercise Capacity:  Lexiscan with [name] exercise. BP Response:  Normal blood pressure response. Clinical Symptoms:  SOB ECG Impression:  [name] significant ST segment change suggestive of ischemia. Comparison with Prior Nuclear Study: [name] significant change from previous study  Overall Impression:  Normal stress nuclear study.  [name]  [name] evidence of ischemia in the distribution of the CAD noted on cath.  Negative study.  Plan referral to pulmonary to evaluate dyspnea.  Rollene Rotunda

## 2011-03-13 NOTE — Progress Notes (Signed)
ROUTED TO DR. HOCHREIN.Scarlette Ar

## 2011-03-16 ENCOUNTER — Other Ambulatory Visit: Payer: Self-pay | Admitting: Internal Medicine

## 2011-03-17 NOTE — Progress Notes (Signed)
Addended by: Sharin Grave on: 03/17/2011 04:29 PM   Modules accepted: Orders

## 2011-03-17 NOTE — Progress Notes (Signed)
Pt aware of results and need for a pulmonary consult.  Also wants [place] to call Perrin Eddleman her daughter with results and to schedule appt with pulmonary. [phone]  [phone].  Daughter [name] aware of the results, plan and is agreeable.

## 2011-04-07 ENCOUNTER — Telehealth: Payer: Self-pay | Admitting: Cardiology

## 2011-04-07 NOTE — Telephone Encounter (Signed)
RN s/w Dr Warren Danes: advised that Pt had cardiac cath and lexiscan however Dr Antoine Poche is on vacation and will return on 04/20/2011. Dr Warren Danes stated he will wait for Dr Hochrein's return for clearance.

## 2011-04-07 NOTE — Telephone Encounter (Signed)
Pt needs sur clearance for dental work.

## 2011-04-08 NOTE — Telephone Encounter (Signed)
What is the extent of the dental work?  Her CAD is stable with negative stress test.  However, she is seeing pulmonary for dyspnea.  Don't think she has seen yet.  If this is elective and dentist ok to wait for Dr. Antoine Poche to get back that is ok.

## 2011-04-08 NOTE — Telephone Encounter (Signed)
Will forward to Tereso Newcomer, [place] in Dr Hochrein's absence.

## 2011-04-21 NOTE — Telephone Encounter (Signed)
I don't have any cardiovascular contraindications to dental procedures.

## 2011-04-24 NOTE — Telephone Encounter (Signed)
Spoke with Dr Hewitt Blade who is aware that there are no cardiovascular contraindications to dental work.  Records of cath, echo and PFT's faxed to Dr Warren Danes as his request.

## 2011-06-05 LAB — GLUCOSE, CAPILLARY: Glucose-Capillary: 197 — ABNORMAL HIGH

## 2011-06-05 LAB — BASIC METABOLIC PANEL
BUN: 14
Chloride: 102
GFR calc Af Amer: >60
GFR calc non Af Amer: 54 — ABNORMAL LOW
Potassium: 4.6

## 2011-06-05 LAB — HEMOGLOBIN AND HEMATOCRIT, BLOOD
HCT: 32.6 — ABNORMAL LOW
Hemoglobin: 11.3 — ABNORMAL LOW

## 2011-06-05 LAB — HEMOGLOBIN A1C: Mean Plasma Glucose: 208

## 2011-10-23 ENCOUNTER — Emergency Department (HOSPITAL_COMMUNITY): Payer: Medicare Other

## 2011-10-23 ENCOUNTER — Encounter (HOSPITAL_COMMUNITY): Payer: Self-pay | Admitting: Emergency Medicine

## 2011-10-23 ENCOUNTER — Other Ambulatory Visit: Payer: Self-pay

## 2011-10-23 ENCOUNTER — Emergency Department (HOSPITAL_COMMUNITY)
Admission: EM | Admit: 2011-10-23 | Discharge: 2011-10-23 | Disposition: A | Discharge status: Home / Self Care | Payer: Medicare Other | Attending: Emergency Medicine | Admitting: Emergency Medicine

## 2011-10-23 DIAGNOSIS — R0602 Shortness of breath: Secondary | ICD-10-CM | POA: Insufficient documentation

## 2011-10-23 DIAGNOSIS — R42 Dizziness and giddiness: Secondary | ICD-10-CM | POA: Insufficient documentation

## 2011-10-23 DIAGNOSIS — I10 Essential (primary) hypertension: Secondary | ICD-10-CM | POA: Insufficient documentation

## 2011-10-23 DIAGNOSIS — K222 Esophageal obstruction: Secondary | ICD-10-CM

## 2011-10-23 DIAGNOSIS — R490 Dysphonia: Secondary | ICD-10-CM | POA: Insufficient documentation

## 2011-10-23 DIAGNOSIS — Z794 Long term (current) use of insulin: Secondary | ICD-10-CM | POA: Insufficient documentation

## 2011-10-23 DIAGNOSIS — E119 Type 2 diabetes mellitus without complications: Secondary | ICD-10-CM | POA: Insufficient documentation

## 2011-10-23 DIAGNOSIS — R799 Abnormal finding of blood chemistry, unspecified: Secondary | ICD-10-CM | POA: Insufficient documentation

## 2011-10-23 LAB — CBC
MCH: 29.5 pg (ref 26.0–34.0)
MCHC: 33 g/dL (ref 30.0–36.0)
Platelets: 225 10*3/uL (ref 150–400)
RBC: 3.83 MIL/uL — ABNORMAL LOW (ref 3.87–5.11)
RDW: 13.4 % (ref 11.5–15.5)

## 2011-10-23 LAB — BASIC METABOLIC PANEL
Calcium: 10.1 mg/dL (ref 8.4–10.5)
Creatinine, Ser: 0.94 mg/dL (ref 0.50–1.10)
GFR calc Af Amer: 66 mL/min — ABNORMAL LOW (ref >90)
GFR calc non Af Amer: 57 mL/min — ABNORMAL LOW (ref >90)
Sodium: 136 mEq/L (ref 135–145)

## 2011-10-23 LAB — PRO B NATRIURETIC PEPTIDE: Pro B Natriuretic peptide (BNP): 444.8 pg/mL (ref 0–450)

## 2011-10-23 LAB — POCT I-STAT TROPONIN I: Troponin i, poc: 0 ng/mL (ref 0.00–0.08)

## 2011-10-23 MED ORDER — PANTOPRAZOLE SODIUM 40 MG PO TBEC: 40.0000 mg | DELAYED_RELEASE_TABLET | Freq: Every day | ORAL | Status: DC

## 2011-10-23 MED ORDER — IOHEXOL 300 MG/ML  SOLN
100.0000 mL | Freq: Once | INTRAMUSCULAR | Status: AC | PRN
  Administered 2011-10-23: 80 mL via INTRAVENOUS

## 2011-10-23 MED ORDER — SODIUM CHLORIDE 0.9 % IV BOLUS (SEPSIS): 500.0000 mL | Freq: Once | INTRAVENOUS | Status: DC

## 2011-10-23 NOTE — ED Provider Notes (Signed)
History     CSN: [id number]  Arrival date & time 10/23/11  1536   First MD Initiated Contact with Patient 10/23/11 1638      Chief Complaint  Patient presents with  . Shortness of Breath    (Consider location/radiation/quality/duration/timing/severity/associated sxs/prior treatment) Patient is a 78 y.o. female presenting with shortness of breath. The history is provided by the patient and a relative.  Shortness of Breath  The current episode started more than 2 weeks ago. The onset was gradual. The problem occurs occasionally. The problem has been gradually worsening. The problem is moderate. The symptoms are relieved by rest. The symptoms are aggravated by activity. Associated symptoms include shortness of breath. Pertinent negatives include no chest pain, no chest pressure, no orthopnea, no fever, no rhinorrhea, no sore throat, no cough and no wheezing. Associated symptoms comments: Positive intermittent voice loss/hoarseness. There was no intake of a foreign body. She has not inhaled smoke recently. She has had no prior steroid use. Her past medical history does not include asthma. Urine output has been normal. She has received no recent medical care.  Pt with hx of intermittent dyspnea with cardiology and pulmonary evaluations last year. Increasing DOE in last few weeks. Difficulty ambulating to mailbox, which was not a difficult task 1 week ago.   Family also reports concerns about chronic memory loss, which has been gradually increasing for at least the last year. This has been previously evaluated by her primary doctor.  Past Medical History  Diagnosis Date  . COLONIC POLYPS 06/15/2005  . DIABETES MELLITUS, TYPE II 04/22/2007  . HYPERCHOLESTEROLEMIA 04/22/2007  . HYPERLIPIDEMIA 05/04/2007  . Dehydration 03/22/2009  . OBESITY 04/22/2007  . ANEMIA-IRON DEFICIENCY 05/10/2009  . DEPRESSION 05/10/2009  . HYPERTENSION 04/22/2007  . ALLERGIC RHINITIS 12/30/2009  . ESOPHAGEAL STRICTURE 05/28/2004   . GERD 04/22/2007  . HIATAL HERNIA 05/28/2004  . DIVERTICULOSIS, COLON 10/24/2007  . CONSTIPATION 01/26/2008  . BACK PAIN 05/28/2009  . OSTEOPOROSIS 05/04/2007  . DIZZINESS 03/22/2009  . FATIGUE 10/24/2007  . WEIGHT LOSS 03/22/2009  . DYSPNEA 12/28/2007  . Other dysphagia 03/22/2009  . COLONIC POLYPS, HX OF 10/24/2007    Past Surgical History  Procedure Date  . Appendectomy   . Abdominal hysterectomy   . Tonsillectomy   . Adenoidectomy   . Left ankle surgury   . S/p bladder pubovaginal sling   . S/p cystocele/rectocele repair   . Laparoscopic takedown of incarcerated stomach within the chest/nissen 2009    Family History  Problem Relation Age of Onset  . Heart disease Mother 31  . Diabetes Mother   . Heart attack Mother   . Cancer Father     colon  . Diabetes Father     History  Substance Use Topics  . Smoking status: Never Smoker   . Smokeless tobacco: Not on file  . Alcohol Use: No    Review of Systems  Constitutional: Negative for fever, chills and unexpected weight change.  HENT: Positive for voice change. Negative for sore throat, rhinorrhea, neck pain, neck stiffness and tinnitus.   Eyes: Negative for pain and visual disturbance.  Respiratory: Positive for shortness of breath. Negative for cough, chest tightness and wheezing.   Cardiovascular: Negative for chest pain, orthopnea and leg swelling.  Gastrointestinal: Negative for nausea, vomiting and abdominal pain.  Genitourinary: Negative for dysuria and decreased urine volume.  Musculoskeletal: Negative for back pain and gait problem.  Skin: Negative for rash.  Neurological: Negative for syncope and speech difficulty.  Dizziness: intermittent dizziness, absent at time of examination.    Allergies  Codeine; Hydrocodone-acetaminophen; and Oxycodone-acetaminophen  Home Medications   Current Outpatient Rx  Name Route Sig Dispense Refill  . ASPIRIN 81 MG PO TBEC Oral Take 81 mg by mouth daily.      Jerrell Belfast  LANCET SUPER THIN 30G MISC  Use 1 stick as directed once daily     . INSULIN GLARGINE 100 UNIT/ML [place] SOLN Subcutaneous Inject 40 Units into the skin daily.    Marland Kitchen METFORMIN HCL 500 MG PO TABS Oral Take 250 mg by mouth 2 (two) times daily with a meal.    . METOPROLOL TARTRATE 25 MG PO TABS Oral Take 1 tablet (25 mg total) by mouth 3 (three) times daily. 90 tablet 11  . ONE-A-DAY WOMENS 50 PLUS PO Oral Take 1 tablet by mouth daily.    Marland Kitchen NITROGLYCERIN 0.4 MG SL SUBL Sublingual Place 1 tablet (0.4 mg total) under the tongue every 5 (five) minutes as needed for chest pain. 25 tablet 3  . ONETOUCH ULTRA BLUE VI STRP  USE ONE TEST STRIP  EVERY DAY 100 each 10  . ROSUVASTATIN CALCIUM 20 MG PO TABS Oral Take 1 tablet (20 mg total) by mouth at bedtime. 30 tablet 11    BP 188/81  Pulse 73  Temp(Src) 97.9 F (36.6 C) (Oral)  Resp 18  Ht 5\' 3"  (1.6 m)  Wt 134 lb (60.782 kg)  BMI 23.74 kg/m2  SpO2 98%  Physical Exam  Constitutional: She is oriented to person, place, and time. She appears well-developed and well-nourished.       Vital signs reviewed and are significant for hypertension  HENT:  Head: Normocephalic and atraumatic.  Right Ear: External ear normal.  Left Ear: External ear normal.  Mouth/Throat: Oropharynx is clear and moist.       Voice alternates between hoarse and whispering  Eyes: EOM are normal. Pupils are equal, round, and reactive to light.  Neck: Normal range of motion. Neck supple.  Cardiovascular: Normal rate, regular rhythm and normal heart sounds.   Pulmonary/Chest: Effort normal and breath sounds normal. No respiratory distress. She has no wheezes. She has no rales. She exhibits no tenderness.  Abdominal: Soft. Bowel sounds are normal. She exhibits no distension. There is no tenderness.  Musculoskeletal: She exhibits no edema and no tenderness.  Neurological: She is alert and oriented to person, place, and time. No cranial nerve deficit (F-N intact bilaterally).  Skin:  Skin is warm and dry.  Psychiatric: She has a normal mood and affect.    ED Course  Procedures (including critical care time)  Labs Reviewed  CBC - Abnormal; Notable for the following:    RBC 3.83 (*)    Hemoglobin 11.3 (*)    HCT 34.2 (*)    All other components within normal limits  BASIC METABOLIC PANEL - Abnormal; Notable for the following:    Glucose, Bld 157 (*)    BUN 27 (*)    GFR calc non Af Amer 57 (*)    GFR calc Af Amer 66 (*)    All other components within normal limits  D-DIMER, QUANTITATIVE - Abnormal; Notable for the following:    D-Dimer, Quant 0.74 (*)    All other components within normal limits  GLUCOSE, CAPILLARY - Abnormal; Notable for the following:    Glucose-Capillary 48 (*)    All other components within normal limits  PRO B NATRIURETIC PEPTIDE  POCT I-STAT TROPONIN I  Dg Chest 2 View  10/23/2011  *RADIOLOGY REPORT*  Clinical Data: Shortness of breath  CHEST - 2 VIEW  Comparison: 01/27/2011  Findings: Cardiomediastinal silhouette is stable.  No acute infiltrate or pleural effusion.  No pulmonary edema.  Thoracic dextroscoliosis.  IMPRESSION: No active disease.  No significant change.  Dextroscoliosis.  Original Report Authenticated By: Natasha Mead, M.D.        MDM  Dyspnea, gradually increasing in last few weeks, esp worse today. Assoc voice changes but no other symptoms.  Bloodwork, CXR, EKG ordered.   5:40 PM Orders for ambulatory pulseOx and D-dimer placed. No remarkable findings on preliminary labwork.  7:30 PM Elevated d-dimer. CTA chest ordered.   8:27 PM Pt with episode of hypoglycemia. Food given. Awaiting CTA chest result. EDP will continue to follow.        Shaaron Adler, [place] 10/23/11 2028

## 2011-10-23 NOTE — ED Notes (Addendum)
Pt reports ongoing shortness of breath for three weeks which has become progressively worse; pt has difficulty talking or ambulating due to this. Pt denies chest pain, nausea. Lung sounds are clear bilaterally and respirations are regular at rest. Pt has a past history of hernia that pushed on her diaphragm and was corrected by surgery.  Daughter reports that pt has been dizzy and falling at home but does not want it talked about.

## 2011-10-23 NOTE — ED Notes (Signed)
Family at bedside.  Pt resting.  Warm blanket provided.  No other needs expressed.

## 2011-10-23 NOTE — Discharge Instructions (Signed)
Esophageal Stricture The esophagus is the long, narrow tube which carries food and liquid from the mouth to the stomach. Sometimes a part of the esophagus becomes narrow and makes it difficult, painful, or even impossible to swallow. This is called an esophageal stricture.  CAUSES  Common causes of blockage or strictures of the esophagus are:  Exposure of the lower esophagus to the acid from the stomach may cause narrowing.   Hiatal hernia in which a small part of the stomach bulges up through the diaphragm can cause a narrowing in the bottom of the esophagus.   Scleroderma is a tissue disorder that affects the esophagus and makes swallowing difficult.   Achalasia is an absence of nerves in the lower esophagus and to the esophageal sphincter. This absence of nerves may be congenital (present since birth). This can cause irregular spasms which do not allow food and fluid through.   Strictures may develop from swallowing materials which damage the esophagus. Examples are acids or alkalis such as lye.   Schatzki's Ring is a narrow ring of non-cancerous tissue which narrows the lower esophagus. The cause of this is unknown.   Growths can block the esophagus.  SYMPTOMS  Some of the problems are difficulty swallowing or pain with swallowing. DIAGNOSIS  Your caregiver often suspects this problem by taking a medical history. They will also do a physical exam. They may then take X-rays and/or perform an endoscopy. Endoscopy is an exam in which a tube like a small flexible telescope is used to look at your esophagus.  TREATMENT  One form of treatment is to dilate the narrow area. This means to stretch it.   When this is not successful, chest surgery may be required. This is a much more extensive form of treatment with a longer recovery time.  Both of the above treatments make the passage of food and water into the stomach easier. They also make it easier for stomach contents to bubble back into the  esophagus. Special medications may be used following the procedure to help prevent further narrowing. Medications may be used to lower the amount of acid in the stomach juice.  SEEK IMMEDIATE MEDICAL CARE IF:   Your swallowing is becoming more painful, difficult, or you are unable to swallow.   You vomit up blood.   You develop black tarry stools.   You develop chills.   You have a fever.   You develop chest or abdominal pain.   You develop shortness of breath, feel lightheaded, or faint.  Follow up with medical care as your caregiver suggests. Document Released: 05/04/2006 Document Revised: 05/06/2011 Document Reviewed: 06/10/2006 [facility] Patient Information 2012 [place], [place].

## 2011-10-23 NOTE — ED Notes (Signed)
Family at bedside assisting pt eat peanut butter crackers and OJ.  PA-Stephanie W made aware.  Pt is fully axo, but stated she was feeling weird.

## 2011-10-23 NOTE — ED Provider Notes (Signed)
Medical screening examination/treatment/procedure(s) were conducted as a shared visit with non-physician practitioner(s) and myself.  I personally evaluated the patient during the encounter   Celene Kras, MD 10/23/11 2158

## 2011-10-23 NOTE — ED Provider Notes (Signed)
Medical screening examination/treatment/procedure(s) were conducted as a shared visit with non-physician practitioner(s) and myself.  I personally evaluated the patient during the encounter  Pt complains of episodes of shortness of breath.  She states it has been getting worse over the last couple of weeks.  Previously she has seen cardiology as well as pulmonary.  This morning she had trouble with talking so her son had her come in today.  Pt states she has trouble voicing words when the shortness of breath episode starts.  On exam, no wheezing.  Lungs clear.  Voice is hoarse.   Question whether there is some issue affecting her vocal cords causing intermittent obstruction.  Does not appear to be in distress at this point.  Dg Chest 2 View  10/23/2011  *RADIOLOGY REPORT*  Clinical Data: Shortness of breath  CHEST - 2 VIEW  Comparison: 01/27/2011  Findings: Cardiomediastinal silhouette is stable.  No acute infiltrate or pleural effusion.  No pulmonary edema.  Thoracic dextroscoliosis.  IMPRESSION: No active disease.  No significant change.  Dextroscoliosis.  Original Report Authenticated By: Natasha Mead, M.D.   Ct Angio Chest W/cm &/or Wo Cm  10/23/2011  *RADIOLOGY REPORT*  Clinical Data: Shortness of breath  CT ANGIOGRAPHY CHEST  Technique:  Multidetector CT imaging of the chest using the standard protocol during bolus administration of intravenous contrast. Multiplanar reconstructed images including MIPs were obtained and reviewed to evaluate the vascular anatomy.  Contrast: 80mL OMNIPAQUE IOHEXOL 300 MG/ML IV SOLN .  Comparison: Chest radiograph 10/23/2011  Findings: The thoracic esophagus is abnormally dilated and fluid- filled.  The fluid within the esophagus is seen from the level of the lower thoracic esophagus at the level of the upper thoracic esophagus, to the T3 vertebral body.  The esophagus is distended to 2 cm AP diameter. There are postsurgical changes of Nissen fundoplication.  There is  atherosclerotic calcified and noncalcified plaque involving the thoracic aorta.  There is atherosclerotic calcification of the coronary arteries.  Heart size is  slightly prominent, with slight enlargement of the left atrium.  Satisfactory opacification of the pulmonary arterial tree.  No focal filling defects are identified in the pulmonary arteries to suggest pulmonary embolism.  No supraclavicular, mediastinal, hilar, or axillary lymphadenopathy.  Mild dependent atelectasis in the lower lobes bilaterally.  No airspace disease, no airspace disease, interstitial abnormality, or pneumothorax.  The trachea mainstem bronchi are patent.  No acute or suspicious bony abnormality.  IMPRESSION:  1. The thoracic esophagus is dilated and fluid-filled.  There are postsurgical changes of Nissen fundoplication.  Question if there could be a relative of stenosis/partial obstruction of the distal esophagus resulting in some stasis of esophageal contents and esophageal distention.  Findings discussed with Dr. Lynelle Doctor.  Follow- up outpatient esophagram may be useful. 2.  Negative for pulmonary embolism. No acute findings in the lungs.                                   3.  Coronary artery and thoracic aorta atherosclerosis.  Original Report Authenticated By: Britta Mccreedy, M.D.    I have discussed the findings with the patient and her family. I suspect that she may be having issues with esophageal reflux which could be causing some vocal cord inflammation.  She has noticed in retrospect that she has been having more esophageal reflux symptoms. She does have history of having esophageal stricture and prior dilatation procedure. The CT  scan does show evidence of her dilated esophagus. There is no evidence of pulmonary embolism or pneumonia. She is alert he had a cardiac and pulmonary workup. I will start the patient on antacids again have encouraged her to followup with her GI doctor for further evaluation.   Celene Kras,  MD 10/23/11 2155

## 2011-11-26 ENCOUNTER — Ambulatory Visit: Payer: Medicare Other | Admitting: Gastroenterology

## 2011-11-30 ENCOUNTER — Encounter: Payer: Self-pay | Admitting: *Deleted

## 2011-12-03 ENCOUNTER — Ambulatory Visit (INDEPENDENT_AMBULATORY_CARE_PROVIDER_SITE_OTHER): Payer: Medicare Other | Admitting: Gastroenterology

## 2011-12-03 ENCOUNTER — Encounter: Payer: Self-pay | Admitting: Gastroenterology

## 2011-12-03 DIAGNOSIS — Z9889 Other specified postprocedural states: Secondary | ICD-10-CM

## 2011-12-03 DIAGNOSIS — Z86010 Personal history of colonic polyps: Secondary | ICD-10-CM

## 2011-12-03 DIAGNOSIS — R1310 Dysphagia, unspecified: Secondary | ICD-10-CM | POA: Insufficient documentation

## 2011-12-03 DIAGNOSIS — Z800 Family history of malignant neoplasm of digestive organs: Secondary | ICD-10-CM

## 2011-12-03 DIAGNOSIS — K5730 Diverticulosis of large intestine without perforation or abscess without bleeding: Secondary | ICD-10-CM

## 2011-12-03 NOTE — Progress Notes (Signed)
This is a nice 78 year old Caucasian female with multiple medical problems including coronary artery disease and insulin-dependent diabetes. She is status post laparoscopic fundoplication by Dr. Luretha Murphy in 2009 because of refractory acid reflux and a large prolapsing hiatal hernia. She was seen 2 years ago at which time endoscopy was recommended, but apparently this was not completed. In any case, she now complains of intermittent solid food dysphagia in her upper mid substernal area. She denies dysphagia for liquids, acid reflux, aspiration, or abdominal pain. Recently she was in the emergency room because of shortness of breath and coughing, and was placed on Protonix 40 mg a day without improvement. Because of her shortness of breath chronic cough, she has had involved cardiovascular and pulmonary evaluations which otherwise have been unremarkable. The patient is having regular bowel movements without melena or hematochezia. She has a family history of colon cancer her father, has history of recurrent colon polyposis, and is due for followup colonoscopy exam. Diagnoses include diverticulosis, possible diabetic gastroparesis, hypertension and mild renal insufficiency. Previous ultrasounds have shown no evidence of cholelithiasis. The patient denies right upper quadrant pain, or other symptoms of gallbladder or hepatic disease. There is no evidence of previous hepatitis or pancreatitis. Despite all these complaints her appetite is good and her weight is stable.  Current Medications, Allergies, Past Medical History, Past Surgical History, Family History and Social History were reviewed in [employer] record.  Pertinent Review of Systems Negative... increased stress over the death of her husband 3 years ago. She is on insulin with fairly good diabetic control per Dr. Oliver Barre. Patient also is on aspirin 81 mg a day. Besides shortness of breath, I can get no other cardiovascular or  pulmonary complaints at this time.  Physical Exam: Healthy-appearing patient in no acute distress. Blood pressure today 136/58 and pulse 76 and regular. BMI 26.28. I cannot appreciate stigmata of chronic liver disease, thyromegaly, lymphadenopathy, examination the oropharynx is unremarkable. Her chest seems clear and she appears to be a regular rhythm without significant murmurs gallops or rubs. There is no organomegaly, abdominal masses or tenderness. Bowel sounds are normal. Mental status is normal.    Assessment and Plan: Complicated elderly patient with multiple medical problems who is had previous fundoplication surgery, and now presents with progressive solid food dysphagia with associated shortness of breath. I have scheduled barium swallow to exclude extrinsic compression of her esophagus or other structural problems. She probably will need endoscopy, also followup colonoscopy with changes in her diabetic medications for these procedures. Review of her labs shows a mild normochromic anemia. View of her recent CT chest angiogram does show a dilated thoracic esophagus. Cannot find evidence of previous esophageal manometry, and is certainly possible that she has an underlying esophageal motility disturbance associated with her diabetes , ?? Achalasia. She probably will need also attempts at esophageal manometry. However, initially, we will proceed with barium swallow exam. Encounter Diagnosis  Name Primary?  Marland Kitchen Dysphagia Yes

## 2011-12-03 NOTE — Patient Instructions (Signed)
Your barium swallow is scheduled for 12/07/2011 please arrive at 10:45am at [facility]. Make sure you have nothing to eat or drink after midnight.

## 2011-12-07 ENCOUNTER — Ambulatory Visit (HOSPITAL_COMMUNITY)
Admission: RE | Admit: 2011-12-07 | Discharge: 2011-12-07 | Disposition: A | Discharge status: Home / Self Care | Payer: Medicare Other | Source: Ambulatory Visit | Attending: Gastroenterology | Admitting: Gastroenterology

## 2011-12-07 DIAGNOSIS — R1110 Vomiting, unspecified: Secondary | ICD-10-CM | POA: Insufficient documentation

## 2011-12-07 DIAGNOSIS — R1310 Dysphagia, unspecified: Secondary | ICD-10-CM | POA: Insufficient documentation

## 2011-12-07 DIAGNOSIS — K449 Diaphragmatic hernia without obstruction or gangrene: Secondary | ICD-10-CM | POA: Insufficient documentation

## 2011-12-08 ENCOUNTER — Other Ambulatory Visit: Payer: Self-pay | Admitting: Gastroenterology

## 2011-12-10 ENCOUNTER — Other Ambulatory Visit: Payer: Self-pay | Admitting: Gastroenterology

## 2011-12-10 DIAGNOSIS — Z9889 Other specified postprocedural states: Secondary | ICD-10-CM

## 2011-12-21 ENCOUNTER — Encounter (HOSPITAL_COMMUNITY): Admission: RE | Payer: Self-pay | Source: Ambulatory Visit

## 2011-12-21 ENCOUNTER — Ambulatory Visit (HOSPITAL_COMMUNITY)
Admission: RE | Admit: 2011-12-21 | Discharge: 2011-12-21 | Disposition: A | Discharge status: Home / Self Care | Payer: Medicare Other | Source: Ambulatory Visit | Attending: Gastroenterology | Admitting: Gastroenterology

## 2011-12-21 ENCOUNTER — Encounter (HOSPITAL_COMMUNITY)
Admission: RE | Disposition: A | Discharge status: Home / Self Care | Payer: Self-pay | Source: Ambulatory Visit | Attending: Gastroenterology

## 2011-12-21 ENCOUNTER — Ambulatory Visit (HOSPITAL_COMMUNITY): Admission: RE | Admit: 2011-12-21 | Payer: Medicare Other | Source: Ambulatory Visit | Admitting: Gastroenterology

## 2011-12-21 DIAGNOSIS — K449 Diaphragmatic hernia without obstruction or gangrene: Secondary | ICD-10-CM | POA: Insufficient documentation

## 2011-12-21 DIAGNOSIS — R1310 Dysphagia, unspecified: Secondary | ICD-10-CM | POA: Insufficient documentation

## 2011-12-21 HISTORY — PX: ESOPHAGEAL MANOMETRY: SHX5429

## 2011-12-21 LAB — GLUCOSE, CAPILLARY: Glucose-Capillary: 102 mg/dL — ABNORMAL HIGH (ref 70–99)

## 2011-12-21 SURGERY — MANOMETRY, ESOPHAGUS
Anesthesia: Choice

## 2011-12-21 MED ORDER — LIDOCAINE VISCOUS 2 % MT SOLN
OROMUCOSAL | Status: AC
  Filled 2011-12-21: qty 15

## 2011-12-22 ENCOUNTER — Encounter (HOSPITAL_COMMUNITY): Payer: Self-pay | Admitting: Gastroenterology

## 2011-12-22 ENCOUNTER — Encounter (HOSPITAL_COMMUNITY): Payer: Self-pay

## 2012-03-01 ENCOUNTER — Other Ambulatory Visit (INDEPENDENT_AMBULATORY_CARE_PROVIDER_SITE_OTHER): Payer: Medicare Other

## 2012-03-01 ENCOUNTER — Other Ambulatory Visit: Payer: Self-pay | Admitting: Internal Medicine

## 2012-03-01 ENCOUNTER — Telehealth: Payer: Self-pay | Admitting: Gastroenterology

## 2012-03-01 ENCOUNTER — Ambulatory Visit (INDEPENDENT_AMBULATORY_CARE_PROVIDER_SITE_OTHER): Payer: Medicare Other | Admitting: Internal Medicine

## 2012-03-01 ENCOUNTER — Encounter: Payer: Self-pay | Admitting: Internal Medicine

## 2012-03-01 ENCOUNTER — Other Ambulatory Visit: Payer: Self-pay

## 2012-03-01 VITALS — BP 138/62 | HR 83 | Temp 97.8°F | Ht 62.0 in | Wt 149.4 lb

## 2012-03-01 DIAGNOSIS — F22 Delusional disorders: Secondary | ICD-10-CM

## 2012-03-01 DIAGNOSIS — R410 Disorientation, unspecified: Secondary | ICD-10-CM

## 2012-03-01 DIAGNOSIS — R5381 Other malaise: Secondary | ICD-10-CM

## 2012-03-01 DIAGNOSIS — E119 Type 2 diabetes mellitus without complications: Secondary | ICD-10-CM

## 2012-03-01 DIAGNOSIS — F29 Unspecified psychosis not due to a substance or known physiological condition: Secondary | ICD-10-CM

## 2012-03-01 DIAGNOSIS — R5383 Other fatigue: Secondary | ICD-10-CM

## 2012-03-01 DIAGNOSIS — R490 Dysphonia: Secondary | ICD-10-CM

## 2012-03-01 DIAGNOSIS — E538 Deficiency of other specified B group vitamins: Secondary | ICD-10-CM

## 2012-03-01 LAB — BASIC METABOLIC PANEL
CO2: 27 mEq/L (ref 19–32)
Calcium: 9.6 mg/dL (ref 8.4–10.5)
Chloride: 103 mEq/L (ref 96–112)
Creatinine, Ser: 1 mg/dL (ref 0.4–1.2)
Glucose, Bld: 70 mg/dL (ref 70–99)

## 2012-03-01 LAB — URINALYSIS, ROUTINE W REFLEX MICROSCOPIC
Nitrite: POSITIVE
Specific Gravity, Urine: 1.01 (ref 1.000–1.030)
Urobilinogen, UA: 0.2 (ref 0.0–1.0)

## 2012-03-01 LAB — HEPATIC FUNCTION PANEL
ALT: 18 U/L (ref 0–35)
Albumin: 3.8 g/dL (ref 3.5–5.2)
Bilirubin, Direct: 0 mg/dL (ref 0.0–0.3)
Total Protein: 7.4 g/dL (ref 6.0–8.3)

## 2012-03-01 LAB — CBC WITH DIFFERENTIAL/PLATELET
Basophils Absolute: 0 10*3/uL (ref 0.0–0.1)
Basophils Relative: 0.5 % (ref 0.0–3.0)
Eosinophils Absolute: 0.1 10*3/uL (ref 0.0–0.7)
Hemoglobin: 10.7 g/dL — ABNORMAL LOW (ref 12.0–15.0)
Lymphocytes Relative: 29.8 % (ref 12.0–46.0)
MCHC: 32.7 g/dL (ref 30.0–36.0)
Monocytes Relative: 5.8 % (ref 3.0–12.0)
Neutrophils Relative %: 61.7 % (ref 43.0–77.0)
RBC: 3.59 Mil/uL — ABNORMAL LOW (ref 3.87–5.11)
RDW: 13.8 % (ref 11.5–14.6)

## 2012-03-01 MED ORDER — LANCETS MISC: 1.0000 "application " | Freq: Every day | Status: DC

## 2012-03-01 MED ORDER — LANCETS MISC: Status: DC

## 2012-03-01 MED ORDER — CEPHALEXIN 500 MG PO CAPS: 500.0000 mg | ORAL_CAPSULE | Freq: Four times a day (QID) | ORAL | Status: AC

## 2012-03-01 NOTE — Patient Instructions (Addendum)
Please call Dr Norval Gable office for the manometry results interpretation You will be contacted regarding the referral for: ENT for the hoarseness, neurology and MRI Please go to LAB in the Basement for the blood and/or urine tests to be done today You will be contacted by phone if any changes need to be made immediately.  Otherwise, you will receive a letter about your results with an explanation. Please return in 6 months, or sooner if needed

## 2012-03-01 NOTE — Assessment & Plan Note (Signed)
?   Early dementia, for b12, rpr, MRI head, and general labs, also refer neurology, if has dementia I should think the paranoia is unusual for this early in the course

## 2012-03-01 NOTE — Assessment & Plan Note (Addendum)
?   Delusional d/o vs other, mild, hold med for now, consider psychiatric referral if neg eval o/w  Note; time for pt hx and exam, review of record with pt and daughter in the room, determination of diagnoses and plan for further eval and tx is > 40 min

## 2012-03-01 NOTE — Assessment & Plan Note (Signed)
New recent worsening, no obvious cause, for ENT eval

## 2012-03-01 NOTE — Progress Notes (Signed)
Subjective:    Patient ID: [name], female    DOB: December 30, 1930, 78 y.o.   MRN: [id number]  HPI  Here to f/u; famly states pt having behavioral symptoms of paranoia and bizaare statements/confusion, some agitation assoc, and some worsening memory issue in the psat 6 mo to 1 yr.  States someone altered a dress in her closet.  Known name, states February 18, 2012 , and [employer] is president after < 30 sec of recall.  Denies worsening depressive symptoms, suicidal ideation, or panic, though has ongoing anxiety.     Also with recurring hoarseness worse in the past 6 mo as well for unclear reasons.  Lives alone, manages her own meds and knows the purpsoe of most of her medication.  Admits to missing some of her dosing. ,  Pt denies chest pain, increased sob or doe though has some at baseline, wheezing, orthopnea, PND, increased LE swelling, palpitations, dizziness or syncope.  Pt denies new neurological symptoms such as new headache, or facial or extremity weakness or numbness   Pt denies polydipsia, polyuria, or low sugar symptoms such as weakness or confusion improved with po intake.  Pt states overall good compliance with meds, trying to follow lower cholesterol, diabetic diet, wt overall stable but little exercise however.  Pt denies fever, wt loss, night sweats, loss of appetite, or other constitutional symptoms.   Needs refills on lancet. Past Medical History  Diagnosis Date  . COLONIC POLYPS 06/15/2005  . DIABETES MELLITUS, TYPE II 04/22/2007  . HYPERCHOLESTEROLEMIA   . HYPERLIPIDEMIA   . OBESITY   . ANEMIA-IRON DEFICIENCY   . DEPRESSION   . HYPERTENSION   . ALLERGIC RHINITIS   . ESOPHAGEAL STRICTURE   . GERD   . HIATAL HERNIA   . DIVERTICULOSIS, COLON   . BACK PAIN   . OSTEOPOROSIS   . DIZZINESS   . WEIGHT LOSS   . DYSPNEA   . Other dysphagia   . COLONIC POLYPS, HX OF 04/03/2002 & 06/15/2005    TUBULAR ADENOMA   Past Surgical History  Procedure Date  . Appendectomy   . Abdominal  hysterectomy   . Tonsillectomy and adenoidectomy   . Left ankle surgury   . S/p bladder pubovaginal sling   . S/p cystocele/rectocele repair   . Laparoscopic takedown of incarcerated stomach within the chest/nissen 2009  . Esophageal manometry 12/21/2011    Procedure: ESOPHAGEAL MANOMETRY (EM);  Surgeon: Mardella Layman, MD;  Location: [facility];  Service: Endoscopy;  Laterality: N/A;    reports that she has never smoked. She has never used smokeless tobacco. She reports that she does not drink alcohol or use illicit drugs. family history includes Colon cancer in her father; Diabetes in her father and mother; Heart attack in her mother; and Heart disease (age of onset:70) in her mother. Allergies  Allergen Reactions  . Codeine Nausea And Vomiting and Rash  . Hydrocodone-Acetaminophen Nausea And Vomiting and Rash  . Oxycodone-Acetaminophen Nausea And Vomiting and Rash   Current Outpatient Prescriptions on File Prior to Visit  Medication Sig Dispense Refill  . aspirin 81 MG EC tablet Take 81 mg by mouth daily.        . insulin glargine (LANTUS) 100 UNIT/ML injection Inject 40 Units into the skin daily.      . metFORMIN (GLUCOPHAGE) 500 MG tablet Take 250 mg by mouth 2 (two) times daily with a meal.      . Multiple Vitamins-Minerals (ONE-A-DAY WOMENS 50 PLUS PO) Take  1 tablet by mouth daily.      . ONE TOUCH ULTRA TEST test strip USE ONE TEST STRIP  EVERY DAY  100 each  10  . metoprolol tartrate (LOPRESSOR) 25 MG tablet Take 1 tablet (25 mg total) by mouth 3 (three) times daily.  90 tablet  11  . nitroGLYCERIN (NITROSTAT) 0.4 MG SL tablet Place 1 tablet (0.4 mg total) under the tongue every 5 (five) minutes as needed for chest pain.  25 tablet  3  . pantoprazole (PROTONIX) 40 MG tablet Take 1 tablet (40 mg total) by mouth daily.  14 tablet  1  . rosuvastatin (CRESTOR) 20 MG tablet Take 1 tablet (20 mg total) by mouth at bedtime.  30 tablet  11   Review of Systems Review of Systems    Constitutional: Negative for diaphoresis and unexpected weight change. .   Eyes: Negative for photophobia and visual disturbance.  Respiratory: Negative for choking and stridor.   Gastrointestinal: Negative for vomiting and blood in stool.  Genitourinary: Negative for hematuria and decreased urine volume.  Musculoskeletal: Negative for gait problem.  Skin: Negative for color change and wound.  Neurological: Negative for tremors and numbness.  Psychiatric/Behavioral: Negative for decreased concentration.    Objective:   Physical Exam BP 138/62  Pulse 83  Temp 97.8 F (36.6 C) (Oral)  Ht 5\' 2"  (1.575 m)  Wt 149 lb 6 oz (67.756 kg)  BMI 27.32 kg/m2  SpO2 97% Physical Exam  VS noted Constitutional: Pt appears well-developed and well-nourished.  HENT: Head: Normocephalic.  Right Ear: External ear normal.  Left Ear: External ear normal.  Eyes: Conjunctivae and EOM are normal. Pupils are equal, round, and reactive to light.  Neck: Normal range of motion. Neck supple.  Cardiovascular: Normal rate and regular rhythm.   Pulmonary/Chest: Effort normal and breath sounds normal.  Abd:  Soft, NT, non-distended, + BS Neurological: Pt is alert. Not confused, motor intact Skin: Skin is warm. No erythema.  Psychiatric: Pt behavior is normal.     Assessment & Plan:

## 2012-03-02 LAB — RPR

## 2012-03-02 NOTE — Telephone Encounter (Signed)
No record of results in our system. I called Endo at [facility] to check on the report. Aurther Loft will call back. lmom for pt's on that we are trying to find results and I will keep him informed.

## 2012-03-02 NOTE — Telephone Encounter (Signed)
Late entry   03/02/12   Judeth Cornfield the RN who performs Esophageal Manometry procedures, reports the pt came in for the exam and finally was able to swallow the tube. She was never able to "position" the tube properly and the pt was getting tired so  The procedure was aborted.  I notified the pt's daughter in law and she was to ask pt's son if he wanted the procedure repeated.

## 2012-03-04 ENCOUNTER — Encounter: Payer: Self-pay | Admitting: Internal Medicine

## 2012-03-04 ENCOUNTER — Ambulatory Visit
Admission: RE | Admit: 2012-03-04 | Discharge: 2012-03-04 | Disposition: A | Discharge status: Home / Self Care | Payer: Medicare Other | Source: Ambulatory Visit | Attending: Internal Medicine | Admitting: Internal Medicine

## 2012-03-04 DIAGNOSIS — R410 Disorientation, unspecified: Secondary | ICD-10-CM

## 2012-03-04 NOTE — Telephone Encounter (Signed)
Informed daughter in law, [name] that the technician stated the procedure aborted. She will speak with pt's son to see if he would like to repeat the exam.

## 2012-03-06 ENCOUNTER — Encounter: Payer: Self-pay | Admitting: Internal Medicine

## 2012-03-06 NOTE — Assessment & Plan Note (Signed)
stable overall by hx and exam, most recent data reviewed with pt, and pt to continue medical treatment as before Lab Results  Component Value Date   HGBA1C 6.9* 03/01/2012

## 2012-03-09 ENCOUNTER — Other Ambulatory Visit: Payer: Self-pay | Admitting: Internal Medicine

## 2012-03-09 ENCOUNTER — Other Ambulatory Visit: Payer: Medicare Other

## 2012-03-14 ENCOUNTER — Other Ambulatory Visit: Payer: Self-pay | Admitting: Internal Medicine

## 2012-04-21 ENCOUNTER — Other Ambulatory Visit: Payer: Self-pay | Admitting: Neurology

## 2012-04-21 DIAGNOSIS — R269 Unspecified abnormalities of gait and mobility: Secondary | ICD-10-CM

## 2012-04-21 DIAGNOSIS — E1149 Type 2 diabetes mellitus with other diabetic neurological complication: Secondary | ICD-10-CM

## 2012-04-21 DIAGNOSIS — E119 Type 2 diabetes mellitus without complications: Secondary | ICD-10-CM

## 2012-04-21 DIAGNOSIS — R413 Other amnesia: Secondary | ICD-10-CM

## 2012-05-01 ENCOUNTER — Ambulatory Visit
Admission: RE | Admit: 2012-05-01 | Discharge: 2012-05-01 | Disposition: A | Discharge status: Home / Self Care | Payer: Medicare Other | Source: Ambulatory Visit | Attending: Neurology | Admitting: Neurology

## 2012-05-01 DIAGNOSIS — E1149 Type 2 diabetes mellitus with other diabetic neurological complication: Secondary | ICD-10-CM

## 2012-05-01 DIAGNOSIS — R413 Other amnesia: Secondary | ICD-10-CM

## 2012-05-01 DIAGNOSIS — R269 Unspecified abnormalities of gait and mobility: Secondary | ICD-10-CM

## 2012-05-01 DIAGNOSIS — E119 Type 2 diabetes mellitus without complications: Secondary | ICD-10-CM

## 2012-05-18 ENCOUNTER — Encounter (HOSPITAL_COMMUNITY): Payer: Self-pay | Admitting: Emergency Medicine

## 2012-05-18 ENCOUNTER — Emergency Department (HOSPITAL_COMMUNITY): Payer: Medicare Other

## 2012-05-18 ENCOUNTER — Emergency Department (HOSPITAL_COMMUNITY)
Admission: EM | Admit: 2012-05-18 | Discharge: 2012-05-18 | Disposition: A | Discharge status: Home / Self Care | Payer: Medicare Other | Attending: Emergency Medicine | Admitting: Emergency Medicine

## 2012-05-18 DIAGNOSIS — N390 Urinary tract infection, site not specified: Secondary | ICD-10-CM

## 2012-05-18 DIAGNOSIS — Z833 Family history of diabetes mellitus: Secondary | ICD-10-CM | POA: Insufficient documentation

## 2012-05-18 DIAGNOSIS — K219 Gastro-esophageal reflux disease without esophagitis: Secondary | ICD-10-CM | POA: Insufficient documentation

## 2012-05-18 DIAGNOSIS — E119 Type 2 diabetes mellitus without complications: Secondary | ICD-10-CM | POA: Insufficient documentation

## 2012-05-18 DIAGNOSIS — Z7982 Long term (current) use of aspirin: Secondary | ICD-10-CM | POA: Insufficient documentation

## 2012-05-18 DIAGNOSIS — E876 Hypokalemia: Secondary | ICD-10-CM | POA: Insufficient documentation

## 2012-05-18 DIAGNOSIS — Z794 Long term (current) use of insulin: Secondary | ICD-10-CM | POA: Insufficient documentation

## 2012-05-18 DIAGNOSIS — Z885 Allergy status to narcotic agent status: Secondary | ICD-10-CM | POA: Insufficient documentation

## 2012-05-18 DIAGNOSIS — F3289 Other specified depressive episodes: Secondary | ICD-10-CM | POA: Insufficient documentation

## 2012-05-18 DIAGNOSIS — Z8249 Family history of ischemic heart disease and other diseases of the circulatory system: Secondary | ICD-10-CM | POA: Insufficient documentation

## 2012-05-18 DIAGNOSIS — E669 Obesity, unspecified: Secondary | ICD-10-CM | POA: Insufficient documentation

## 2012-05-18 DIAGNOSIS — Z800 Family history of malignant neoplasm of digestive organs: Secondary | ICD-10-CM | POA: Insufficient documentation

## 2012-05-18 DIAGNOSIS — F329 Major depressive disorder, single episode, unspecified: Secondary | ICD-10-CM | POA: Insufficient documentation

## 2012-05-18 DIAGNOSIS — I10 Essential (primary) hypertension: Secondary | ICD-10-CM | POA: Insufficient documentation

## 2012-05-18 LAB — CBC WITH DIFFERENTIAL/PLATELET
Basophils Absolute: 0 10*3/uL (ref 0.0–0.1)
Eosinophils Relative: 4 % (ref 0–5)
HCT: 32.2 % — ABNORMAL LOW (ref 36.0–46.0)
Lymphocytes Relative: 32 % (ref 12–46)
Lymphs Abs: 2 10*3/uL (ref 0.7–4.0)
MCV: 88.7 fL (ref 78.0–100.0)
Neutro Abs: 3.7 10*3/uL (ref 1.7–7.7)
Platelets: 266 10*3/uL (ref 150–400)
RBC: 3.63 MIL/uL — ABNORMAL LOW (ref 3.87–5.11)
RDW: 12.9 % (ref 11.5–15.5)
WBC: 6.4 10*3/uL (ref 4.0–10.5)

## 2012-05-18 LAB — COMPREHENSIVE METABOLIC PANEL
ALT: 15 U/L (ref 0–35)
AST: 20 U/L (ref 0–37)
Alkaline Phosphatase: 58 U/L (ref 39–117)
CO2: 30 mEq/L (ref 19–32)
Calcium: 10 mg/dL (ref 8.4–10.5)
Chloride: 98 mEq/L (ref 96–112)
GFR calc Af Amer: 67 mL/min — ABNORMAL LOW (ref >90)
GFR calc non Af Amer: 58 mL/min — ABNORMAL LOW (ref >90)
Glucose, Bld: 121 mg/dL — ABNORMAL HIGH (ref 70–99)
Sodium: 137 mEq/L (ref 135–145)
Total Bilirubin: 0.3 mg/dL (ref 0.3–1.2)

## 2012-05-18 LAB — URINALYSIS, ROUTINE W REFLEX MICROSCOPIC
Glucose, UA: NEGATIVE mg/dL
Hgb urine dipstick: NEGATIVE
Specific Gravity, Urine: 1.01 (ref 1.005–1.030)

## 2012-05-18 LAB — URINE MICROSCOPIC-ADD ON

## 2012-05-18 MED ORDER — POTASSIUM CHLORIDE ER 10 MEQ PO TBCR: 20.0000 meq | EXTENDED_RELEASE_TABLET | Freq: Two times a day (BID) | ORAL | Status: DC

## 2012-05-18 MED ORDER — CEPHALEXIN 500 MG PO CAPS: 500.0000 mg | ORAL_CAPSULE | Freq: Four times a day (QID) | ORAL | Status: AC

## 2012-05-18 NOTE — ED Provider Notes (Signed)
History     CSN: [id number]  Arrival date & time 05/18/12  1321   First MD Initiated Contact with Patient 05/18/12 1648      Chief Complaint  Patient presents with  . bruises on legs    . Leg Swelling    (Consider location/radiation/quality/duration/timing/severity/associated sxs/prior treatment) The history is provided by the patient.   patient states that this morning her ankles and lower legs are swollen. She and her family states there were also a brownish color. She is had diabetic neuropathy maximums hurt, however she states it is more painful. She also has some new bruising to left lower leg that she does not know where it came from. She has a few other areas of bruising also. No chest pain. No trouble breathing. She states she has been urinating more frequently. No dysuria. She states she does not know what she has a urinary tract infection. No lightheadedness or dizziness. She states that the legs improved since she's been in ER.  Past Medical History  Diagnosis Date  . COLONIC POLYPS 06/15/2005  . DIABETES MELLITUS, TYPE II 04/22/2007  . HYPERCHOLESTEROLEMIA   . HYPERLIPIDEMIA   . OBESITY   . ANEMIA-IRON DEFICIENCY   . DEPRESSION   . HYPERTENSION   . ALLERGIC RHINITIS   . ESOPHAGEAL STRICTURE   . GERD   . HIATAL HERNIA   . DIVERTICULOSIS, COLON   . BACK PAIN   . OSTEOPOROSIS   . DIZZINESS   . WEIGHT LOSS   . DYSPNEA   . Other dysphagia   . COLONIC POLYPS, HX OF 04/03/2002 & 06/15/2005    TUBULAR ADENOMA    Past Surgical History  Procedure Date  . Appendectomy   . Abdominal hysterectomy   . Tonsillectomy and adenoidectomy   . Left ankle surgury   . S/p bladder pubovaginal sling   . S/p cystocele/rectocele repair   . Laparoscopic takedown of incarcerated stomach within the chest/nissen 2009  . Esophageal manometry 12/21/2011    Procedure: ESOPHAGEAL MANOMETRY (EM);  Surgeon: Mardella Layman, MD;  Location: [facility];  Service: Endoscopy;  Laterality:  N/A;    Family History  Problem Relation Age of Onset  . Heart disease Mother 43  . Diabetes Mother   . Heart attack Mother   . Colon cancer Father   . Diabetes Father     History  Substance Use Topics  . Smoking status: Never Smoker   . Smokeless tobacco: Never Used  . Alcohol Use: No    OB History    Grav Para Term Preterm Abortions TAB SAB Ect Mult Living                  Review of Systems  Constitutional: Negative for activity change and appetite change.  HENT: Negative for neck stiffness.   Eyes: Negative for pain.  Respiratory: Negative for chest tightness and shortness of breath.   Cardiovascular: Negative for chest pain and leg swelling.  Gastrointestinal: Negative for nausea, vomiting, abdominal pain and diarrhea.  Genitourinary: Positive for frequency. Negative for flank pain.  Musculoskeletal: Positive for joint swelling. Negative for back pain.  Skin: Negative for rash.  Neurological: Negative for weakness, numbness and headaches.  Psychiatric/Behavioral: Negative for behavioral problems.    Allergies  Codeine; Hydrocodone-acetaminophen; and Oxycodone-acetaminophen  Home Medications   Current Outpatient Rx  Name Route Sig Dispense Refill  . ASPIRIN 81 MG PO TBEC Oral Take 81 mg by mouth daily.      Marland Kitchen DIPHENHYDRAMINE-APAP (  SLEEP) 25-500 MG PO TABS Oral Take 1 tablet by mouth at bedtime as needed. For pain.    . DONEPEZIL HCL 5 MG PO TABS Oral Take 5 mg by mouth daily.    . INSULIN GLARGINE 100 UNIT/ML [employer] SOLN Subcutaneous Inject 40 Units into the skin daily.    Marland Kitchen METFORMIN HCL 500 MG PO TABS Oral Take 500 mg by mouth 2 (two) times daily with a meal.    . ADULT MULTIVITAMIN W/MINERALS CH Oral Take 1 tablet by mouth daily.    Marland Kitchen NITROGLYCERIN 0.4 MG SL SUBL Sublingual Place 0.4 mg under the tongue every 5 (five) minutes x 3 doses as needed. For chest pain.    . CEPHALEXIN 500 MG PO CAPS Oral Take 1 capsule (500 mg total) by mouth 4 (four) times daily. 20  capsule 0  . LANCETS MISC  Use as instructed once daily 100 each 11  . ONETOUCH ULTRA BLUE VI STRP  USE ONE TEST STRIP  EVERY DAY 100 each 10  . POTASSIUM CHLORIDE ER 10 MEQ PO TBCR Oral Take 2 tablets (20 mEq total) by mouth 2 (two) times daily. 12 tablet 0    BP 184/89  Pulse 72  Temp 98.2 F (36.8 C) (Oral)  Resp 16  SpO2 100%  Physical Exam  Nursing note and vitals reviewed. Constitutional: She is oriented to person, place, and time. She appears well-developed and well-nourished.  HENT:  Head: Normocephalic and atraumatic.  Eyes: EOM are normal. Pupils are equal, round, and reactive to light.  Neck: Normal range of motion. Neck supple.  Cardiovascular: Normal rate, regular rhythm and normal heart sounds.   No murmur heard. Pulmonary/Chest: Effort normal and breath sounds normal. No respiratory distress. She has no wheezes. She has no rales.  Abdominal: Soft. Bowel sounds are normal. She exhibits no distension. There is no tenderness. There is no rebound and no guarding.  Musculoskeletal: Normal range of motion.       eccymosis to left lateral lower leg. Right lower leg is larger than contralateral. Patient states that is chronic since previous ankle surgery.  Neurological: She is alert and oriented to person, place, and time. No cranial nerve deficit.  Skin: Skin is warm and dry.  Psychiatric: She has a normal mood and affect. Her speech is normal.    ED Course  Procedures (including critical care time)  Labs Reviewed  URINALYSIS, ROUTINE W REFLEX MICROSCOPIC - Abnormal; Notable for the following:    APPearance CLOUDY (*)     Protein, ur 30 (*)     Nitrite POSITIVE (*)     Leukocytes, UA LARGE (*)     All other components within normal limits  CBC WITH DIFFERENTIAL - Abnormal; Notable for the following:    RBC 3.63 (*)     Hemoglobin 10.7 (*)     HCT 32.2 (*)     All other components within normal limits  COMPREHENSIVE METABOLIC PANEL - Abnormal; Notable for the  following:    Potassium 3.0 (*)     Glucose, Bld 121 (*)     GFR calc non Af Amer 58 (*)     GFR calc Af Amer 67 (*)     All other components within normal limits  URINE MICROSCOPIC-ADD ON - Abnormal; Notable for the following:    Bacteria, UA MANY (*)     All other components within normal limits  PROTIME-INR  URINE CULTURE   Dg Tibia/fibula Left  05/18/2012  *RADIOLOGY REPORT*  Clinical Data: Leg swelling  LEFT TIBIA AND FIBULA - 2 VIEW  Comparison: None.  Findings: Frontal and lateral views were obtained.  No fracture or dislocation.  No abnormal periosteal reaction.  There are scattered foci of vascular calcification. Joint spaces appear intact.  IMPRESSION: No fracture or dislocation.   Original Report Authenticated By: Arvin Collard. WOODRUFF III, M.D.      1. UTI (urinary tract infection)   2. Hypokalemia       MDM  Patient had reported swelling in her bilateral lower legs. It is resolved. Laboratory reassuring except for urinary tract infection mild hypokalemia. Patient feels well and will be discharged home to followup with her primary care doctor as needed. Urine culture was sent.        Juliet Rude. Rubin Payor, MD 05/18/12 [phone]

## 2012-05-18 NOTE — ED Notes (Signed)
When seen by son this am, ankles were swollen, and new bruising to left lower leg, has hx of diabetic neuropathy

## 2012-05-20 LAB — URINE CULTURE

## 2012-05-21 NOTE — ED Notes (Signed)
+  Urine. Patient treated with Keflex. Sensitive to same. Per protocol MD. °

## 2012-07-14 ENCOUNTER — Telehealth: Payer: Self-pay | Admitting: Internal Medicine

## 2012-07-14 ENCOUNTER — Other Ambulatory Visit (INDEPENDENT_AMBULATORY_CARE_PROVIDER_SITE_OTHER): Payer: Medicare Other

## 2012-07-14 ENCOUNTER — Encounter: Payer: Self-pay | Admitting: Internal Medicine

## 2012-07-14 ENCOUNTER — Ambulatory Visit (INDEPENDENT_AMBULATORY_CARE_PROVIDER_SITE_OTHER): Payer: Medicare Other | Admitting: Internal Medicine

## 2012-07-14 VITALS — BP 150/72 | HR 112 | Temp 96.4°F | Ht <= 58 in | Wt 150.2 lb

## 2012-07-14 DIAGNOSIS — R350 Frequency of micturition: Secondary | ICD-10-CM

## 2012-07-14 DIAGNOSIS — I10 Essential (primary) hypertension: Secondary | ICD-10-CM

## 2012-07-14 DIAGNOSIS — Z23 Encounter for immunization: Secondary | ICD-10-CM

## 2012-07-14 DIAGNOSIS — R110 Nausea: Secondary | ICD-10-CM

## 2012-07-14 LAB — CBC WITH DIFFERENTIAL/PLATELET
Basophils Relative: 0.6 % (ref 0.0–3.0)
Eosinophils Absolute: 0.1 10*3/uL (ref 0.0–0.7)
Eosinophils Relative: 1.8 % (ref 0.0–5.0)
Hemoglobin: 10.4 g/dL — ABNORMAL LOW (ref 12.0–15.0)
Lymphocytes Relative: 45.8 % (ref 12.0–46.0)
MCHC: 33.1 g/dL (ref 30.0–36.0)
MCV: 90.6 fl (ref 78.0–100.0)
Neutro Abs: 3.4 10*3/uL (ref 1.4–7.7)
RBC: 3.48 Mil/uL — ABNORMAL LOW (ref 3.87–5.11)
WBC: 8.1 10*3/uL (ref 4.5–10.5)

## 2012-07-14 LAB — URINALYSIS, ROUTINE W REFLEX MICROSCOPIC
Specific Gravity, Urine: 1.01 (ref 1.000–1.030)
Urobilinogen, UA: 0.2 (ref 0.0–1.0)

## 2012-07-14 LAB — BASIC METABOLIC PANEL
BUN: 24 mg/dL — ABNORMAL HIGH (ref 6–23)
Calcium: 9.3 mg/dL (ref 8.4–10.5)
Chloride: 99 mEq/L (ref 96–112)
Creatinine, Ser: 1 mg/dL (ref 0.4–1.2)
GFR: 56.12 mL/min — ABNORMAL LOW (ref >60.00)

## 2012-07-14 LAB — HEPATIC FUNCTION PANEL
ALT: 16 U/L (ref 0–35)
Albumin: 3.7 g/dL (ref 3.5–5.2)
Alkaline Phosphatase: 53 U/L (ref 39–117)
Total Protein: 7.4 g/dL (ref 6.0–8.3)

## 2012-07-14 MED ORDER — GLUCOSE BLOOD VI STRP: ORAL_STRIP | Status: DC

## 2012-07-14 MED ORDER — PROMETHAZINE HCL 25 MG/ML IJ SOLN
12.5000 mg | Freq: Once | INTRAMUSCULAR | Status: AC
  Administered 2012-07-14: 12.5 mg via INTRAVENOUS

## 2012-07-14 NOTE — Patient Instructions (Addendum)
You had the shot for nausea today (phenergan 12.5 mg) OK to HOLD taking the namenda for 1 week Please go to LAB in the Basement for the blood and/or urine tests to be done today You will be contacted by phone if any changes need to be made immediately.  Otherwise, you will receive a letter about your results with an explanation. Please remember to sign up for My Chart at your earliest convenience, as this will be important to you in the future with finding out test results. Please re-start the namenda again in 1 wk;  If the dizziness and nausea re-occur, you may wish to call Neurology regarding further advice Please return in 6 months, or sooner if needed

## 2012-07-14 NOTE — Telephone Encounter (Signed)
Emergent call Caller: [name] (daughter-In-Law)/; Patient Name: [name]; PCP: Oliver Barre (Adults only); Best Callback Phone Number: [phone]; Per CSR "THE PATIENT REFUSED 911" Caller reports recent diagnosis of Alzheimers, states medications changed by Dr Troy Sine Neurological: Namenda XR, 4 weeks gradually increasing dosages, was told may increase Blood sugar; reports  result of 223 @ 1330 one hour after eating, given insulin: 10 units of Lantus (after 24 untis this morning) Blood sugar now 246. Not with patient. Dizzy and nauseaus.    Appointment advised to have seen within 4 hours, due to symptoms with change in medications. Agrees to appointment 1600 07/14/12 with Dr Melvyn Novas.

## 2012-07-15 ENCOUNTER — Encounter: Payer: Self-pay | Admitting: Internal Medicine

## 2012-07-15 ENCOUNTER — Other Ambulatory Visit: Payer: Self-pay | Admitting: Internal Medicine

## 2012-07-15 LAB — LIPID PANEL
Cholesterol: 265 mg/dL — ABNORMAL HIGH (ref 0–200)
Triglycerides: 143 mg/dL (ref 0.0–149.0)

## 2012-07-15 MED ORDER — CEPHALEXIN 500 MG PO CAPS: 500.0000 mg | ORAL_CAPSULE | Freq: Four times a day (QID) | ORAL | Status: DC

## 2012-07-16 ENCOUNTER — Encounter: Payer: Self-pay | Admitting: Internal Medicine

## 2012-07-16 DIAGNOSIS — IMO0001 Reserved for inherently not codable concepts without codable children: Secondary | ICD-10-CM

## 2012-07-16 DIAGNOSIS — R110 Nausea: Secondary | ICD-10-CM | POA: Insufficient documentation

## 2012-07-16 DIAGNOSIS — E1165 Type 2 diabetes mellitus with hyperglycemia: Secondary | ICD-10-CM | POA: Insufficient documentation

## 2012-07-16 DIAGNOSIS — R350 Frequency of micturition: Secondary | ICD-10-CM | POA: Insufficient documentation

## 2012-07-16 HISTORY — DX: Reserved for inherently not codable concepts without codable children: IMO0001

## 2012-07-16 LAB — URINE CULTURE

## 2012-07-16 NOTE — Assessment & Plan Note (Signed)
I think likely more related to possible UTI but ok to hold namenda for 1 wk, then re-try

## 2012-07-16 NOTE — Assessment & Plan Note (Signed)
With mild elev BS recent, I suspect underlying UTI related, for labs and cont meds as is for now

## 2012-07-16 NOTE — Assessment & Plan Note (Signed)
Mild elev today liekly situational, o/w stable overall by hx and exam, most recent data reviewed with pt, and pt to continue medical treatment as before BP Readings from Last 3 Encounters:  07/14/12 150/72  05/18/12 184/89  03/01/12 138/62

## 2012-07-16 NOTE — Progress Notes (Signed)
Subjective:    Patient ID: [name], female    DOB: 1930-09-24, 78 y.o.   MRN: [id number]  HPI  Here with family concerned about unusually increased Blood sugar in the low 200's  and nausea for the past wk, coincident with start of namenda starter pack per neurology; pt with dementia, hx difficult.  No fever, and  Denies urinary symptoms such as dysuria, urgency,or hematuria but has had mild frequency. Pt denies chest pain, increased sob or doe, wheezing, orthopnea, PND, increased LE swelling, palpitations, dizziness or syncope. Denies worsening reflux, dysphagia, abd pain, vomiting, bowel change or blood.  Has pedal edema in the evening, none during the day.  Has ongoing chronic dizziness.  Due for flu shot today.   Pt denies fever, wt loss, night sweats, loss of appetite, or other constitutional symptoms. Dementia overall stable and not assoc with behavioral changes such as hallucinations, paranoia, or agitation. Past Medical History  Diagnosis Date  . COLONIC POLYPS 06/15/2005  . DIABETES MELLITUS, TYPE II 04/22/2007  . HYPERCHOLESTEROLEMIA   . HYPERLIPIDEMIA   . OBESITY   . ANEMIA-IRON DEFICIENCY   . DEPRESSION   . HYPERTENSION   . ALLERGIC RHINITIS   . ESOPHAGEAL STRICTURE   . GERD   . HIATAL HERNIA   . DIVERTICULOSIS, COLON   . BACK PAIN   . OSTEOPOROSIS   . DIZZINESS   . WEIGHT LOSS   . DYSPNEA   . Other dysphagia   . COLONIC POLYPS, HX OF 04/03/2002 & 06/15/2005    TUBULAR ADENOMA   Past Surgical History  Procedure Date  . Appendectomy   . Abdominal hysterectomy   . Tonsillectomy and adenoidectomy   . Left ankle surgury   . S/p bladder pubovaginal sling   . S/p cystocele/rectocele repair   . Laparoscopic takedown of incarcerated stomach within the chest/nissen 2009  . Esophageal manometry 12/21/2011    Procedure: ESOPHAGEAL MANOMETRY (EM);  Surgeon: Mardella Layman, MD;  Location: [facility];  Service: Endoscopy;  Laterality: N/A;    reports that she has never  smoked. She has never used smokeless tobacco. She reports that she does not drink alcohol or use illicit drugs. family history includes Colon cancer in her father; Diabetes in her father and mother; Heart attack in her mother; and Heart disease (age of onset:70) in her mother. Allergies  Allergen Reactions  . Codeine Nausea And Vomiting and Rash  . Hydrocodone-Acetaminophen Nausea And Vomiting and Rash  . Oxycodone-Acetaminophen Nausea And Vomiting and Rash   Current Outpatient Prescriptions on File Prior to Visit  Medication Sig Dispense Refill  . aspirin 81 MG EC tablet Take 81 mg by mouth daily.        . diphenhydramine-acetaminophen (TYLENOL PM) 25-500 MG TABS Take 1 tablet by mouth at bedtime as needed. For pain.      Marland Kitchen insulin glargine (LANTUS) 100 UNIT/ML injection Inject 40 Units into the skin daily.      . Lancets MISC Use as instructed once daily  100 each  11  . metFORMIN (GLUCOPHAGE) 500 MG tablet Take 500 mg by mouth 2 (two) times daily with a meal.      . Multiple Vitamin (MULTIVITAMIN WITH MINERALS) TABS Take 1 tablet by mouth daily.      . nitroGLYCERIN (NITROSTAT) 0.4 MG SL tablet Place 0.4 mg under the tongue every 5 (five) minutes x 3 doses as needed. For chest pain.      Marland Kitchen donepezil (ARICEPT) 5 MG tablet Take  5 mg by mouth daily.      . potassium chloride (K-DUR) 10 MEQ tablet Take 2 tablets (20 mEq total) by mouth 2 (two) times daily.  12 tablet  0   Review of Systems  Constitutional: Negative for diaphoresis and unexpected weight change.  HENT: Negative for tinnitus.   Eyes: Negative for photophobia and visual disturbance.  Respiratory: Negative for choking and stridor.   Gastrointestinal: Negative for vomiting and blood in stool.  Genitourinary: Negative for hematuria and decreased urine volume.  Musculoskeletal: Negative for gait problem.  Skin: Negative for color change and wound.  Neurological: Negative for tremors and numbness.  Psychiatric/Behavioral:  Negative for decreased concentration. The patient is not hyperactive.       Objective:   Physical Exam BP 150/72  Pulse 112  Temp 96.4 F (35.8 C) (Oral)  Ht 4\' 8"  (1.422 m)  Wt 150 lb 4 oz (68.153 kg)  BMI 33.69 kg/m2  SpO2 95% Physical Exam  VS noted Constitutional: Pt appears thin HENT: Head: Normocephalic.  Right Ear: External ear normal.  Left Ear: External ear normal.  Eyes: Conjunctivae and EOM are normal. Pupils are equal, round, and reactive to light.  Neck: Normal range of motion. Neck supple.  Cardiovascular: Normal rate and regular rhythm.   Pulmonary/Chest: Effort normal and breath sounds normal.  Abd:  Soft, NT, non-distended, + BS Neurological: Pt is alert. Remains confused Skin: Skin is warm. No erythema.  Psychiatric: Pt behavior is normal. Thought content confused.     Assessment & Plan:

## 2012-07-16 NOTE — Assessment & Plan Note (Signed)
Possible UTI - for UA , consider antibx

## 2012-07-18 ENCOUNTER — Encounter: Payer: Self-pay | Admitting: Internal Medicine

## 2012-07-27 ENCOUNTER — Emergency Department (HOSPITAL_COMMUNITY): Payer: Medicare Other

## 2012-07-27 ENCOUNTER — Telehealth: Payer: Self-pay | Admitting: Internal Medicine

## 2012-07-27 ENCOUNTER — Emergency Department (HOSPITAL_COMMUNITY)
Admission: EM | Admit: 2012-07-27 | Discharge: 2012-07-27 | Disposition: A | Discharge status: Home / Self Care | Payer: Medicare Other | Attending: Emergency Medicine | Admitting: Emergency Medicine

## 2012-07-27 ENCOUNTER — Encounter (HOSPITAL_COMMUNITY): Payer: Self-pay | Admitting: Emergency Medicine

## 2012-07-27 DIAGNOSIS — F419 Anxiety disorder, unspecified: Secondary | ICD-10-CM

## 2012-07-27 DIAGNOSIS — E669 Obesity, unspecified: Secondary | ICD-10-CM | POA: Insufficient documentation

## 2012-07-27 DIAGNOSIS — Z8739 Personal history of other diseases of the musculoskeletal system and connective tissue: Secondary | ICD-10-CM | POA: Insufficient documentation

## 2012-07-27 DIAGNOSIS — Z8719 Personal history of other diseases of the digestive system: Secondary | ICD-10-CM | POA: Insufficient documentation

## 2012-07-27 DIAGNOSIS — F411 Generalized anxiety disorder: Secondary | ICD-10-CM | POA: Insufficient documentation

## 2012-07-27 DIAGNOSIS — Z794 Long term (current) use of insulin: Secondary | ICD-10-CM | POA: Insufficient documentation

## 2012-07-27 DIAGNOSIS — Z7982 Long term (current) use of aspirin: Secondary | ICD-10-CM | POA: Insufficient documentation

## 2012-07-27 DIAGNOSIS — Z860100 Personal history of colon polyps, unspecified: Secondary | ICD-10-CM | POA: Insufficient documentation

## 2012-07-27 DIAGNOSIS — I10 Essential (primary) hypertension: Secondary | ICD-10-CM | POA: Insufficient documentation

## 2012-07-27 DIAGNOSIS — E785 Hyperlipidemia, unspecified: Secondary | ICD-10-CM | POA: Insufficient documentation

## 2012-07-27 DIAGNOSIS — E7800 Pure hypercholesterolemia, unspecified: Secondary | ICD-10-CM | POA: Insufficient documentation

## 2012-07-27 DIAGNOSIS — Z8669 Personal history of other diseases of the nervous system and sense organs: Secondary | ICD-10-CM | POA: Insufficient documentation

## 2012-07-27 DIAGNOSIS — Z79899 Other long term (current) drug therapy: Secondary | ICD-10-CM | POA: Insufficient documentation

## 2012-07-27 DIAGNOSIS — Z862 Personal history of diseases of the blood and blood-forming organs and certain disorders involving the immune mechanism: Secondary | ICD-10-CM | POA: Insufficient documentation

## 2012-07-27 DIAGNOSIS — F0390 Unspecified dementia without behavioral disturbance: Secondary | ICD-10-CM | POA: Insufficient documentation

## 2012-07-27 DIAGNOSIS — E119 Type 2 diabetes mellitus without complications: Secondary | ICD-10-CM | POA: Insufficient documentation

## 2012-07-27 LAB — URINALYSIS, ROUTINE W REFLEX MICROSCOPIC
Glucose, UA: 100 mg/dL — AB
Hgb urine dipstick: NEGATIVE
Specific Gravity, Urine: 1.008 (ref 1.005–1.030)
pH: 7.5 (ref 5.0–8.0)

## 2012-07-27 LAB — CBC WITH DIFFERENTIAL/PLATELET
Basophils Relative: 0 % (ref 0–1)
Eosinophils Absolute: 0.2 10*3/uL (ref 0.0–0.7)
Eosinophils Relative: 2 % (ref 0–5)
Hemoglobin: 11.8 g/dL — ABNORMAL LOW (ref 12.0–15.0)
Lymphs Abs: 2.7 10*3/uL (ref 0.7–4.0)
MCH: 29.9 pg (ref 26.0–34.0)
MCHC: 34.1 g/dL (ref 30.0–36.0)
MCV: 87.6 fL (ref 78.0–100.0)
Monocytes Absolute: 0.6 10*3/uL (ref 0.1–1.0)
Neutro Abs: 4.8 10*3/uL (ref 1.7–7.7)
Neutrophils Relative %: 59 % (ref 43–77)
RBC: 3.95 MIL/uL (ref 3.87–5.11)

## 2012-07-27 LAB — URINE MICROSCOPIC-ADD ON

## 2012-07-27 LAB — BASIC METABOLIC PANEL
BUN: 16 mg/dL (ref 6–23)
CO2: 26 mEq/L (ref 19–32)
Calcium: 10.1 mg/dL (ref 8.4–10.5)
Creatinine, Ser: 0.88 mg/dL (ref 0.50–1.10)
GFR calc non Af Amer: 61 mL/min — ABNORMAL LOW (ref >90)
Glucose, Bld: 84 mg/dL (ref 70–99)

## 2012-07-27 LAB — GLUCOSE, CAPILLARY: Glucose-Capillary: 62 mg/dL — ABNORMAL LOW (ref 70–99)

## 2012-07-27 MED ORDER — AMLODIPINE BESYLATE 5 MG PO TABS
5.0000 mg | ORAL_TABLET | Freq: Once | ORAL | Status: AC
  Administered 2012-07-27: 5 mg via ORAL
  Filled 2012-07-27: qty 1

## 2012-07-27 MED ORDER — AMLODIPINE BESYLATE 5 MG PO TABS: 5.0000 mg | ORAL_TABLET | Freq: Every day | ORAL | Status: DC

## 2012-07-27 NOTE — Telephone Encounter (Signed)
Received call from Dr Wonda Olds;  Pt seen for ? Left facial droop/numbness, elev BP - CT head neg, exam essentially ok except for sbp 200 -   Ok for amloidipine 5 mg  Will ask staff to call pt/family to f/u here on Friday Nov 22  Robin to contact pt/family

## 2012-07-27 NOTE — ED Provider Notes (Signed)
History     CSN: [id number]  Arrival date & time 07/27/12  1253   First MD Initiated Contact with Patient 07/27/12 1413      No chief complaint on file.   (Consider location/radiation/quality/duration/timing/severity/associated sxs/prior treatment) HPI Comments: [name] is a 78 y.o. Female who presents by EMS, for evaluation of left facial numbness. On arrival. She is alert and communicative. Code stroke. Was not called. I was called urgently to the room at 14:10- when the patient complained of shortness of breath, and was hypertensive at 249/149. At this point she was alert breathing rapidly, and visibly anxious. His discomfort improved within 5 minutes as I talk to her.  After she fell. Better. She related that she began to have facial numbness sometime this morning, but cannot remember when. She denies having trouble walking. She did feel dizzy. She was going to go out shopping ts morning with a family member.   She denies recent fever, chills, nausea, vomiting, change in bowel or urinary habits.   She is on week 3 of a graduating dose of Namenda  The history is provided by the patient.    Past Medical History  Diagnosis Date  . COLONIC POLYPS 06/15/2005  . DIABETES MELLITUS, TYPE II 04/22/2007  . HYPERCHOLESTEROLEMIA   . HYPERLIPIDEMIA   . OBESITY   . ANEMIA-IRON DEFICIENCY   . DEPRESSION   . HYPERTENSION   . ALLERGIC RHINITIS   . ESOPHAGEAL STRICTURE   . GERD   . HIATAL HERNIA   . DIVERTICULOSIS, COLON   . BACK PAIN   . OSTEOPOROSIS   . DIZZINESS   . WEIGHT LOSS   . DYSPNEA   . Other dysphagia   . COLONIC POLYPS, HX OF 04/03/2002 & 06/15/2005    TUBULAR ADENOMA    Past Surgical History  Procedure Date  . Appendectomy   . Abdominal hysterectomy   . Tonsillectomy and adenoidectomy   . Left ankle surgury   . S/p bladder pubovaginal sling   . S/p cystocele/rectocele repair   . Laparoscopic takedown of incarcerated stomach within the chest/nissen  2009  . Esophageal manometry 12/21/2011    Procedure: ESOPHAGEAL MANOMETRY (EM);  Surgeon: Mardella Layman, MD;  Location: [facility];  Service: Endoscopy;  Laterality: N/A;    Family History  Problem Relation Age of Onset  . Heart disease Mother 63  . Diabetes Mother   . Heart attack Mother   . Colon cancer Father   . Diabetes Father     History  Substance Use Topics  . Smoking status: Never Smoker   . Smokeless tobacco: Never Used  . Alcohol Use: No    OB History    Grav Para Term Preterm Abortions TAB SAB Ect Mult Living                  Review of Systems  All other systems reviewed and are negative.    Allergies  Codeine; Hydrocodone-acetaminophen; and Oxycodone-acetaminophen  Home Medications   Current Outpatient Rx  Name  Route  Sig  Dispense  Refill  . ASPIRIN 81 MG PO TBEC   Oral   Take 81 mg by mouth daily.           . CEPHALEXIN 500 MG PO CAPS   Oral   Take 1 capsule (500 mg total) by mouth 4 (four) times daily.   40 capsule   0   . GLUCOSE BLOOD VI STRP      Use as  directed one per day   250.02   100 each   11   . INSULIN GLARGINE 100 UNIT/ML [place] SOLN   Subcutaneous   Inject 40 Units into the skin daily.         Marland Kitchen LANCETS MISC      Use as instructed once daily   100 each   11   . MEMANTINE HCL 10 MG PO TABS   Oral   Take 10 mg by mouth 2 (two) times daily.         Marland Kitchen METFORMIN HCL 500 MG PO TABS   Oral   Take 500 mg by mouth 2 (two) times daily with a meal.         . ADULT MULTIVITAMIN W/MINERALS CH   Oral   Take 1 tablet by mouth daily.         Marland Kitchen NITROGLYCERIN 0.4 MG SL SUBL   Sublingual   Place 0.4 mg under the tongue every 5 (five) minutes x 3 doses as needed. For chest pain.         Marland Kitchen AMLODIPINE BESYLATE 5 MG PO TABS   Oral   Take 1 tablet (5 mg total) by mouth daily.   30 tablet   0     BP 171/70  Pulse 82  Resp 21  SpO2 100%  Physical Exam  Nursing note and vitals reviewed. Constitutional:  She is oriented to person, place, and time. She appears well-developed and well-nourished.  HENT:  Head: Normocephalic and atraumatic.  Eyes: Conjunctivae normal and EOM are normal. Pupils are equal, round, and reactive to light.  Neck: Normal range of motion and phonation normal. Neck supple.  Cardiovascular: Normal rate, regular rhythm and intact distal pulses.   Pulmonary/Chest: Effort normal and breath sounds normal. She exhibits no tenderness.  Abdominal: Soft. She exhibits no distension. There is no tenderness. There is no guarding.  Musculoskeletal: Normal range of motion.  Neurological: She is alert and oriented to person, place, and time. She has normal strength. No cranial nerve deficit. She exhibits normal muscle tone. Coordination normal.       On palpation, there is no difference in light touch, sensation from the left to the right side of the face.  Skin: Skin is warm and dry.  Psychiatric: Her behavior is normal.       Anxious    ED Course  Procedures (including critical care time)   Reevaluation: 16:05: She is alert, eating, and communicative. She does not appear anxious. Blood pressure-  Now 219/101. Record review; BP 184/89, 05/18/12; 152/70, 07/14/12.  Recheck BP by me, 16:20- 200/89  Call to her PCP, to arrange followup care;  Dr. Jonny Ruiz is comfortable with the initiation of Norvasc, and followup with him in the office in 2 days. Family informed of this plan and agree that it makes sense.    Date: 06/24/2012  Rate: 86  Rhythm: normal sinus rhythm  QRS Axis: normal  PR and QT Intervals: normal  ST/T Wave abnormalities: nonspecific ST abnormality  PR and QRS Conduction Disutrbances:left anterior fascicular block  Narrative Interpretation:   Old EKG Reviewed: unchanged       Labs Reviewed  URINALYSIS, ROUTINE W REFLEX MICROSCOPIC - Abnormal; Notable for the following:    Glucose, UA 100 (*)     Protein, ur 30 (*)     Leukocytes, UA MODERATE (*)     All  other components within normal limits  CBC WITH DIFFERENTIAL - Abnormal; Notable for  the following:    Hemoglobin 11.8 (*)     HCT 34.6 (*)     All other components within normal limits  BASIC METABOLIC PANEL - Abnormal; Notable for the following:    Potassium 3.3 (*)     GFR calc non Af Amer 61 (*)     GFR calc Af Amer 71 (*)     All other components within normal limits  GLUCOSE, CAPILLARY - Abnormal; Notable for the following:    Glucose-Capillary 62 (*)     All other components within normal limits  URINE MICROSCOPIC-ADD ON  URINE CULTURE   Dg Chest 2 View  07/27/2012  *RADIOLOGY REPORT*  Clinical Data: Shortness of breath and wheezing.  CHEST - 2 VIEW  Comparison: 10/23/2011  Findings: The lungs are clear without focal infiltrate, edema, pneumothorax or pleural effusion. Interstitial markings are diffusely coarsened with chronic features. Cardiopericardial silhouette is at upper limits of normal for size.  Stable deformity of the left humeral head, consistent with previous trauma. Telemetry leads overlie the chest.  IMPRESSION: Stable.  No acute cardiopulmonary process.   Original Report Authenticated By: Kennith Center, M.D.    Ct Head Wo Contrast  07/27/2012  *RADIOLOGY REPORT*  Clinical Data: Dizziness.  Left facial numbness  CT HEAD WITHOUT CONTRAST  Technique:  Contiguous axial images were obtained from the base of the skull through the vertex without contrast.  Comparison: CT 05/01/2009.  MRI 03/04/2012  Findings: Generalized atrophy with prominence of the subarachnoid space and ventricles.  Mild chronic microvascular ischemia in the cerebral white matter.  Chronic infarct right thalamus and central pons.  No acute infarct, hemorrhage, or mass lesion.  Calvarium is intact.  IMPRESSION: Atrophy and chronic ischemic change.  No acute abnormality.   Original Report Authenticated By: Janeece Riggers, M.D.      1. Hypertension   2. Anxiety   3. Dementia       MDM  Anxiety with  tachypnea and tachycardia. She also has persistent, hypertension. Doubt hypertensive crisis. Doubt TIA, CVA, significant metabolic disorder or serious bacterial infection.    Plan: Home Medications- usual plus Norvasc; Home Treatments- rest; Recommended follow up- PCP in 2 days, office will schedulr the appt.        Flint Melter, MD 07/27/12 [id number]

## 2012-07-27 NOTE — ED Notes (Signed)
Per EMS pt states that around 0800 today she started having left sided numbness around her lips that is localized to that area. Grips strong and no other neuro deficits observed. Pt was diagnosed with dementia two months ago.

## 2012-07-28 LAB — URINE CULTURE

## 2012-07-28 NOTE — Telephone Encounter (Signed)
Called informed the patient of MD instructions.  Called her daughter in law Kyndle Schlender [phone] and she scheduled the appointment for Friday 07/29/12.

## 2012-07-29 ENCOUNTER — Encounter: Payer: Self-pay | Admitting: Internal Medicine

## 2012-07-29 ENCOUNTER — Ambulatory Visit (INDEPENDENT_AMBULATORY_CARE_PROVIDER_SITE_OTHER): Payer: Medicare Other | Admitting: Internal Medicine

## 2012-07-29 VITALS — BP 172/80 | HR 118 | Temp 97.9°F | Ht <= 58 in | Wt 150.0 lb

## 2012-07-29 DIAGNOSIS — G459 Transient cerebral ischemic attack, unspecified: Secondary | ICD-10-CM

## 2012-07-29 DIAGNOSIS — E119 Type 2 diabetes mellitus without complications: Secondary | ICD-10-CM

## 2012-07-29 DIAGNOSIS — I10 Essential (primary) hypertension: Secondary | ICD-10-CM

## 2012-07-29 DIAGNOSIS — E785 Hyperlipidemia, unspecified: Secondary | ICD-10-CM

## 2012-07-29 DIAGNOSIS — Z8673 Personal history of transient ischemic attack (TIA), and cerebral infarction without residual deficits: Secondary | ICD-10-CM | POA: Insufficient documentation

## 2012-07-29 HISTORY — DX: Personal history of transient ischemic attack (TIA), and cerebral infarction without residual deficits: Z86.73

## 2012-07-29 MED ORDER — AMLODIPINE BESYLATE 10 MG PO TABS: 10.0000 mg | ORAL_TABLET | Freq: Every day | ORAL | Status: DC

## 2012-07-29 MED ORDER — POTASSIUM CHLORIDE ER 10 MEQ PO TBCR: 10.0000 meq | EXTENDED_RELEASE_TABLET | Freq: Every day | ORAL | Status: DC

## 2012-07-29 NOTE — Assessment & Plan Note (Signed)
stable overall by hx and exam, most recent data reviewed with pt, and pt to continue medical treatment as before Lab Results  Component Value Date   HGBA1C 7.7* 07/14/2012   Lab Results  Component Value Date   LDLCALC  Value: 96        Total Cholesterol/HDL:CHD Risk Coronary Heart Disease Risk Table                     Men   Women  1/2 Average Risk   3.4   3.3  Average Risk       5.0   4.4  2 X Average Risk   9.6   7.1  3 X Average Risk  23.4   11.0        Use the calculated Patient Ratio above and the CHD Risk Table to determine the patient's CHD Risk.        ATP III CLASSIFICATION (LDL):  <100     mg/dL   Optimal  161-096  mg/dL   Near or Above                    Optimal  130-159  mg/dL   Borderline  045-409  mg/dL   High  >811     mg/dL   Very High 05/21/7828   Consider statin but pt declines at this time with hx of cva

## 2012-07-29 NOTE — Progress Notes (Signed)
Subjective:    Patient ID: [name], female    DOB: Mar 17, 1931, 78 y.o.   MRN: [id number]  HPI  Here to f/u, overall still c/o persistent left sided facial numbness after initially with ? Of drooping corner of mouth and right arm weakness when seen in ER nov 20;  No overt facial or extremity weakness today., CT head in ER neg for acute, though has evidence of old CVA small to right thalamic and pons. Pt without slurred speech or balance problem, but with ? Worsening right eye vision worsening as well.  Pt denies chest pain, increased sob or doe, wheezing, orthopnea, PND, increased LE swelling, palpitations, dizziness or syncope.  Pt denies new neurological symptoms such as new headache, or facial or extremity weakness or numbness except for the above.   Pt denies polydipsia, polyuria. Past Medical History  Diagnosis Date  . COLONIC POLYPS 06/15/2005  . DIABETES MELLITUS, TYPE II 04/22/2007  . HYPERCHOLESTEROLEMIA   . HYPERLIPIDEMIA   . OBESITY   . ANEMIA-IRON DEFICIENCY   . DEPRESSION   . HYPERTENSION   . ALLERGIC RHINITIS   . ESOPHAGEAL STRICTURE   . GERD   . HIATAL HERNIA   . DIVERTICULOSIS, COLON   . BACK PAIN   . OSTEOPOROSIS   . DIZZINESS   . WEIGHT LOSS   . DYSPNEA   . Other dysphagia   . COLONIC POLYPS, HX OF 04/03/2002 & 06/15/2005    TUBULAR ADENOMA  . History of CVA (cerebrovascular accident) 07/29/2012    Noted old, to right thalamus and pons by Head CT Jul 27, 2012   Past Surgical History  Procedure Date  . Appendectomy   . Abdominal hysterectomy   . Tonsillectomy and adenoidectomy   . Left ankle surgury   . S/p bladder pubovaginal sling   . S/p cystocele/rectocele repair   . Laparoscopic takedown of incarcerated stomach within the chest/nissen 2009  . Esophageal manometry 12/21/2011    Procedure: ESOPHAGEAL MANOMETRY (EM);  Surgeon: Mardella Layman, MD;  Location: [facility];  Service: Endoscopy;  Laterality: N/A;    reports that she has never smoked.  She has never used smokeless tobacco. She reports that she does not drink alcohol or use illicit drugs. family history includes Colon cancer in her father; Diabetes in her father and mother; Heart attack in her mother; and Heart disease (age of onset:70) in her mother. Allergies  Allergen Reactions  . Codeine Nausea And Vomiting and Rash  . Hydrocodone-Acetaminophen Nausea And Vomiting and Rash  . Oxycodone-Acetaminophen Nausea And Vomiting and Rash   Current Outpatient Prescriptions on File Prior to Visit  Medication Sig Dispense Refill  . aspirin 81 MG EC tablet Take 81 mg by mouth daily.        . cephALEXin (KEFLEX) 500 MG capsule Take 1 capsule (500 mg total) by mouth 4 (four) times daily.  40 capsule  0  . glucose blood (ONE TOUCH ULTRA TEST) test strip Use as directed one per day   250.02  100 each  11  . insulin glargine (LANTUS) 100 UNIT/ML injection Inject 40 Units into the skin daily.      . Lancets MISC Use as instructed once daily  100 each  11  . memantine (NAMENDA) 10 MG tablet Take 10 mg by mouth 2 (two) times daily.      . metFORMIN (GLUCOPHAGE) 500 MG tablet Take 500 mg by mouth 2 (two) times daily with a meal.      .  Multiple Vitamin (MULTIVITAMIN WITH MINERALS) TABS Take 1 tablet by mouth daily.      . nitroGLYCERIN (NITROSTAT) 0.4 MG SL tablet Place 0.4 mg under the tongue every 5 (five) minutes x 3 doses as needed. For chest pain.       Review of Systems  Constitutional: Negative for diaphoresis and unexpected weight change.  HENT: Negative for tinnitus.   Eyes: Negative for photophobia   Respiratory: Negative for choking and stridor.   Gastrointestinal: Negative for vomiting and blood in stool.  Genitourinary: Negative for hematuria and decreased urine volume.  Musculoskeletal: Negative for worsening gait problem.  Skin: Negative for color change and wound.  Neurological: Negative for tremors.  Psychiatric/Behavioral: Negative for decreased concentration. The  patient is not hyperactive.      Objective:   Physical Exam BP 172/80  Pulse 118  Temp 97.9 F (36.6 C) (Oral)  Ht 4\' 8"  (1.422 m)  Wt 150 lb (68.04 kg)  BMI 33.63 kg/m2  SpO2 99% Physical Exam  VS noted Constitutional: Pt appears well-developed and well-nourished.  HENT: Head: Normocephalic.  Right Ear: External ear normal.  Left Ear: External ear normal.  Eyes: Conjunctivae and EOM are normal. Pupils are equal, round, and reactive to light.  Neck: Normal range of motion. Neck supple.  Cardiovascular: Normal rate and regular rhythm.   Pulmonary/Chest: Effort normal and breath sounds normal.  Abd:  Soft, NT, non-distended, + BS Neurological: Pt is alert. Cn 2-12 intact, motor/sens/dtr intact, gait somewhat unsteady but does not seem worse Skin: Skin is warm. No erythema.  Psychiatric: Pt behavior is normal. Thought content c/w mod to severe dementia, not formally measured    Assessment & Plan:

## 2012-07-29 NOTE — Patient Instructions (Addendum)
Take all new medications as prescribed - the potassium pill  - 1 per day OK to increase the amlodipine to 10 mg per day Continue all other medications as before, including the Aspirin at 81 mg per day for now You will be contacted regarding the referral for: MRI for the brain (to see [facility] now to see about arranging) Please have the pharmacy call with any other refills you may need. No further blood work needed today Thank you for enrolling in MyChart. Please follow the instructions below to securely access your online medical record. MyChart allows you to send messages to your doctor, view your test results, renew your prescriptions, schedule appointments, and more. To Log into MyChart, please go to [email or web address], and your Username is: [email or web address] Please return in 4 months, or sooner if needed

## 2012-07-29 NOTE — Assessment & Plan Note (Signed)
stable overall by hx and exam, most recent data reviewed with pt, and pt to continue medical treatment as before Lab Results  Component Value Date   [facility]  Value: 96        Total Cholesterol/HDL:CHD Risk Coronary Heart Disease Risk Table                     Men   Women  1/2 Average Risk   3.4   3.3  Average Risk       5.0   4.4  2 X Average Risk   9.6   7.1  3 X Average Risk  23.4   11.0        Use the calculated Patient Ratio above and the CHD Risk Table to determine the patient's CHD Risk.        ATP III CLASSIFICATION (LDL):  <100     mg/dL   Optimal  119-147  mg/dL   Near or Above                    Optimal  130-159  mg/dL   Borderline  829-562  mg/dL   High  >130     mg/dL   Very High 8/65/7846   Lab Results  Component Value Date   HGBA1C 7.7* 07/14/2012

## 2012-07-29 NOTE — Assessment & Plan Note (Addendum)
TIA vs prob lacunar CVA - Possible with her recent symptoms, persistent left facial tingling/numbness, and hx of prior cva - for MRI brain, consider f/u with neuro/Dr Terrace Arabia, cont asa 81 mg for now, does not appear to need PT or OT at this time  Note:  Total time for pt hx, exam, review of record with pt in the room, determination of diagnoses and plan for further eval and tx is > 40 min, with over 50% spent in coordination and counseling of patient and daughter

## 2012-07-29 NOTE — Assessment & Plan Note (Signed)
Also elevated at ER visit, now on new amlodipine 5 mg, BP improved but sbp still 172- ok to increase to 10 mg

## 2012-08-02 ENCOUNTER — Ambulatory Visit
Admission: RE | Admit: 2012-08-02 | Discharge: 2012-08-02 | Disposition: A | Discharge status: Home / Self Care | Payer: Medicare Other | Source: Ambulatory Visit | Attending: Internal Medicine | Admitting: Internal Medicine

## 2012-08-02 DIAGNOSIS — Z8673 Personal history of transient ischemic attack (TIA), and cerebral infarction without residual deficits: Secondary | ICD-10-CM

## 2012-08-02 DIAGNOSIS — G459 Transient cerebral ischemic attack, unspecified: Secondary | ICD-10-CM

## 2012-08-10 ENCOUNTER — Telehealth: Payer: Self-pay

## 2012-08-10 NOTE — Telephone Encounter (Signed)
Pt's daughter in law called requesting results of MRI

## 2012-08-10 NOTE — Telephone Encounter (Signed)
Results are on Mychart - can she look there?

## 2012-08-10 NOTE — Telephone Encounter (Signed)
Patients daughter in law informed to go to Mychart.

## 2012-09-08 ENCOUNTER — Ambulatory Visit: Payer: Medicare Other | Admitting: Internal Medicine

## 2012-09-16 ENCOUNTER — Encounter: Payer: Self-pay | Admitting: Internal Medicine

## 2012-09-16 ENCOUNTER — Ambulatory Visit (INDEPENDENT_AMBULATORY_CARE_PROVIDER_SITE_OTHER): Payer: Medicare Other | Admitting: Internal Medicine

## 2012-09-16 VITALS — BP 132/62 | HR 108 | Temp 96.8°F | Wt 147.0 lb

## 2012-09-16 DIAGNOSIS — F0390 Unspecified dementia without behavioral disturbance: Secondary | ICD-10-CM

## 2012-09-16 DIAGNOSIS — I10 Essential (primary) hypertension: Secondary | ICD-10-CM

## 2012-09-16 NOTE — Patient Instructions (Addendum)
Continue all other medications as before No changes today No need for further blood work today Please continue your efforts at being more active, low cholesterol diet, and weight control. Please keep your appointments with your specialists as you have planned Thank you for enrolling in MyChart. Please follow the instructions below to securely access your online medical record. MyChart allows you to send messages to your doctor, view your test results, renew your prescriptions, schedule appointments, and more Please return in 6 months, or sooner if needed

## 2012-09-16 NOTE — Progress Notes (Signed)
Subjective:    Patient ID: [name], female    DOB: 1930/12/21, 79 y.o.   MRN: [id number]  HPI  Here to f/u with daughter who remains supportive, with pt still able to live alone, and paranoina improved on current meds.  Dementia overall stable symptomatically with gradual worsening at best, and not assoc with behavioral changes such as hallucinations, paranoia, or agitation.   Pt denies polydipsia, polyuria,   Pt states overall good compliance with meds, trying to follow lower cholesterol diet, wt overall stable but little exercise however.   Pt denies chest pain, increased sob or doe, wheezing, orthopnea, PND, increased LE swelling, palpitations, dizziness or syncope. Pt denies new neurological symptoms such as new headache, or facial or extremity weakness or numbness  Pt denies fever, wt loss, night sweats, loss of appetite, or other constitutional symptoms  Denies worsening depressive symptoms, suicidal ideation, or panic; has ongoing anxiety, not increased recently.  Past Medical History  Diagnosis Date  . COLONIC POLYPS 06/15/2005  . DIABETES MELLITUS, TYPE II 04/22/2007  . HYPERCHOLESTEROLEMIA   . HYPERLIPIDEMIA   . OBESITY   . ANEMIA-IRON DEFICIENCY   . DEPRESSION   . HYPERTENSION   . ALLERGIC RHINITIS   . ESOPHAGEAL STRICTURE   . GERD   . HIATAL HERNIA   . DIVERTICULOSIS, COLON   . BACK PAIN   . OSTEOPOROSIS   . DIZZINESS   . WEIGHT LOSS   . DYSPNEA   . Other dysphagia   . COLONIC POLYPS, HX OF 04/03/2002 & 06/15/2005    TUBULAR ADENOMA  . History of CVA (cerebrovascular accident) 07/29/2012    Noted old, to right thalamus and pons by Head CT Jul 27, 2012   Past Surgical History  Procedure Date  . Appendectomy   . Abdominal hysterectomy   . Tonsillectomy and adenoidectomy   . Left ankle surgury   . S/p bladder pubovaginal sling   . S/p cystocele/rectocele repair   . Laparoscopic takedown of incarcerated stomach within the chest/nissen 2009  . Esophageal manometry  12/21/2011    Procedure: ESOPHAGEAL MANOMETRY (EM);  Surgeon: Mardella Layman, MD;  Location: [facility];  Service: Endoscopy;  Laterality: N/A;    reports that she has never smoked. She has never used smokeless tobacco. She reports that she does not drink alcohol or use illicit drugs. family history includes Colon cancer in her father; Diabetes in her father and mother; Heart attack in her mother; and Heart disease (age of onset:70) in her mother. Allergies  Allergen Reactions  . Codeine Nausea And Vomiting and Rash  . Hydrocodone-Acetaminophen Nausea And Vomiting and Rash  . Oxycodone-Acetaminophen Nausea And Vomiting and Rash   Current Outpatient Prescriptions on File Prior to Visit  Medication Sig Dispense Refill  . amLODipine (NORVASC) 10 MG tablet Take 1 tablet (10 mg total) by mouth daily.  90 tablet  3  . aspirin 81 MG EC tablet Take 81 mg by mouth daily.        . cephALEXin (KEFLEX) 500 MG capsule Take 1 capsule (500 mg total) by mouth 4 (four) times daily.  40 capsule  0  . glucose blood (ONE TOUCH ULTRA TEST) test strip Use as directed one per day   250.02  100 each  11  . insulin glargine (LANTUS) 100 UNIT/ML injection Inject 40 Units into the skin daily.      . Lancets MISC Use as instructed once daily  100 each  11  . memantine (NAMENDA) 10  MG tablet Take 10 mg by mouth 2 (two) times daily.      . metFORMIN (GLUCOPHAGE) 500 MG tablet Take 500 mg by mouth 2 (two) times daily with a meal.      . Multiple Vitamin (MULTIVITAMIN WITH MINERALS) TABS Take 1 tablet by mouth daily.      . nitroGLYCERIN (NITROSTAT) 0.4 MG SL tablet Place 0.4 mg under the tongue every 5 (five) minutes x 3 doses as needed. For chest pain.      . potassium chloride (K-DUR) 10 MEQ tablet Take 1 tablet (10 mEq total) by mouth daily.  90 tablet  3   Review of Systems  Constitutional: Negative for unexpected weight change, or unusual diaphoresis  HENT: Negative for tinnitus.   Eyes: Negative for  photophobia and visual disturbance.  Respiratory: Negative for choking and stridor.   Gastrointestinal: Negative for vomiting and blood in stool.  Genitourinary: Negative for hematuria and decreased urine volume.  Musculoskeletal: Negative for acute joint swelling Skin: Negative for color change and wound.  Neurological: Negative for tremors and numbness other than noted  Psychiatric/Behavioral: Negative for decreased concentration or  hyperactivity.       Objective:   Physical Exam BP 132/62  Pulse 108  Temp 96.8 F (36 C) (Oral)  Wt 147 lb (66.679 kg)  SpO2 97% Physical Exam  VS noted,  Constitutional: Pt appears well-developed and well-nourished.  HENT: Head: NCAT.  Right Ear: External ear normal.  Left Ear: External ear normal.  Eyes: Conjunctivae and EOM are normal. Pupils are equal, round, and reactive to light.  Neck: Normal range of motion. Neck supple.  Cardiovascular: Normal rate and regular rhythm.   Pulmonary/Chest: Effort normal and breath sounds normal.  Abd:  Soft, NT, non-distended, + BS Neurological: Pt is alert. Has baseline confused  Skin: Skin is warm. No erythema.  Psychiatric: Pt behavior is normal. Has 1+ right ankle swelling chronic post surgury no change.  LLE no edema      Assessment & Plan:

## 2012-09-17 ENCOUNTER — Encounter: Payer: Self-pay | Admitting: Internal Medicine

## 2012-09-17 DIAGNOSIS — F0390 Unspecified dementia without behavioral disturbance: Secondary | ICD-10-CM | POA: Insufficient documentation

## 2012-09-17 HISTORY — DX: Unspecified dementia, unspecified severity, without behavioral disturbance, psychotic disturbance, mood disturbance, and anxiety: F03.90

## 2012-09-17 NOTE — Assessment & Plan Note (Signed)
stable overall by history and exam, recent data reviewed with pt, and pt to continue medical treatment as before,  to f/u any worsening symptoms or concerns Lab Results  Component Value Date   HGBA1C 7.7* 07/14/2012

## 2012-09-17 NOTE — Assessment & Plan Note (Addendum)
stable overall by history and exam, recent data reviewed with pt, and pt to continue medical treatment as before,  to f/u any worsening symptoms or concerns Lab Results  Component Value Date   HGBA1C 7.7* 07/14/2012   BP Readings from Last 3 Encounters:  09/16/12 132/62  07/29/12 172/80  07/27/12 190/72

## 2012-09-17 NOTE — Assessment & Plan Note (Signed)
stable overall by history and exam, and pt to continue medical treatment as before,  to f/u any worsening symptoms or concerns 

## 2013-03-17 ENCOUNTER — Other Ambulatory Visit: Payer: Self-pay | Admitting: Internal Medicine

## 2013-03-20 ENCOUNTER — Telehealth: Payer: Self-pay | Admitting: Neurology

## 2013-03-20 MED ORDER — MEMANTINE HCL ER 28 MG PO CP24: 28.0000 mg | ORAL_CAPSULE | Freq: Every day | ORAL | Status: DC

## 2013-03-20 NOTE — Telephone Encounter (Signed)
We have not received anything from the pharmacy.  Perhaps they are faxing the wrong office?  Rx has been sent.

## 2013-04-06 ENCOUNTER — Ambulatory Visit (INDEPENDENT_AMBULATORY_CARE_PROVIDER_SITE_OTHER): Payer: Medicare Other | Admitting: Internal Medicine

## 2013-04-06 ENCOUNTER — Other Ambulatory Visit: Payer: Self-pay | Admitting: Internal Medicine

## 2013-04-06 ENCOUNTER — Ambulatory Visit (INDEPENDENT_AMBULATORY_CARE_PROVIDER_SITE_OTHER)
Admission: RE | Admit: 2013-04-06 | Discharge: 2013-04-06 | Disposition: A | Discharge status: Home / Self Care | Payer: Medicare Other | Source: Ambulatory Visit | Attending: Internal Medicine | Admitting: Internal Medicine

## 2013-04-06 ENCOUNTER — Other Ambulatory Visit (INDEPENDENT_AMBULATORY_CARE_PROVIDER_SITE_OTHER): Payer: Medicare Other

## 2013-04-06 ENCOUNTER — Encounter: Payer: Self-pay | Admitting: Internal Medicine

## 2013-04-06 VITALS — BP 150/90 | HR 97 | Temp 97.7°F | Ht 63.0 in | Wt 144.2 lb

## 2013-04-06 DIAGNOSIS — IMO0001 Reserved for inherently not codable concepts without codable children: Secondary | ICD-10-CM

## 2013-04-06 DIAGNOSIS — M8580 Other specified disorders of bone density and structure, unspecified site: Secondary | ICD-10-CM

## 2013-04-06 DIAGNOSIS — F329 Major depressive disorder, single episode, unspecified: Secondary | ICD-10-CM

## 2013-04-06 DIAGNOSIS — E785 Hyperlipidemia, unspecified: Secondary | ICD-10-CM

## 2013-04-06 DIAGNOSIS — M899 Disorder of bone, unspecified: Secondary | ICD-10-CM

## 2013-04-06 DIAGNOSIS — I10 Essential (primary) hypertension: Secondary | ICD-10-CM

## 2013-04-06 DIAGNOSIS — F3289 Other specified depressive episodes: Secondary | ICD-10-CM

## 2013-04-06 LAB — BASIC METABOLIC PANEL
CO2: 29 mEq/L (ref 19–32)
Calcium: 9.6 mg/dL (ref 8.4–10.5)
Glucose, Bld: 110 mg/dL — ABNORMAL HIGH (ref 70–99)
Potassium: 3.4 mEq/L — ABNORMAL LOW (ref 3.5–5.1)
Sodium: 138 mEq/L (ref 135–145)

## 2013-04-06 LAB — CBC WITH DIFFERENTIAL/PLATELET
Basophils Absolute: 0.1 10*3/uL (ref 0.0–0.1)
Eosinophils Absolute: 0.1 10*3/uL (ref 0.0–0.7)
HCT: 32.5 % — ABNORMAL LOW (ref 36.0–46.0)
Hemoglobin: 11 g/dL — ABNORMAL LOW (ref 12.0–15.0)
Lymphocytes Relative: 25.6 % (ref 12.0–46.0)
Lymphs Abs: 2.1 10*3/uL (ref 0.7–4.0)
MCHC: 33.7 g/dL (ref 30.0–36.0)
Neutro Abs: 5.3 10*3/uL (ref 1.4–7.7)
Platelets: 309 10*3/uL (ref 150.0–400.0)
RDW: 13.8 % (ref 11.5–14.6)

## 2013-04-06 LAB — TSH: TSH: 2.04 u[IU]/mL (ref 0.35–5.50)

## 2013-04-06 LAB — URINALYSIS, ROUTINE W REFLEX MICROSCOPIC
Bilirubin Urine: NEGATIVE
Ketones, ur: NEGATIVE
Urine Glucose: NEGATIVE
Urobilinogen, UA: 0.2 (ref 0.0–1.0)

## 2013-04-06 LAB — LIPID PANEL
Total CHOL/HDL Ratio: 4
Triglycerides: 90 mg/dL (ref 0.0–149.0)

## 2013-04-06 LAB — HEPATIC FUNCTION PANEL
AST: 15 U/L (ref 0–37)
Alkaline Phosphatase: 64 U/L (ref 39–117)
Bilirubin, Direct: 0.1 mg/dL (ref 0.0–0.3)
Total Protein: 7.6 g/dL (ref 6.0–8.3)

## 2013-04-06 LAB — LDL CHOLESTEROL, DIRECT: Direct LDL: 158 mg/dL

## 2013-04-06 LAB — HEMOGLOBIN A1C: Hgb A1c MFr Bld: 7.4 % — ABNORMAL HIGH (ref 4.6–6.5)

## 2013-04-06 LAB — MICROALBUMIN / CREATININE URINE RATIO
Creatinine,U: 50.4 mg/dL
Microalb, Ur: 24.1 mg/dL — ABNORMAL HIGH (ref 0.0–1.9)

## 2013-04-06 MED ORDER — AMLODIPINE BESYLATE 10 MG PO TABS: 10.0000 mg | ORAL_TABLET | Freq: Every day | ORAL | Status: DC

## 2013-04-06 MED ORDER — POTASSIUM CHLORIDE ER 10 MEQ PO TBCR: 10.0000 meq | EXTENDED_RELEASE_TABLET | Freq: Every day | ORAL | Status: DC

## 2013-04-06 MED ORDER — METFORMIN HCL 500 MG PO TABS: 500.0000 mg | ORAL_TABLET | Freq: Two times a day (BID) | ORAL | Status: DC

## 2013-04-06 MED ORDER — LOSARTAN POTASSIUM 50 MG PO TABS: 50.0000 mg | ORAL_TABLET | Freq: Every day | ORAL | Status: DC

## 2013-04-06 MED ORDER — INSULIN GLARGINE 100 UNIT/ML ~~LOC~~ SOLN: 40.0000 [IU] | Freq: Every day | SUBCUTANEOUS | Status: DC

## 2013-04-06 MED ORDER — ATORVASTATIN CALCIUM 10 MG PO TABS: 10.0000 mg | ORAL_TABLET | Freq: Every day | ORAL | Status: DC

## 2013-04-06 MED ORDER — CEPHALEXIN 500 MG PO CAPS: 500.0000 mg | ORAL_CAPSULE | Freq: Four times a day (QID) | ORAL | Status: DC

## 2013-04-06 NOTE — Assessment & Plan Note (Signed)
Mild uncontrolled, to add losartan 50 mg per day

## 2013-04-06 NOTE — Progress Notes (Signed)
Subjective:    Patient ID: [name], female    DOB: 1931/07/05, 79 y.o.   MRN: [id number]  HPI  Here to f/u with family; overall doing ok,  Pt denies chest pain, increased sob or doe, wheezing, orthopnea, PND, increased LE swelling, palpitations, dizziness or syncope.  Pt denies polydipsia, polyuria, or low sugar symptoms such as weakness or confusion improved with po intake.  Pt denies new neurological symptoms such as new headache, or facial or extremity weakness or numbness.   Pt states overall good compliance with meds, has been trying to follow lower cholesterol diet, with wt overall stable,  but little exercise however.  Denies worsening depressive symptoms, suicidal ideation, or panic; has ongoing anxiety, not increased recently.  Past Medical History  Diagnosis Date  . COLONIC POLYPS 06/15/2005  . DIABETES MELLITUS, TYPE II 04/22/2007  . HYPERCHOLESTEROLEMIA   . HYPERLIPIDEMIA   . OBESITY   . ANEMIA-IRON DEFICIENCY   . DEPRESSION   . HYPERTENSION   . ALLERGIC RHINITIS   . ESOPHAGEAL STRICTURE   . GERD   . HIATAL HERNIA   . DIVERTICULOSIS, COLON   . BACK PAIN   . OSTEOPOROSIS   . DIZZINESS   . WEIGHT LOSS   . DYSPNEA   . Other dysphagia   . COLONIC POLYPS, HX OF 04/03/2002 & 06/15/2005    TUBULAR ADENOMA  . History of CVA (cerebrovascular accident) 07/29/2012    Noted old, to right thalamus and pons by Head CT Jul 27, 2012  . Dementia 09/17/2012   Past Surgical History  Procedure Laterality Date  . Appendectomy    . Abdominal hysterectomy    . Tonsillectomy and adenoidectomy    . Left ankle surgury    . S/p bladder pubovaginal sling    . S/p cystocele/rectocele repair    . Laparoscopic takedown of incarcerated stomach within the chest/nissen  2009  . Esophageal manometry  12/21/2011    Procedure: ESOPHAGEAL MANOMETRY (EM);  Surgeon: Mardella Layman, MD;  Location: [facility];  Service: Endoscopy;  Laterality: N/A;    reports that she has never smoked. She has  never used smokeless tobacco. She reports that she does not drink alcohol or use illicit drugs. family history includes Colon cancer in her father; Diabetes in her father and mother; Heart attack in her mother; and Heart disease (age of onset: 73) in her mother. Allergies  Allergen Reactions  . Codeine Nausea And Vomiting and Rash  . Hydrocodone-Acetaminophen Nausea And Vomiting and Rash  . Oxycodone-Acetaminophen Nausea And Vomiting and Rash   Current Outpatient Prescriptions on File Prior to Visit  Medication Sig Dispense Refill  . aspirin 81 MG EC tablet Take 81 mg by mouth daily.        Marland Kitchen glucose blood (ONE TOUCH ULTRA TEST) test strip Use as directed one per day   250.02  100 each  11  . Lancets MISC Use as instructed once daily  100 each  11  . Memantine HCl ER (NAMENDA XR) 28 MG CP24 Take 28 mg by mouth daily.  30 capsule  3  . Multiple Vitamin (MULTIVITAMIN WITH MINERALS) TABS Take 1 tablet by mouth daily.      . nitroGLYCERIN (NITROSTAT) 0.4 MG SL tablet Place 0.4 mg under the tongue every 5 (five) minutes x 3 doses as needed. For chest pain.       No current facility-administered medications on file prior to visit.   Review of Systems  Constitutional: Negative for  unexpected weight change, or unusual diaphoresis  HENT: Negative for tinnitus.   Eyes: Negative for photophobia and visual disturbance.  Respiratory: Negative for choking and stridor.   Gastrointestinal: Negative for vomiting and blood in stool.  Genitourinary: Negative for hematuria and decreased urine volume.  Musculoskeletal: Negative for acute joint swelling Skin: Negative for color change and wound.  Neurological: Negative for tremors and numbness other than noted  Psychiatric/Behavioral: Negative for decreased concentration or  hyperactivity.       Objective:   Physical Exam BP 150/90  Pulse 97  Temp(Src) 97.7 F (36.5 C) (Oral)  Ht 5\' 3"  (1.6 m)  Wt 144 lb 4 oz (65.431 kg)  BMI 25.56 kg/m2  SpO2  97% VS noted, not ill appaering Constitutional: Pt appears mild obese, somewhat frail HENT: Head: NCAT.  Right Ear: External ear normal.  Left Ear: External ear normal.  Eyes: Conjunctivae and EOM are normal. Pupils are equal, round, and reactive to light.  Neck: Normal range of motion. Neck supple.  Cardiovascular: Normal rate and regular rhythm.   Pulmonary/Chest: Effort normal and breath sounds normal.  Abd:  Soft, NT, non-distended, + BS Neurological: Pt is alert., motor 5/5 Skin: Skin is warm. No erythema.  Psychiatric: Pt behavior is normal. Thought content c/w dementia, not depressed affect    Assessment & Plan:

## 2013-04-06 NOTE — Patient Instructions (Addendum)
OK to stop the potassium Please take all new medication as prescribed - the losartan 50 mg per day Please keep your appointments with your specialists as you have planned - Dr Terrace Arabia and Dr Antoine Poche Please remember to followup with your GYN for the yearly pap smear and/or mammogram Please continue all other medications as before, and refills have been done if requested. Please have the pharmacy call with any other refills you may need. Please continue your efforts at being more active, low cholesterol diet, and weight control. You are otherwise up to date with prevention measures today. Please go to the LAB in the Basement (turn left off the elevator) for the tests to be done today You will be contacted by phone if any changes need to be made immediately.  Otherwise, you will receive a letter about your results with an explanation, but please check with MyChart first.  Please remember to sign up for My Chart if you have not done so, as this will be important to you in the future with finding out test results, communicating by private email, and scheduling acute appointments online when needed.  Please schedule the bone density test before leaving today at the scheduling desk (where you check out)  Please return in 6 months, or sooner if needed

## 2013-04-09 ENCOUNTER — Encounter: Payer: Self-pay | Admitting: Internal Medicine

## 2013-04-09 NOTE — Assessment & Plan Note (Signed)
stable overall by history and exam, recent data reviewed with pt, and pt to continue medical treatment as before,  to f/u any worsening symptoms or concerns Lab Results  Component Value Date   WBC 8.2 04/06/2013   HGB 11.0* 04/06/2013   HCT 32.5* 04/06/2013   PLT 309.0 04/06/2013   GLUCOSE 110* 04/06/2013   CHOL 253* 04/06/2013   TRIG 90.0 04/06/2013   HDL 69.70 04/06/2013   LDLDIRECT 158.0 04/06/2013   LDLCALC  Value: 96        Total Cholesterol/HDL:CHD Risk Coronary Heart Disease Risk Table                     Men   Women  1/2 Average Risk   3.4   3.3  Average Risk       5.0   4.4  2 X Average Risk   9.6   7.1  3 X Average Risk  23.4   11.0        Use the calculated Patient Ratio above and the CHD Risk Table to determine the patient's CHD Risk.        ATP III CLASSIFICATION (LDL):  <100     mg/dL   Optimal  161-096  mg/dL   Near or Above                    Optimal  130-159  mg/dL   Borderline  045-409  mg/dL   High  >811     mg/dL   Very High 05/21/7828   ALT 12 04/06/2013   AST 15 04/06/2013   NA 138 04/06/2013   K 3.4* 04/06/2013   CL 100 04/06/2013   CREATININE 1.0 04/06/2013   BUN 15 04/06/2013   CO2 29 04/06/2013   TSH 2.04 04/06/2013   INR 0.92 05/18/2012   HGBA1C 7.4* 04/06/2013   MICROALBUR 24.1* 04/06/2013

## 2013-04-09 NOTE — Assessment & Plan Note (Signed)
stable overall by history and exam, recent data reviewed with pt, and pt to continue medical treatment as before,  to f/u any worsening symptoms or concerns Lab Results  Component Value Date   [facility]  Value: 96        Total Cholesterol/HDL:CHD Risk Coronary Heart Disease Risk Table                     Men   Women  1/2 Average Risk   3.4   3.3  Average Risk       5.0   4.4  2 X Average Risk   9.6   7.1  3 X Average Risk  23.4   11.0        Use the calculated Patient Ratio above and the CHD Risk Table to determine the patient's CHD Risk.        ATP III CLASSIFICATION (LDL):  <100     mg/dL   Optimal  119-147  mg/dL   Near or Above                    Optimal  130-159  mg/dL   Borderline  829-562  mg/dL   High  >130     mg/dL   Very High 8/65/7846

## 2013-04-09 NOTE — Assessment & Plan Note (Signed)
stable overall by history and exam, recent data reviewed with pt, and pt to continue medical treatment as before,  to f/u any worsening symptoms or concerns Lab Results  Component Value Date   HGBA1C 7.4* 04/06/2013

## 2013-04-13 ENCOUNTER — Telehealth: Payer: Self-pay

## 2013-04-13 NOTE — Telephone Encounter (Signed)
A user error has taken place: encounter opened in error, closed for administrative reasons.

## 2013-04-25 ENCOUNTER — Telehealth: Payer: Self-pay | Admitting: Neurology

## 2013-04-26 NOTE — Telephone Encounter (Signed)
Left a message on the designated vm relaying Dr Zannie Cove instructions.  Contact information was also given so that the patient may call back if they have any questions.

## 2013-04-26 NOTE — Telephone Encounter (Signed)
Please call patient, It is Ok to stop Namenda at this point, it will also give her and her family an opportunity to see if Candiss Norse has helped her or not.

## 2013-05-11 ENCOUNTER — Telehealth: Payer: Self-pay | Admitting: Neurology

## 2013-05-12 NOTE — Telephone Encounter (Signed)
Spoke to [place]. She says patients paranoia and hallucinations have increased. Thinks Namenda should be adjusted. The patient's last OV was 07/08/2012 w/ NP-Carolyn Daphine Deutscher. Patient did not f/u in 6 months as directed. Requesting to follow up asap because memory loss symptoms are increasing. I scheduled the patient for 06/15/13 w/ NP-CM, added patient to wait-list also.

## 2013-05-15 NOTE — Telephone Encounter (Signed)
Dispensed [employer] Kit #2 Lot [id number] Exp 04/2016.

## 2013-05-15 NOTE — Telephone Encounter (Signed)
Called and left message for [name] message says Namenda unavailable, she was on 28mg  XR when seen last Oct 2013, made aware she could pick up a starter pack of 10mg  BID namenda from [employer]. Pt has failed Aricept in the past. Had to leave message

## 2013-05-22 ENCOUNTER — Emergency Department (HOSPITAL_COMMUNITY): Payer: Medicare Other

## 2013-05-22 ENCOUNTER — Encounter (HOSPITAL_COMMUNITY): Payer: Self-pay | Admitting: *Deleted

## 2013-05-22 ENCOUNTER — Inpatient Hospital Stay (HOSPITAL_COMMUNITY)
Admission: EM | Admit: 2013-05-22 | Discharge: 2013-05-23 | DRG: 069 | Disposition: A | Discharge status: Home Health | Payer: Medicare Other | Attending: Internal Medicine | Admitting: Internal Medicine

## 2013-05-22 DIAGNOSIS — I351 Nonrheumatic aortic (valve) insufficiency: Secondary | ICD-10-CM

## 2013-05-22 DIAGNOSIS — F3289 Other specified depressive episodes: Secondary | ICD-10-CM

## 2013-05-22 DIAGNOSIS — N179 Acute kidney failure, unspecified: Secondary | ICD-10-CM

## 2013-05-22 DIAGNOSIS — Z860100 Personal history of colon polyps, unspecified: Secondary | ICD-10-CM

## 2013-05-22 DIAGNOSIS — R350 Frequency of micturition: Secondary | ICD-10-CM

## 2013-05-22 DIAGNOSIS — E785 Hyperlipidemia, unspecified: Secondary | ICD-10-CM

## 2013-05-22 DIAGNOSIS — Z8673 Personal history of transient ischemic attack (TIA), and cerebral infarction without residual deficits: Secondary | ICD-10-CM

## 2013-05-22 DIAGNOSIS — E860 Dehydration: Secondary | ICD-10-CM

## 2013-05-22 DIAGNOSIS — R490 Dysphonia: Secondary | ICD-10-CM

## 2013-05-22 DIAGNOSIS — F22 Delusional disorders: Secondary | ICD-10-CM

## 2013-05-22 DIAGNOSIS — I10 Essential (primary) hypertension: Secondary | ICD-10-CM

## 2013-05-22 DIAGNOSIS — E1140 Type 2 diabetes mellitus with diabetic neuropathy, unspecified: Secondary | ICD-10-CM | POA: Diagnosis present

## 2013-05-22 DIAGNOSIS — R209 Unspecified disturbances of skin sensation: Secondary | ICD-10-CM | POA: Diagnosis present

## 2013-05-22 DIAGNOSIS — F0390 Unspecified dementia without behavioral disturbance: Secondary | ICD-10-CM

## 2013-05-22 DIAGNOSIS — Z800 Family history of malignant neoplasm of digestive organs: Secondary | ICD-10-CM

## 2013-05-22 DIAGNOSIS — R079 Chest pain, unspecified: Secondary | ICD-10-CM

## 2013-05-22 DIAGNOSIS — R0609 Other forms of dyspnea: Secondary | ICD-10-CM

## 2013-05-22 DIAGNOSIS — E669 Obesity, unspecified: Secondary | ICD-10-CM

## 2013-05-22 DIAGNOSIS — K219 Gastro-esophageal reflux disease without esophagitis: Secondary | ICD-10-CM

## 2013-05-22 DIAGNOSIS — M549 Dorsalgia, unspecified: Secondary | ICD-10-CM

## 2013-05-22 DIAGNOSIS — R110 Nausea: Secondary | ICD-10-CM

## 2013-05-22 DIAGNOSIS — G459 Transient cerebral ischemic attack, unspecified: Principal | ICD-10-CM

## 2013-05-22 DIAGNOSIS — R5381 Other malaise: Secondary | ICD-10-CM

## 2013-05-22 DIAGNOSIS — M810 Age-related osteoporosis without current pathological fracture: Secondary | ICD-10-CM

## 2013-05-22 DIAGNOSIS — K222 Esophageal obstruction: Secondary | ICD-10-CM

## 2013-05-22 DIAGNOSIS — Z0000 Encounter for general adult medical examination without abnormal findings: Secondary | ICD-10-CM

## 2013-05-22 DIAGNOSIS — R634 Abnormal weight loss: Secondary | ICD-10-CM

## 2013-05-22 DIAGNOSIS — F329 Major depressive disorder, single episode, unspecified: Secondary | ICD-10-CM

## 2013-05-22 DIAGNOSIS — D509 Iron deficiency anemia, unspecified: Secondary | ICD-10-CM

## 2013-05-22 DIAGNOSIS — IMO0001 Reserved for inherently not codable concepts without codable children: Secondary | ICD-10-CM

## 2013-05-22 DIAGNOSIS — J309 Allergic rhinitis, unspecified: Secondary | ICD-10-CM

## 2013-05-22 DIAGNOSIS — K449 Diaphragmatic hernia without obstruction or gangrene: Secondary | ICD-10-CM

## 2013-05-22 DIAGNOSIS — Z87440 Personal history of urinary (tract) infections: Secondary | ICD-10-CM

## 2013-05-22 DIAGNOSIS — K5730 Diverticulosis of large intestine without perforation or abscess without bleeding: Secondary | ICD-10-CM

## 2013-05-22 DIAGNOSIS — R1319 Other dysphagia: Secondary | ICD-10-CM

## 2013-05-22 DIAGNOSIS — K5900 Constipation, unspecified: Secondary | ICD-10-CM

## 2013-05-22 DIAGNOSIS — N390 Urinary tract infection, site not specified: Secondary | ICD-10-CM | POA: Diagnosis present

## 2013-05-22 LAB — CBC WITH DIFFERENTIAL/PLATELET
Eosinophils Absolute: 0.1 10*3/uL (ref 0.0–0.7)
Lymphocytes Relative: 42 % (ref 12–46)
Lymphs Abs: 3.1 10*3/uL (ref 0.7–4.0)
MCH: 28.6 pg (ref 26.0–34.0)
Neutrophils Relative %: 48 % (ref 43–77)
Platelets: 302 10*3/uL (ref 150–400)
RBC: 3.64 MIL/uL — ABNORMAL LOW (ref 3.87–5.11)
WBC: 7.4 10*3/uL (ref 4.0–10.5)

## 2013-05-22 LAB — COMPREHENSIVE METABOLIC PANEL
ALT: 14 U/L (ref 0–35)
Alkaline Phosphatase: 66 U/L (ref 39–117)
GFR calc Af Amer: 45 mL/min — ABNORMAL LOW (ref >90)
Glucose, Bld: 197 mg/dL — ABNORMAL HIGH (ref 70–99)
Potassium: 3.6 mEq/L (ref 3.5–5.1)
Sodium: 135 mEq/L (ref 135–145)
Total Protein: 7.5 g/dL (ref 6.0–8.3)

## 2013-05-22 LAB — URINE MICROSCOPIC-ADD ON

## 2013-05-22 LAB — URINALYSIS, ROUTINE W REFLEX MICROSCOPIC
Glucose, UA: NEGATIVE mg/dL
Hgb urine dipstick: NEGATIVE
Protein, ur: NEGATIVE mg/dL
Specific Gravity, Urine: 1.014 (ref 1.005–1.030)
pH: 6.5 (ref 5.0–8.0)

## 2013-05-22 LAB — GLUCOSE, CAPILLARY: Glucose-Capillary: 89 mg/dL (ref 70–99)

## 2013-05-22 MED ORDER — SODIUM CHLORIDE 0.9 % IV SOLN
INTRAVENOUS | Status: AC
  Administered 2013-05-22 – 2013-05-23 (×2): via INTRAVENOUS

## 2013-05-22 MED ORDER — INSULIN ASPART 100 UNIT/ML ~~LOC~~ SOLN: 0.0000 [IU] | Freq: Three times a day (TID) | SUBCUTANEOUS | Status: DC

## 2013-05-22 MED ORDER — ASPIRIN EC 81 MG PO TBEC
81.0000 mg | DELAYED_RELEASE_TABLET | Freq: Every day | ORAL | Status: DC
  Administered 2013-05-23: 81 mg via ORAL
  Filled 2013-05-22: qty 1

## 2013-05-22 MED ORDER — MEMANTINE HCL 10 MG PO TABS
10.0000 mg | ORAL_TABLET | Freq: Two times a day (BID) | ORAL | Status: DC
  Administered 2013-05-22 – 2013-05-23 (×2): 10 mg via ORAL
  Filled 2013-05-22 (×3): qty 1

## 2013-05-22 MED ORDER — DEXTROSE 5 % IV SOLN
1.0000 g | INTRAVENOUS | Status: DC
  Filled 2013-05-22: qty 10

## 2013-05-22 MED ORDER — DEXTROSE 5 % IV SOLN
1.0000 g | Freq: Once | INTRAVENOUS | Status: AC
  Administered 2013-05-22: 1 g via INTRAVENOUS
  Filled 2013-05-22: qty 10

## 2013-05-22 MED ORDER — LOSARTAN POTASSIUM 50 MG PO TABS
50.0000 mg | ORAL_TABLET | Freq: Every day | ORAL | Status: DC
  Administered 2013-05-23: 50 mg via ORAL
  Filled 2013-05-22: qty 1

## 2013-05-22 MED ORDER — SODIUM CHLORIDE 0.9 % IV SOLN
INTRAVENOUS | Status: DC
  Administered 2013-05-22: 20:00:00 via INTRAVENOUS

## 2013-05-22 MED ORDER — ATORVASTATIN CALCIUM 10 MG PO TABS
10.0000 mg | ORAL_TABLET | Freq: Every day | ORAL | Status: DC
  Administered 2013-05-23: 11:00:00 10 mg via ORAL
  Filled 2013-05-22: qty 1

## 2013-05-22 MED ORDER — INSULIN GLARGINE 100 UNIT/ML ~~LOC~~ SOLN
20.0000 [IU] | Freq: Every day | SUBCUTANEOUS | Status: DC
  Administered 2013-05-23: 20 [IU] via SUBCUTANEOUS
  Filled 2013-05-22: qty 0.2

## 2013-05-22 MED ORDER — ASPIRIN 81 MG PO TBEC: 81.0000 mg | DELAYED_RELEASE_TABLET | Freq: Every day | ORAL | Status: DC

## 2013-05-22 MED ORDER — INFLUENZA VAC SPLIT QUAD 0.5 ML IM SUSP
0.5000 mL | INTRAMUSCULAR | Status: AC
  Administered 2013-05-23: 0.5 mL via INTRAMUSCULAR
  Filled 2013-05-22 (×2): qty 0.5

## 2013-05-22 MED ORDER — AMLODIPINE BESYLATE 10 MG PO TABS
10.0000 mg | ORAL_TABLET | Freq: Every day | ORAL | Status: DC
  Administered 2013-05-23: 11:00:00 10 mg via ORAL
  Filled 2013-05-22: qty 1

## 2013-05-22 MED ORDER — HEPARIN SODIUM (PORCINE) 5000 UNIT/ML IJ SOLN
5000.0000 [IU] | Freq: Three times a day (TID) | INTRAMUSCULAR | Status: DC
  Administered 2013-05-22 – 2013-05-23 (×3): 5000 [IU] via SUBCUTANEOUS
  Filled 2013-05-22 (×6): qty 1

## 2013-05-22 MED ORDER — LORAZEPAM 2 MG/ML IJ SOLN
0.5000 mg | Freq: Once | INTRAMUSCULAR | Status: AC
  Administered 2013-05-22: 0.5 mg via INTRAVENOUS
  Filled 2013-05-22: qty 1

## 2013-05-22 MED ORDER — FAMOTIDINE 20 MG PO TABS
20.0000 mg | ORAL_TABLET | Freq: Two times a day (BID) | ORAL | Status: DC | PRN
  Administered 2013-05-22: 20 mg via ORAL
  Filled 2013-05-22: qty 1

## 2013-05-22 NOTE — ED Notes (Signed)
Patient went to MRI, couldn't do vitals

## 2013-05-22 NOTE — Progress Notes (Signed)
Utilization Review completed.  Jennaya Pogue RN CM  

## 2013-05-22 NOTE — ED Provider Notes (Signed)
CSN: [id number]     Arrival date & time 05/22/13  1510 History   First MD Initiated Contact with Patient 05/22/13 1533     Chief Complaint  Patient presents with  . facial numbness   . Weakness  . generalized complaints    (Consider location/radiation/quality/duration/timing/severity/associated sxs/prior Treatment) HPI Comments: Presents with multiple complaints. Patient reports that she has had generalized weakness in her arms and legs for one week. Today she has noticed numbness and tingling of her lips and on the left side of her face up into the scalp region. Patient reports that she had similar symptoms in the past and was told it was a "mini stroke". Patient reports that while she was waiting in the waiting room today, she had her vision "blackout" for one or 2 minutes and then come back. It is back to normal. Patient reports that when she turns her head to the side she gets severe dizziness.  Patient also asked for something for "heartburn". She says that she feels "weird" in her chest. She cannot describe it any better than that. She does report shortness of breath associated with the symptoms. No nausea or diaphoresis.  Patient is a 79 y.o. female presenting with weakness.  Weakness Associated symptoms include chest pain and shortness of breath.    Past Medical History  Diagnosis Date  . COLONIC POLYPS 06/15/2005  . DIABETES MELLITUS, TYPE II 04/22/2007  . HYPERCHOLESTEROLEMIA   . HYPERLIPIDEMIA   . OBESITY   . ANEMIA-IRON DEFICIENCY   . DEPRESSION   . HYPERTENSION   . ALLERGIC RHINITIS   . ESOPHAGEAL STRICTURE   . GERD   . HIATAL HERNIA   . DIVERTICULOSIS, COLON   . BACK PAIN   . OSTEOPOROSIS   . DIZZINESS   . WEIGHT LOSS   . DYSPNEA   . Other dysphagia   . COLONIC POLYPS, HX OF 04/03/2002 & 06/15/2005    TUBULAR ADENOMA  . History of CVA (cerebrovascular accident) 07/29/2012    Noted old, to right thalamus and pons by Head CT Jul 27, 2012  . Dementia 09/17/2012  .  Type II or unspecified type diabetes mellitus without mention of complication, uncontrolled 07/16/2012   Past Surgical History  Procedure Laterality Date  . Appendectomy    . Abdominal hysterectomy    . Tonsillectomy and adenoidectomy    . Left ankle surgury    . S/p bladder pubovaginal sling    . S/p cystocele/rectocele repair    . Laparoscopic takedown of incarcerated stomach within the chest/nissen  2009  . Esophageal manometry  12/21/2011    Procedure: ESOPHAGEAL MANOMETRY (EM);  Surgeon: Mardella Layman, MD;  Location: [facility];  Service: Endoscopy;  Laterality: N/A;   Family History  Problem Relation Age of Onset  . Heart disease Mother 83  . Diabetes Mother   . Heart attack Mother   . Colon cancer Father   . Diabetes Father    History  Substance Use Topics  . Smoking status: Never Smoker   . Smokeless tobacco: Never Used  . Alcohol Use: No   OB History   Grav Para Term Preterm Abortions TAB SAB Ect Mult Living                 Review of Systems  Eyes: Positive for visual disturbance.  Respiratory: Positive for shortness of breath.   Cardiovascular: Positive for chest pain.  Neurological: Positive for dizziness, weakness and numbness.  All other systems reviewed and are  negative.    Allergies  Codeine; Hydrocodone-acetaminophen; and Oxycodone-acetaminophen  Home Medications   Current Outpatient Rx  Name  Route  Sig  Dispense  Refill  . amLODipine (NORVASC) 10 MG tablet   Oral   Take 1 tablet (10 mg total) by mouth daily.   90 tablet   3   . aspirin 81 MG EC tablet   Oral   Take 81 mg by mouth daily.           Marland Kitchen atorvastatin (LIPITOR) 10 MG tablet   Oral   Take 1 tablet (10 mg total) by mouth daily.   90 tablet   3   . cephALEXin (KEFLEX) 500 MG capsule   Oral   Take 1 capsule (500 mg total) by mouth 4 (four) times daily.   40 capsule   0   . glucose blood (ONE TOUCH ULTRA TEST) test strip      Use as directed one per day   250.02    100 each   11   . insulin glargine (LANTUS) 100 UNIT/ML injection   Subcutaneous   Inject 0.4 mLs (40 Units total) into the skin daily.   10 mL   11   . Lancets MISC      Use as instructed once daily   100 each   11   . losartan (COZAAR) 50 MG tablet   Oral   Take 1 tablet (50 mg total) by mouth daily.   90 tablet   3   . Memantine HCl ER (NAMENDA XR) 28 MG CP24   Oral   Take 28 mg by mouth daily.   30 capsule   3   . metFORMIN (GLUCOPHAGE) 500 MG tablet   Oral   Take 1 tablet (500 mg total) by mouth 2 (two) times daily with a meal.   180 tablet   3   . Multiple Vitamin (MULTIVITAMIN WITH MINERALS) TABS   Oral   Take 1 tablet by mouth daily.         . nitroGLYCERIN (NITROSTAT) 0.4 MG SL tablet   Sublingual   Place 0.4 mg under the tongue every 5 (five) minutes x 3 doses as needed. For chest pain.          BP 129/62  Pulse 85  Temp(Src) 98.1 F (36.7 C) (Oral)  Resp 16 Physical Exam  Constitutional: She is oriented to person, place, and time. She appears well-developed and well-nourished. No distress.  HENT:  Head: Normocephalic and atraumatic.  Right Ear: Hearing normal.  Left Ear: Hearing normal.  Nose: Nose normal.  Mouth/Throat: Oropharynx is clear and moist and mucous membranes are normal.  Eyes: Conjunctivae and EOM are normal. Pupils are equal, round, and reactive to light.  Neck: Normal range of motion. Neck supple.  Cardiovascular: Regular rhythm, S1 normal and S2 normal.  Exam reveals no gallop and no friction rub.   No murmur heard. Pulmonary/Chest: Effort normal and breath sounds normal. No respiratory distress. She exhibits no tenderness.  Abdominal: Soft. Normal appearance and bowel sounds are normal. There is no hepatosplenomegaly. There is no tenderness. There is no rebound, no guarding, no tenderness at McBurney's point and negative Murphy's sign. No hernia.  Musculoskeletal: Normal range of motion.  Neurological: She is alert and  oriented to person, place, and time. She has normal strength. No cranial nerve deficit or sensory deficit. Coordination normal. GCS eye subscore is 4. GCS verbal subscore is 5. GCS motor subscore is 6.  Skin: Skin is warm, dry and intact. No rash noted. No cyanosis.  Psychiatric: Her speech is normal and behavior is normal. Thought content normal. Her mood appears anxious.  tearful    ED Course  Procedures (including critical care time)   Date: 05/22/2013  Rate: 88  Rhythm: normal sinus rhythm  QRS Axis: normal  Intervals: normal  ST/T Wave abnormalities: nonspecific ST/T changes  Conduction Disutrbances:none  Narrative Interpretation:   Old EKG Reviewed: unchanged    Labs Review Labs Reviewed  CBC WITH DIFFERENTIAL - Abnormal; Notable for the following:    RBC 3.64 (*)    Hemoglobin 10.4 (*)    HCT 31.4 (*)    All other components within normal limits  COMPREHENSIVE METABOLIC PANEL - Abnormal; Notable for the following:    Glucose, Bld 197 (*)    Creatinine, Ser 1.28 (*)    GFR calc non Af Amer 38 (*)    GFR calc Af Amer 45 (*)    All other components within normal limits  URINALYSIS, ROUTINE W REFLEX MICROSCOPIC - Abnormal; Notable for the following:    APPearance CLOUDY (*)    Leukocytes, UA LARGE (*)    All other components within normal limits  URINE MICROSCOPIC-ADD ON - Abnormal; Notable for the following:    Bacteria, UA MANY (*)    All other components within normal limits  URINE CULTURE  TROPONIN I  PRO B NATRIURETIC PEPTIDE   Imaging Review Ct Head Wo Contrast  05/22/2013   *RADIOLOGY REPORT*  Clinical Data: Dizziness.  Left facial numbness.  CT HEAD WITHOUT CONTRAST  Technique:  Contiguous axial images were obtained from the base of the skull through the vertex without contrast.  Comparison: 07/27/2012.  Findings: No intracranial hemorrhage.  Remote right thalamic infarct.  Small vessel disease type changes without CT findings of large acute infarct.   Difficult to exclude very small acute left paracentral pontine infarct as versus artifact.  Polypoid opacification maxillary sinuses greater on the right.  IMPRESSION: No intracranial hemorrhage.  Remote right thalamic infarct.  Small vessel disease type changes without CT findings of large acute infarct.  Difficult to exclude very small acute left paracentral pontine infarct as versus artifact.  Polypoid opacification maxillary sinuses greater on the right.   Original Report Authenticated By: Lacy Duverney, M.D.   Mr Brain Wo Contrast  05/22/2013   *RADIOLOGY REPORT*  Clinical Data: Dizziness.  Left facial numbness.  Arm weakness. Abnormal CT.  Rule out CVA  MRI HEAD WITHOUT CONTRAST  Technique:  Multiplanar, multiecho pulse sequences of the brain and surrounding structures were obtained according to standard protocol without intravenous contrast.  Comparison: CT 05/22/2013.  MRI 08/02/2012  Findings: Negative for acute infarct.  Generalized mild atrophy.  Chronic lacunar infarctions in the thalami bilaterally.  Chronic lacunar infarction in the left pons corresponds to the CT hypodensity. Mild chronic microvascular ischemic change in the white matter.  Negative for intracranial hemorrhage.  Negative for hemorrhage or mass lesion.  Negative for hydrocephalus.  Mucosal edema in the paranasal sinuses bilaterally.  IMPRESSION: Atrophy and chronic ischemic change.  No acute abnormality.   Original Report Authenticated By: Janeece Riggers, M.D.   Dg Chest Port 1 View  05/22/2013   *RADIOLOGY REPORT*  Clinical Data: Chest pain.  PORTABLE CHEST - 1 VIEW  Comparison: 07/27/2012  Findings: Upper normal heart size.  Clear lungs.  Proximal left humerus deformity is stable. No pneumothorax or pleural effusion.  IMPRESSION: No active cardiopulmonary disease.  Original Report Authenticated By: Jolaine Click, M.D.    MDM   1. TIA (transient ischemic attack)   2. Chest pain   3. UTI (lower urinary tract infection)   4.  Dehydration      patient presents to the ER for evaluation of multiple complaints. Patient's main complaint is numbness of the face. She reports pain across the upper and lower lip bilaterally, but has specific numbness, tingling of the left side of her face up into the scalp region. I do not see any obvious facial asymmetry however. CT scan was performed there was possible. She seen, so MRI was performed. MRI shows no evidence of acute stroke. Patient does report previous episodes of TIA-like symptoms. She has not had carotid Dopplers or cardiac echo, however. She will need TIA work up.  Additionally, patient is complaining of chest pain. She feels like it might be heartburn. EKG was nonspecific and unchanged from previous. Troponin is negative. Patient does have multiple cardiac risk factors. Her pain has resolved without intervention here in the ER.  Additionally, patient has been complaining of generalized weakness. She had no specific symptoms to go with this. She was found to have what is likely urinary tract infection by urinalysis which may explain some of her generalized weakness. She will be treated with Rocephin. Culture pending.    Gilda Crease, MD 05/22/13 [phone]

## 2013-05-22 NOTE — ED Notes (Signed)
Report given to receiving floor RN Dawn.

## 2013-05-22 NOTE — ED Notes (Signed)
Patient transported to MRI 

## 2013-05-22 NOTE — ED Notes (Signed)
Ct came to take pt to get scanned, will resume upon arrival back to room.

## 2013-05-22 NOTE — ED Notes (Signed)
Pt reports facial numbness. Arm weakness x 1 week, general vague symptoms. Hx of tia. Pt crying for no reason. Reports her vision went out a few minutes ago. Denies pain

## 2013-05-22 NOTE — H&P (Addendum)
[facility] History and Physical  [name] ZOX:[id number] DOB: 11-18-1930 DOA: 05/22/2013  Referring physician: Dr. Blinda Leatherwood PCP: Oliver Barre, MD  Specialists: none  Chief Complaint: perioral numbness  HPI: [name] is a 79 y.o. female has a past medical history significant for previous TIA, recurrent UTIs, Dementia, HTN, DM, presents with a chief complaint of numbness in her face around her mouth, mainly on the left side, of sudden onset today. She also complains of generalized weakness in her arms and feet for the past couple of days. Today she also experienced chest pain which she describes being in the middle of her chest, without irradiation, and sharp in quality. She called her son when she was feeling numb and he brought her to the ED. She also reports that she had a fall in the past week due to her "legs being weak". She also complains of intermittent palpitations, none today but and and off for some time. Denies LOC. HAs a history of recurrent UTIs and reports dysuria today, but states that it is somewhat chronic. Denies fever/chills, denies shortness of breath, denies abdominal pain, nausea, vomiting or diarrhea. In the ED patient underwent CT scan of the head as well as MRI which was negative for CVA. UA with evidence of UTI and she has mild AKI.   Review of Systems: as per HPI otherwise negative.   Past Medical History  Diagnosis Date  . COLONIC POLYPS 06/15/2005  . DIABETES MELLITUS, TYPE II 04/22/2007  . HYPERCHOLESTEROLEMIA   . HYPERLIPIDEMIA   . OBESITY   . ANEMIA-IRON DEFICIENCY   . DEPRESSION   . HYPERTENSION   . ALLERGIC RHINITIS   . ESOPHAGEAL STRICTURE   . GERD   . HIATAL HERNIA   . DIVERTICULOSIS, COLON   . BACK PAIN   . OSTEOPOROSIS   . DIZZINESS   . WEIGHT LOSS   . DYSPNEA   . Other dysphagia   . COLONIC POLYPS, HX OF 04/03/2002 & 06/15/2005    TUBULAR ADENOMA  . History of CVA (cerebrovascular accident) 07/29/2012    Noted old, to right  thalamus and pons by Head CT Jul 27, 2012  . Dementia 09/17/2012  . Type II or unspecified type diabetes mellitus without mention of complication, uncontrolled 07/16/2012   Past Surgical History  Procedure Laterality Date  . Appendectomy    . Abdominal hysterectomy    . Tonsillectomy and adenoidectomy    . Left ankle surgury    . S/p bladder pubovaginal sling    . S/p cystocele/rectocele repair    . Laparoscopic takedown of incarcerated stomach within the chest/nissen  2009  . Esophageal manometry  12/21/2011    Procedure: ESOPHAGEAL MANOMETRY (EM);  Surgeon: Mardella Layman, MD;  Location: [facility];  Service: Endoscopy;  Laterality: N/A;   Social History:  reports that she has never smoked. She has never used smokeless tobacco. She reports that she does not drink alcohol or use illicit drugs.  Allergies  Allergen Reactions  . Codeine Nausea And Vomiting and Rash  . Hydrocodone-Acetaminophen Nausea And Vomiting and Rash  . Oxycodone-Acetaminophen Nausea And Vomiting and Rash    Family History  Problem Relation Age of Onset  . Heart disease Mother 53  . Diabetes Mother   . Heart attack Mother   . Colon cancer Father   . Diabetes Father    Prior to Admission medications   Medication Sig Start Date End Date Taking? Authorizing Provider  amLODipine (NORVASC) 10 MG tablet  Take 1 tablet (10 mg total) by mouth daily. 04/06/13  Yes Corwin Levins, MD  aspirin 81 MG EC tablet Take 81 mg by mouth daily.     Yes Historical Provider, MD  atorvastatin (LIPITOR) 10 MG tablet Take 1 tablet (10 mg total) by mouth daily. 04/06/13  Yes Corwin Levins, MD  insulin glargine (LANTUS) 100 UNIT/ML injection Inject 0.4 mLs (40 Units total) into the skin daily. 04/06/13  Yes Corwin Levins, MD  losartan (COZAAR) 50 MG tablet Take 1 tablet (50 mg total) by mouth daily. 04/06/13  Yes Corwin Levins, MD  memantine (NAMENDA) 5 MG tablet Take 10 mg by mouth 2 (two) times daily.   Yes Historical Provider, MD   metFORMIN (GLUCOPHAGE) 500 MG tablet Take 1 tablet (500 mg total) by mouth 2 (two) times daily with a meal. 04/06/13  Yes Corwin Levins, MD  nitroGLYCERIN (NITROSTAT) 0.4 MG SL tablet Place 0.4 mg under the tongue every 5 (five) minutes as needed for chest pain.    Yes Historical Provider, MD  glucose blood (ONE TOUCH ULTRA TEST) test strip Use as directed one per day   250.02 07/14/12   Corwin Levins, MD  Lancets MISC Use as instructed once daily 03/01/12   Corwin Levins, MD   Physical Exam: Filed Vitals:   05/22/13 1534 05/22/13 1850  BP: 129/62 128/64  Pulse: 85 80  Temp: 98.1 F (36.7 C) 98 F (36.7 C)  TempSrc: Oral Oral  Resp: 16 18  SpO2:  98%     General:  No apparent distress, pleasant caucasian female  Eyes: no scleral icterus  ENT: moist oropharynx  Neck: supple, no JVD  Cardiovascular: regular rate without MRG; 2+ peripheral pulses  Respiratory: CTA biL, good air movement without wheezing, rhonchi or crackled  Abdomen: soft, non tender to palpation, positive bowel sounds, no guarding, no rebound  Skin: no rashes  Musculoskeletal: trace peripheral edema  Psychiatric: normal mood and affect  Neurologic: CN 2-12 grossly intact, MS 5/5 in all 4  Labs on Admission:  Basic Metabolic Panel:  Recent Labs Lab 05/22/13 1640  NA 135  K 3.6  CL 97  CO2 24  GLUCOSE 197*  BUN 16  CREATININE 1.28*  CALCIUM 10.3   Liver Function Tests:  Recent Labs Lab 05/22/13 1640  AST 19  ALT 14  ALKPHOS 66  BILITOT 0.3  PROT 7.5  ALBUMIN 3.6   CBC:  Recent Labs Lab 05/22/13 1640  WBC 7.4  NEUTROABS 3.5  HGB 10.4*  HCT 31.4*  MCV 86.3  PLT 302   Cardiac Enzymes:  Recent Labs Lab 05/22/13 1640  TROPONINI <0.30    BNP (last 3 results)  Recent Labs  05/22/13 1640  PROBNP 173.3   Radiological Exams on Admission: Ct Head Wo Contrast  05/22/2013   *RADIOLOGY REPORT*  Clinical Data: Dizziness.  Left facial numbness.  CT HEAD WITHOUT CONTRAST   Technique:  Contiguous axial images were obtained from the base of the skull through the vertex without contrast.  Comparison: 07/27/2012.  Findings: No intracranial hemorrhage.  Remote right thalamic infarct.  Small vessel disease type changes without CT findings of large acute infarct.  Difficult to exclude very small acute left paracentral pontine infarct as versus artifact.  Polypoid opacification maxillary sinuses greater on the right.  IMPRESSION: No intracranial hemorrhage.  Remote right thalamic infarct.  Small vessel disease type changes without CT findings of large acute infarct.  Difficult to exclude  very small acute left paracentral pontine infarct as versus artifact.  Polypoid opacification maxillary sinuses greater on the right.   Original Report Authenticated By: Lacy Duverney, M.D.   Mr Brain Wo Contrast  05/22/2013   *RADIOLOGY REPORT*  Clinical Data: Dizziness.  Left facial numbness.  Arm weakness. Abnormal CT.  Rule out CVA  MRI HEAD WITHOUT CONTRAST  Technique:  Multiplanar, multiecho pulse sequences of the brain and surrounding structures were obtained according to standard protocol without intravenous contrast.  Comparison: CT 05/22/2013.  MRI 08/02/2012  Findings: Negative for acute infarct.  Generalized mild atrophy.  Chronic lacunar infarctions in the thalami bilaterally.  Chronic lacunar infarction in the left pons corresponds to the CT hypodensity. Mild chronic microvascular ischemic change in the white matter.  Negative for intracranial hemorrhage.  Negative for hemorrhage or mass lesion.  Negative for hydrocephalus.  Mucosal edema in the paranasal sinuses bilaterally.  IMPRESSION: Atrophy and chronic ischemic change.  No acute abnormality.   Original Report Authenticated By: Janeece Riggers, M.D.   Dg Chest Port 1 View  05/22/2013   *RADIOLOGY REPORT*  Clinical Data: Chest pain.  PORTABLE CHEST - 1 VIEW  Comparison: 07/27/2012  Findings: Upper normal heart size.  Clear lungs.  Proximal  left humerus deformity is stable. No pneumothorax or pleural effusion.  IMPRESSION: No active cardiopulmonary disease.   Original Report Authenticated By: Jolaine Click, M.D.   EKG: Independently reviewed.  Assessment/Plan Principal Problem:   TIA (transient ischemic attack) Active Problems:   HYPERLIPIDEMIA   ANEMIA-IRON DEFICIENCY   DEPRESSION   HYPERTENSION   GERD   FATIGUE   WEIGHT LOSS   Type II or unspecified type diabetes mellitus without mention of complication, uncontrolled   History of CVA (cerebrovascular accident)   Dementia   Chest pain   UTI (urinary tract infection)   AKI (acute kidney injury)    TIA - MRI negative for stroke. Complete workup with 2D echo, carotid dopplers. Continue aspirin. Telemetry.  Chest pain - telemetry, CEx 3. First troponin negative. UTI - no prior history of drug resistant organisms, Ceftriaxone, follow urine cultures.  AKI - due to probable dehydration and #3. Hydrate and repeat BMP in am.  DM - continue home Lantus at decreased dose, SSI. Obtain HBA1C. HTN - continue home medications DVT Prophylaxis - heparin.    Code Status: Full  Family Communication: son bedside  Disposition Plan: inpatient  Time spent: 15  Costin M. Elvera Lennox, MD [facility] Pager [phone]  If 7PM-7AM, please contact night-coverage [email or web address] Password [facility] 05/22/2013, 7:55 PM

## 2013-05-23 ENCOUNTER — Encounter (HOSPITAL_COMMUNITY): Payer: Self-pay

## 2013-05-23 DIAGNOSIS — G459 Transient cerebral ischemic attack, unspecified: Secondary | ICD-10-CM

## 2013-05-23 LAB — HEMOGLOBIN A1C: Hgb A1c MFr Bld: 10.1 % — ABNORMAL HIGH (ref <5.7)

## 2013-05-23 LAB — BASIC METABOLIC PANEL
Calcium: 9.3 mg/dL (ref 8.4–10.5)
Chloride: 105 mEq/L (ref 96–112)
Creatinine, Ser: 1.16 mg/dL — ABNORMAL HIGH (ref 0.50–1.10)
GFR calc Af Amer: 50 mL/min — ABNORMAL LOW (ref >90)
Sodium: 140 mEq/L (ref 135–145)

## 2013-05-23 LAB — TROPONIN I
Troponin I: <0.3 ng/mL (ref <0.30)
Troponin I: <0.3 ng/mL (ref <0.30)

## 2013-05-23 LAB — LIPID PANEL
HDL: 51 mg/dL (ref >39)
LDL Cholesterol: 65 mg/dL (ref 0–99)
Total CHOL/HDL Ratio: 2.7 RATIO
Triglycerides: 120 mg/dL (ref <150)

## 2013-05-23 LAB — RAPID URINE DRUG SCREEN, HOSP PERFORMED
Amphetamines: NOT DETECTED
Tetrahydrocannabinol: NOT DETECTED

## 2013-05-23 MED ORDER — CLOPIDOGREL BISULFATE 75 MG PO TABS: 75.0000 mg | ORAL_TABLET | Freq: Every day | ORAL | Status: DC

## 2013-05-23 NOTE — Discharge Summary (Signed)
Physician Discharge Summary  [name] JYN:[id number] DOB: 18-May-1931 DOA: 05/22/2013  PCP: Oliver Barre, MD  Admit date: 05/22/2013 Discharge date: 05/23/2013  Time spent: 45 minutes  Recommendations for Outpatient Follow-up:  -Advised to follow up with her PCP in 2 weeks. -ASA has been upgraded to Plavix.   Discharge Diagnoses:  Principal Problem:   TIA (transient ischemic attack) Active Problems:   HYPERLIPIDEMIA   ANEMIA-IRON DEFICIENCY   DEPRESSION   HYPERTENSION   GERD   FATIGUE   WEIGHT LOSS   Type II or unspecified type diabetes mellitus without mention of complication, uncontrolled   History of CVA (cerebrovascular accident)   Dementia   Chest pain   UTI (urinary tract infection)   AKI (acute kidney injury)   Discharge Condition: Stable and improved  Filed Weights   05/22/13 2012  Weight: 62.506 kg (137 lb 12.8 oz)    History of present illness:  [name] is a 79 y.o. female has a past medical history significant for previous TIA, recurrent UTIs, Dementia, HTN, DM, presents with a chief complaint of numbness in her face around her mouth, mainly on the left side, of sudden onset today. She also complains of generalized weakness in her arms and feet for the past couple of days. Today she also experienced chest pain which she describes being in the middle of her chest, without irradiation, and sharp in quality. She called her son when she was feeling numb and he brought her to the ED. She also reports that she had a fall in the past week due to her "legs being weak". She also complains of intermittent palpitations, none today but and and off for some time. Denies LOC. HAs a history of recurrent UTIs and reports dysuria today, but states that it is somewhat chronic. Denies fever/chills, denies shortness of breath, denies abdominal pain, nausea, vomiting or diarrhea. In the ED patient underwent CT scan of the head as well as MRI which was negative for CVA. We  were asked to admit her for further evaluation and management.   Hospital Course:   TIA -MRI/ECHO/Dopplers all WNL. -Will upgrade ASA to Plavix. -LDL 65. -PT/OT recs HH services.  Rest of chronic medical conditions have been stable.  Procedures: ECHO:  Study Conclusions  - Left ventricle: The cavity size was normal. Wall thickness was normal. Systolic function was normal. The estimated ejection fraction was in the range of 55% to 60%. Wall motion was normal; there were no regional wall motion abnormalities. Doppler parameters are consistent with abnormal left ventricular relaxation (grade 1 diastolic dysfunction).  Carotid Dopplers: Carotid Duplex (Doppler) has been completed. Preliminary findings: Bilateral: 1-39% ICA stenosis. Vertebral artery flow is antegrade.     Consultations:  none  Discharge Instructions  Discharge Orders   Future Appointments Provider Department Dept Phone   06/15/2013 1:30 PM Nilda Riggs, NP [facility] [phone]   10/04/2013 10:00 AM Corwin Levins, MD [facility] [phone]   Future Orders Complete By Expires   Diet - low sodium heart healthy  As directed    Discontinue IV  As directed    Increase activity slowly  As directed        Medication List    STOP taking these medications       aspirin 81 MG EC tablet     glucose blood test strip  Commonly known as:  ONE TOUCH ULTRA TEST     Lancets Misc  nitroGLYCERIN 0.4 MG SL tablet  Commonly known as:  NITROSTAT      TAKE these medications       amLODipine 10 MG tablet  Commonly known as:  NORVASC  Take 1 tablet (10 mg total) by mouth daily.     atorvastatin 10 MG tablet  Commonly known as:  LIPITOR  Take 1 tablet (10 mg total) by mouth daily.     clopidogrel 75 MG tablet  Commonly known as:  PLAVIX  Take 1 tablet (75 mg total) by mouth daily.     insulin glargine 100 UNIT/ML injection  Commonly known as:   LANTUS  Inject 0.4 mLs (40 Units total) into the skin daily.     losartan 50 MG tablet  Commonly known as:  COZAAR  Take 1 tablet (50 mg total) by mouth daily.     memantine 5 MG tablet  Commonly known as:  NAMENDA  Take 10 mg by mouth 2 (two) times daily.     metFORMIN 500 MG tablet  Commonly known as:  GLUCOPHAGE  Take 1 tablet (500 mg total) by mouth 2 (two) times daily with a meal.       Allergies  Allergen Reactions  . Codeine Nausea And Vomiting and Rash  . Hydrocodone-Acetaminophen Nausea And Vomiting and Rash  . Oxycodone-Acetaminophen Nausea And Vomiting and Rash       Follow-up Information   Follow up with Oliver Barre, MD. Schedule an appointment as soon as possible for a visit in 2 weeks.   Specialties:  Internal Medicine, Radiology   Contact information:   [address] [place] [address] [phone]        The results of significant diagnostics from this hospitalization (including imaging, microbiology, ancillary and laboratory) are listed below for reference.    Significant Diagnostic Studies: Ct Head Wo Contrast  05/22/2013   *RADIOLOGY REPORT*  Clinical Data: Dizziness.  Left facial numbness.  CT HEAD WITHOUT CONTRAST  Technique:  Contiguous axial images were obtained from the base of the skull through the vertex without contrast.  Comparison: 07/27/2012.  Findings: No intracranial hemorrhage.  Remote right thalamic infarct.  Small vessel disease type changes without CT findings of large acute infarct.  Difficult to exclude very small acute left paracentral pontine infarct as versus artifact.  Polypoid opacification maxillary sinuses greater on the right.  IMPRESSION: No intracranial hemorrhage.  Remote right thalamic infarct.  Small vessel disease type changes without CT findings of large acute infarct.  Difficult to exclude very small acute left paracentral pontine infarct as versus artifact.  Polypoid opacification maxillary sinuses greater on  the right.   Original Report Authenticated By: Lacy Duverney, M.D.   Mr Brain Wo Contrast  05/22/2013   *RADIOLOGY REPORT*  Clinical Data: Dizziness.  Left facial numbness.  Arm weakness. Abnormal CT.  Rule out CVA  MRI HEAD WITHOUT CONTRAST  Technique:  Multiplanar, multiecho pulse sequences of the brain and surrounding structures were obtained according to standard protocol without intravenous contrast.  Comparison: CT 05/22/2013.  MRI 08/02/2012  Findings: Negative for acute infarct.  Generalized mild atrophy.  Chronic lacunar infarctions in the thalami bilaterally.  Chronic lacunar infarction in the left pons corresponds to the CT hypodensity. Mild chronic microvascular ischemic change in the white matter.  Negative for intracranial hemorrhage.  Negative for hemorrhage or mass lesion.  Negative for hydrocephalus.  Mucosal edema in the paranasal sinuses bilaterally.  IMPRESSION: Atrophy and chronic ischemic change.  No acute  abnormality.   Original Report Authenticated By: Janeece Riggers, M.D.   Dg Chest Port 1 View  05/22/2013   *RADIOLOGY REPORT*  Clinical Data: Chest pain.  PORTABLE CHEST - 1 VIEW  Comparison: 07/27/2012  Findings: Upper normal heart size.  Clear lungs.  Proximal left humerus deformity is stable. No pneumothorax or pleural effusion.  IMPRESSION: No active cardiopulmonary disease.   Original Report Authenticated By: Jolaine Click, M.D.    Microbiology: No results found for this or any previous visit (from the past 240 hour(s)).   Labs: Basic Metabolic Panel:  Recent Labs Lab 05/22/13 1640 05/23/13 0348  NA 135 140  K 3.6 3.2*  CL 97 105  CO2 24 27  GLUCOSE 197* 98  BUN 16 14  CREATININE 1.28* 1.16*  CALCIUM 10.3 9.3   Liver Function Tests:  Recent Labs Lab 05/22/13 1640  AST 19  ALT 14  ALKPHOS 66  BILITOT 0.3  PROT 7.5  ALBUMIN 3.6   No results found for this basename: LIPASE, AMYLASE,  in the last 168 hours No results found for this basename: AMMONIA,  in  the last 168 hours CBC:  Recent Labs Lab 05/22/13 1640  WBC 7.4  NEUTROABS 3.5  HGB 10.4*  HCT 31.4*  MCV 86.3  PLT 302   Cardiac Enzymes:  Recent Labs Lab 05/22/13 1640 05/22/13 2320 05/23/13 0348  TROPONINI <0.30 <0.30 <0.30   BNP: BNP (last 3 results)  Recent Labs  05/22/13 1640  PROBNP 173.3   CBG:  Recent Labs Lab 05/22/13 2027 05/23/13 0809 05/23/13 1144  GLUCAP 89 78 77       Signed:  HERNANDEZ ACOSTA,ESTELA  [facility] Pager: [phone] 05/23/2013, 2:16 PM

## 2013-05-23 NOTE — Progress Notes (Signed)
*  PRELIMINARY RESULTS* Vascular Ultrasound Carotid Duplex (Doppler) has been completed.  Preliminary findings: Bilateral:  1-39% ICA stenosis.  Vertebral artery flow is antegrade.      Farrel Demark, RDMS, RVT  05/23/2013, 9:13 AM

## 2013-05-23 NOTE — Progress Notes (Signed)
Pt selected [facility] for [facility] needs. Referral given.

## 2013-05-23 NOTE — Progress Notes (Signed)
Inpatient Diabetes Program Recommendations  AACE/ADA: New Consensus Statement on Inpatient Glycemic Control (2013)  Target Ranges:  Prepandial:   less than 140 mg/dL      Peak postprandial:   less than 180 mg/dL (1-2 hours)      Critically ill patients:  140 - 180 mg/dL   Reason for Visit: Elevated HgbA1C  Pt states her glucose meter is broken and hasn't been able to check blood sugars in the last few weeks. Pt states she takes Lantus 40 units QHS.    Results for Julia, [name] (MRN [id number]) as of 05/23/2013 14:57  Ref. Range 04/06/2013 11:20 05/22/2013 16:40  Hemoglobin A1C Latest Range: <5.7 % 7.4 (H) 10.1 (H)   Results for Julia, [name] (MRN [id number]) as of 05/23/2013 14:57  Ref. Range 05/22/2013 20:27 05/23/2013 08:09 05/23/2013 11:44  Glucose-Capillary Latest Range: 70-99 mg/dL 89 78 77   Blood sugars WNL here in hospital. Recommend decrease Lantus dose for home. To f/u with Dr. Melvyn Novas regarding her diabetes.  Ailene Ards, RD, LDN, CDE Inpatient Diabetes Coordinator [phone]

## 2013-05-23 NOTE — Evaluation (Signed)
Physical Therapy Evaluation Patient Details Name: [name] MRN: [id number] DOB: [birth year] Today's Date: 05/23/2013 Time: 1478-2956 PT Time Calculation (min): 23 min  PT Assessment / Plan / Recommendation History of Present Illness  79 yo female admitted with TIA. Hx of dementia but pt lives alone.   Clinical Impression  On eval, pt required Min assist for mobility-able to ambulate ~150 feet with use of hallway handrail to simulate cane use. Pt is unsteady. Recommended to pt that she use walker (pt already has at home) for ambulation until strength and balance improve-pt agreeable. Pt does live alone-would benefit from increased supervision over next few days to ensure safety in home. Discussed pt staying with son for a few days however pt states she prefers to return to her own home.     PT Assessment  Patient needs continued PT services    Follow Up Recommendations  Home health PT;Supervision for mobility/OOB;Home Health OT    Does the patient have the potential to tolerate intense rehabilitation      Barriers to Discharge        Equipment Recommendations  None recommended by PT    Recommendations for Other Services OT consult   Frequency Min 3X/week    Precautions / Restrictions Precautions Precautions: Fall Restrictions Weight Bearing Restrictions: No   Pertinent Vitals/Pain No c.o pain      Mobility  Bed Mobility Bed Mobility: Supine to Sit Supine to Sit: 5: Supervision;HOB elevated Details for Bed Mobility Assistance: Increased time.  Transfers Transfers: Sit to Stand;Stand to Sit Sit to Stand: 4: Min assist;From bed Stand to Sit: 4: Min guard;To chair/3-in-1 Details for Transfer Assistance: Assist to rise, stabilize.  Ambulation/Gait Ambulation/Gait Assistance: 4: Min assist Ambulation Distance (Feet): 150 Feet Ambulation/Gait Assistance Details: Assist to stabilize throughout ambulation. Pt is unsteady. Reports bil LE weakness. Pt used hallway  handrail for 1 UE support to simulate cane use. slow gait speed.  Gait Pattern: Step-through pattern;Decreased stride length;Decreased step length - right;Decreased step length - left    Exercises     PT Diagnosis: Difficulty walking;Abnormality of gait;Generalized weakness  PT Problem List: Decreased strength;Decreased balance;Decreased activity tolerance;Decreased mobility;Decreased knowledge of use of DME PT Treatment Interventions: DME instruction;Gait training;Functional mobility training;Therapeutic activities;Therapeutic exercise;Patient/family education;Balance training     PT Goals(Current goals can be found in the care plan section) Acute Rehab PT Goals Patient Stated Goal: return home PT Goal Formulation: With patient Time For Goal Achievement: 06/06/13 Potential to Achieve Goals: Good  Visit Information  Last PT Received On: 05/23/13 Assistance Needed: +1 History of Present Illness: 79 yo female admitted with TIA.        Prior Functioning  Home Living Family/patient expects to be discharged to:: Private residence Living Arrangements: Alone Type of Home: Mobile home Home Access: Stairs to enter Entrance Stairs-Number of Steps: 3 Entrance Stairs-Rails: Right;Left Home Layout: One level Home Equipment: Cane - single point Prior Function Level of Independence: Independent with assistive device(s) Comments: uses cane outside of home. nothing inside home Communication Communication: No difficulties    Cognition  Cognition Arousal/Alertness: Awake/[name] Behavior During Therapy: WFL for tasks assessed/performed Overall Cognitive Status: History of cognitive impairments - at baseline (dementia)    Extremity/Trunk Assessment Upper Extremity Assessment Upper Extremity Assessment: Overall WFL for tasks assessed Lower Extremity Assessment Lower Extremity Assessment: Generalized weakness Cervical / Trunk Assessment Cervical / Trunk Assessment: Normal   Balance  Balance Balance Assessed: Yes Static Standing Balance Static Standing - Balance Support: No upper extremity  supported Static Standing - Level of Assistance: 4: Min assist Dynamic Standing Balance Dynamic Standing - Balance Support: No upper extremity supported;Left upper extremity supported Dynamic Standing - Level of Assistance: 4: Min assist  End of Session PT - End of Session Equipment Utilized During Treatment: Gait belt Activity Tolerance: Patient tolerated treatment well Patient left: in chair;with call bell/phone within reach  GP     [name], MPT Pager: [phone]

## 2013-05-23 NOTE — Evaluation (Signed)
Clinical/Bedside Swallow Evaluation Patient Details  Name: [name] MRN: [id number] Date of Birth: October 08, 1930  Today's Date: 05/23/2013 Time: 8657-8469 SLP Time Calculation (min): 33 min  Past Medical History:  Past Medical History  Diagnosis Date  . COLONIC POLYPS 06/15/2005  . DIABETES MELLITUS, TYPE II 04/22/2007  . HYPERCHOLESTEROLEMIA   . HYPERLIPIDEMIA   . OBESITY   . ANEMIA-IRON DEFICIENCY   . DEPRESSION   . HYPERTENSION   . ALLERGIC RHINITIS   . ESOPHAGEAL STRICTURE   . GERD   . HIATAL HERNIA   . DIVERTICULOSIS, COLON   . BACK PAIN   . OSTEOPOROSIS   . DIZZINESS   . WEIGHT LOSS   . DYSPNEA   . Other dysphagia   . COLONIC POLYPS, HX OF 04/03/2002 & 06/15/2005    TUBULAR ADENOMA  . History of CVA (cerebrovascular accident) 07/29/2012    Noted old, to right thalamus and pons by Head CT Jul 27, 2012  . Dementia 09/17/2012  . Type II or unspecified type diabetes mellitus without mention of complication, uncontrolled 07/16/2012   Past Surgical History:  Past Surgical History  Procedure Laterality Date  . Appendectomy    . Abdominal hysterectomy    . Tonsillectomy and adenoidectomy    . Left ankle surgury    . S/p bladder pubovaginal sling    . S/p cystocele/rectocele repair    . Laparoscopic takedown of incarcerated stomach within the chest/nissen  2009  . Esophageal manometry  12/21/2011    Procedure: ESOPHAGEAL MANOMETRY (EM);  Surgeon: Julia Layman, MD;  Location: [facility];  Service: Endoscopy;  Laterality: N/A;   HPI:  79 yo female adm to [facility] with TIA and dementia.  Pt lives alone.  She has h/o hiatal hernia s/p Nissen and speech swallow evaluation was ordered.    Assessment / Plan / Recommendation Clinical Impression  Pt presents with functional oropharyngeal swallow ability based on clinical swallow evaluation, no s/s of aspiration observed with pt self feeding 4 ounces applesauce, 4 crackers with peanut butter and water.  She admits to having  to be careful with meats due to sensation of lodging in pharynx- suspect distal esophagus based on previous esophagram and Nissen.  No overt pocketing on right observed but given motor and sensory deficits impacting facial nerve - risk is present.  Rec regular diet with precautions. No follow up needed as all education is completed and pt given precautions in writing.         Aspiration Risk  Mild    Diet Recommendation Regular;Thin liquid   Liquid Administration via: Straw;Cup Medication Administration: Whole meds with liquid Supervision: Patient able to self feed Compensations: Slow rate;Small sips/bites;Check for pocketing Postural Changes and/or Swallow Maneuvers: Out of bed for meals;Seated upright 90 degrees;Upright 30-60 min after meal    Other  Recommendations   n/a  Follow Up Recommendations  None    Frequency and Duration        Pertinent Vitals/Pain Afebrile,decreased    SLP Swallow Goals  n/a   Swallow Study Prior Functional Status  Type of Home: Mobile home    General HPI: 79 yo female adm to [facility] with TIA and dementia.  Pt lives alone.  She has h/o hiatal hernia s/p Nissen and speech swallow evaluation was ordered.  Type of Study: Bedside swallow evaluation Diet Prior to this Study: NPO Temperature Spikes Noted: No Respiratory Status: Room air Behavior/Cognition: Alert;Cooperative;Pleasant mood Oral Cavity - Dentition: Adequate natural dentition (has partial at home)  Self-Feeding Abilities: Able to feed self Patient Positioning: Upright in chair Baseline Vocal Quality: Clear (occasionally becomes high pitched) Volitional Cough: Strong Volitional Swallow: Able to elicit    Oral/Motor/Sensory Function Facial ROM: Reduced right Facial Strength: Reduced Facial Sensation: Reduced   Ice Chips Ice chips: Not tested   Thin Liquid Thin Liquid: Within functional limits Presentation: Self Fed;Cup;Straw    Nectar Thick Nectar Thick Liquid: Not tested   Honey Thick  Honey Thick Liquid: Not tested   Puree Puree: Within functional limits Presentation: Self Fed;Spoon   Solid   GO    Solid: Within functional limits Presentation: Self Julia Pick, MS [facility] SLP [phone]

## 2013-05-25 LAB — URINE CULTURE: Colony Count: >=100000

## 2013-05-26 ENCOUNTER — Telehealth: Payer: Self-pay

## 2013-05-26 NOTE — Telephone Encounter (Signed)
Pt recently hospd with TIA, so pulm issue appears to be new.  Please consider OV with any MD today, or UC or ER for worsening exertional hypoxemia on o2

## 2013-05-26 NOTE — Telephone Encounter (Signed)
HHRN needs a verbal for speech and PT therapy.  Also to inform today patient had increased SOB since seeing her on Wednesday.  BP 150/70, P 88-90,. RN concerned her Oxygen dropped to 85% when moving around, but resting was 93%, voice coming in and out.  Please advise

## 2013-05-26 NOTE — Telephone Encounter (Signed)
Stated she would inform the family once able to contact.

## 2013-05-26 NOTE — Telephone Encounter (Signed)
Called informed HHRN of MD instructions.

## 2013-05-31 ENCOUNTER — Telehealth: Payer: Self-pay | Admitting: *Deleted

## 2013-05-31 NOTE — Telephone Encounter (Signed)
Please stop  The amlodipine  ROV in 2 days

## 2013-05-31 NOTE — Telephone Encounter (Signed)
Spoke with pt advised of MDs message..  Pt verbalized agreement.  Unable to make appoint at this time will call back on Monday.

## 2013-05-31 NOTE — Telephone Encounter (Signed)
[name] called states pt complains of weakness today.  States pts BP lying down is 120/58; standing 98/50.  Further states pt reports she had not taken medications.  Please advise

## 2013-06-02 ENCOUNTER — Encounter: Payer: Self-pay | Admitting: Internal Medicine

## 2013-06-02 ENCOUNTER — Ambulatory Visit: Payer: Medicare Other | Admitting: Internal Medicine

## 2013-06-02 ENCOUNTER — Ambulatory Visit (INDEPENDENT_AMBULATORY_CARE_PROVIDER_SITE_OTHER): Payer: Medicare Other | Admitting: Internal Medicine

## 2013-06-02 ENCOUNTER — Ambulatory Visit (INDEPENDENT_AMBULATORY_CARE_PROVIDER_SITE_OTHER): Payer: Medicare Other

## 2013-06-02 VITALS — BP 140/70 | HR 80 | Temp 96.9°F | Resp 16 | Wt 142.0 lb

## 2013-06-02 DIAGNOSIS — K920 Hematemesis: Secondary | ICD-10-CM | POA: Insufficient documentation

## 2013-06-02 DIAGNOSIS — R112 Nausea with vomiting, unspecified: Secondary | ICD-10-CM

## 2013-06-02 DIAGNOSIS — R634 Abnormal weight loss: Secondary | ICD-10-CM

## 2013-06-02 DIAGNOSIS — R209 Unspecified disturbances of skin sensation: Secondary | ICD-10-CM

## 2013-06-02 DIAGNOSIS — I10 Essential (primary) hypertension: Secondary | ICD-10-CM

## 2013-06-02 DIAGNOSIS — R202 Paresthesia of skin: Secondary | ICD-10-CM

## 2013-06-02 DIAGNOSIS — F0390 Unspecified dementia without behavioral disturbance: Secondary | ICD-10-CM

## 2013-06-02 LAB — CBC WITH DIFFERENTIAL/PLATELET
Basophils Absolute: 0 10*3/uL (ref 0.0–0.1)
Eosinophils Relative: 2.4 % (ref 0.0–5.0)
HCT: 28.2 % — ABNORMAL LOW (ref 36.0–46.0)
Hemoglobin: 9.5 g/dL — ABNORMAL LOW (ref 12.0–15.0)
Lymphocytes Relative: 19.2 % (ref 12.0–46.0)
Lymphs Abs: 1.1 10*3/uL (ref 0.7–4.0)
Monocytes Relative: 7.7 % (ref 3.0–12.0)
Neutro Abs: 4.2 10*3/uL (ref 1.4–7.7)
WBC: 5.9 10*3/uL (ref 4.5–10.5)

## 2013-06-02 LAB — BASIC METABOLIC PANEL
Calcium: 9.5 mg/dL (ref 8.4–10.5)
GFR: 41.85 mL/min — ABNORMAL LOW (ref >60.00)
Potassium: 3.3 mEq/L — ABNORMAL LOW (ref 3.5–5.1)
Sodium: 134 mEq/L — ABNORMAL LOW (ref 135–145)

## 2013-06-02 LAB — URINALYSIS, ROUTINE W REFLEX MICROSCOPIC
Nitrite: NEGATIVE
Specific Gravity, Urine: <=1.005 (ref 1.000–1.030)
Total Protein, Urine: NEGATIVE
Urobilinogen, UA: 0.2 (ref 0.0–1.0)

## 2013-06-02 LAB — HEPATIC FUNCTION PANEL
ALT: 15 U/L (ref 0–35)
AST: 18 U/L (ref 0–37)
Albumin: 3.6 g/dL (ref 3.5–5.2)
Total Protein: 7.3 g/dL (ref 6.0–8.3)

## 2013-06-02 LAB — PROTIME-INR: Prothrombin Time: 10.8 s (ref 10.2–12.4)

## 2013-06-02 MED ORDER — AMLODIPINE BESYLATE 10 MG PO TABS: 5.0000 mg | ORAL_TABLET | Freq: Every day | ORAL | Status: DC

## 2013-06-02 MED ORDER — CLONAZEPAM 0.25 MG PO TBDP: 0.2500 mg | ORAL_TABLET | Freq: Two times a day (BID) | ORAL | Status: DC | PRN

## 2013-06-02 MED ORDER — ESOMEPRAZOLE MAGNESIUM 20 MG PO CPDR: 20.0000 mg | DELAYED_RELEASE_CAPSULE | Freq: Every day | ORAL | Status: DC

## 2013-06-02 NOTE — Assessment & Plan Note (Addendum)
Hold Lipitor Labs Reduce Metformin

## 2013-06-02 NOTE — Assessment & Plan Note (Addendum)
9/14 poss Mallori[place] tears vs other - i.e. PUD Nexium 24h qd

## 2013-06-02 NOTE — Patient Instructions (Addendum)
Reduce Amlodipine to 5 mg/day Hold Lipitor (Atorvastatin) Reduce Metformin once a day

## 2013-06-02 NOTE — Progress Notes (Signed)
Subjective:    HPI  C/o post-hosp f/u; weakness, anxiety, n/v; black stuff in emesis 2 d ago once. BP drops during PT  She had a BP drop during PT at home  C/o being cold all the time  Recommendations for Outpatient Follow-up:  -Advised to follow up with her PCP in 2 weeks.  -ASA has been upgraded to Plavix.  Discharge Diagnoses:  Principal Problem:  TIA (transient ischemic attack)  Active Problems:  HYPERLIPIDEMIA  ANEMIA-IRON DEFICIENCY  DEPRESSION  HYPERTENSION  GERD  FATIGUE  WEIGHT LOSS  Type II or unspecified type diabetes mellitus without mention of complication, uncontrolled  History of CVA (cerebrovascular accident)  Dementia  Chest pain  UTI (urinary tract infection)  AKI (acute kidney injury)  Discharge Condition: Stable and improved  Filed Weights    05/22/13 2012   Weight:  62.506 kg (137 lb 12.8 oz)   History of present illness:  [name] is a 79 y.o. female has a past medical history significant for previous TIA, recurrent UTIs, Dementia, HTN, DM, presents with a chief complaint of numbness in her face around her mouth, mainly on the left side, of sudden onset today. She also complains of generalized weakness in her arms and feet for the past couple of days. Today she also experienced chest pain which she describes being in the middle of her chest, without irradiation, and sharp in quality. She called her son when she was feeling numb and he brought her to the ED. She also reports that she had a fall in the past week due to her "legs being weak". She also complains of intermittent palpitations, none today but and and off for some time. Denies LOC. HAs a history of recurrent UTIs and reports dysuria today, but states that it is somewhat chronic. Denies fever/chills, denies shortness of breath, denies abdominal pain, nausea, vomiting or diarrhea. In the ED patient underwent CT scan of the head as well as MRI which was negative for CVA. We were asked to  admit her for further evaluation and management.  Hospital Course:  TIA  -MRI/ECHO/Dopplers all WNL.  -Will upgrade ASA to Plavix.  -LDL 65.  -PT/OT recs HH services.  Rest of chronic medical conditions have been stable.  Procedures:  ECHO: Study Conclusions  - Left ventricle: The cavity size was normal. Wall thickness was normal. Systolic function was normal. The estimated ejection fraction was in the range of 55% to 60%. Wall motion was normal; there were no regional wall motion abnormalities. Doppler parameters are consistent with abnormal left ventricular relaxation (grade 1 diastolic dysfunction).  Carotid Dopplers: Carotid Duplex (Doppler) has been completed. Preliminary findings: Bilateral: 1-39% ICA stenosis. Vertebral artery flow is antegrade.  Consultations:  none    Review of Systems  Constitutional: Positive for fatigue. Negative for chills, activity change, appetite change and unexpected weight change.  HENT: Positive for voice change. Negative for congestion, mouth sores and sinus pressure.   Eyes: Negative for photophobia, redness and visual disturbance.  Respiratory: Positive for shortness of breath. Negative for cough, chest tightness and wheezing.   Cardiovascular: Negative for palpitations and leg swelling.  Gastrointestinal: Positive for nausea and vomiting. Negative for abdominal pain and abdominal distention.  Endocrine: Negative for polyuria.  Genitourinary: Negative for frequency, difficulty urinating and vaginal pain.  Musculoskeletal: Positive for back pain. Negative for myalgias and gait problem.  Skin: Positive for rash. Negative for pallor.  Neurological: Positive for weakness and light-headedness. Negative for dizziness, tremors, numbness and  headaches.  Psychiatric/Behavioral: Negative for confusion and sleep disturbance.       Objective:   Physical Exam  Constitutional: She appears well-developed. No distress.  Obese In a w/c  HENT:   Head: Normocephalic.  Right Ear: External ear normal.  Left Ear: External ear normal.  Nose: Nose normal.  Mouth/Throat: Oropharyngeal exudate present.  Not dry  Eyes: Conjunctivae are normal. Pupils are equal, round, and reactive to light. Right eye exhibits no discharge. Left eye exhibits no discharge.  Neck: Normal range of motion. Neck supple. No JVD present. No tracheal deviation present. No thyromegaly present.  Cardiovascular: Normal rate, regular rhythm and normal heart sounds.   No murmur heard. Pulmonary/Chest: No stridor. No respiratory distress. She has no wheezes. She has no rales.  Abdominal: Soft. Bowel sounds are normal. She exhibits no distension and no mass. There is no tenderness. There is no rebound and no guarding.  Musculoskeletal: She exhibits edema (trace). She exhibits no tenderness.  Lymphadenopathy:    She has no cervical adenopathy.  Neurological: She displays normal reflexes. No cranial nerve deficit. She exhibits normal muscle tone. Coordination abnormal.  Skin: Rash noted. She is not diaphoretic. No erythema.  B shins petechiae  Psychiatric: She has a normal mood and affect. Her behavior is normal. Thought content normal.     Lab Results  Component Value Date   WBC 7.4 05/22/2013   HGB 10.4* 05/22/2013   HCT 31.4* 05/22/2013   PLT 302 05/22/2013   GLUCOSE 98 05/23/2013   CHOL 140 05/23/2013   TRIG 120 05/23/2013   HDL 51 05/23/2013   LDLDIRECT 158.0 04/06/2013   LDLCALC 65 05/23/2013   ALT 14 05/22/2013   AST 19 05/22/2013   NA 140 05/23/2013   K 3.2* 05/23/2013   CL 105 05/23/2013   CREATININE 1.16* 05/23/2013   BUN 14 05/23/2013   CO2 27 05/23/2013   TSH 2.04 04/06/2013   INR 0.92 05/18/2012   HGBA1C 10.1* 05/22/2013   MICROALBUR 24.1* 04/06/2013    A complex case    Assessment & Plan:

## 2013-06-03 NOTE — Assessment & Plan Note (Signed)
Continue with current prescription therapy as reflected on the Med list.  

## 2013-06-03 NOTE — Assessment & Plan Note (Signed)
Wt Readings from Last 3 Encounters:  06/02/13 142 lb (64.411 kg)  05/22/13 137 lb 12.8 oz (62.506 kg)  04/06/13 144 lb 4 oz (65.431 kg)

## 2013-06-05 ENCOUNTER — Other Ambulatory Visit: Payer: Self-pay | Admitting: Internal Medicine

## 2013-06-05 DIAGNOSIS — F0390 Unspecified dementia without behavioral disturbance: Secondary | ICD-10-CM

## 2013-06-05 DIAGNOSIS — E1149 Type 2 diabetes mellitus with other diabetic neurological complication: Secondary | ICD-10-CM

## 2013-06-05 DIAGNOSIS — E1142 Type 2 diabetes mellitus with diabetic polyneuropathy: Secondary | ICD-10-CM

## 2013-06-05 DIAGNOSIS — IMO0001 Reserved for inherently not codable concepts without codable children: Secondary | ICD-10-CM

## 2013-06-05 LAB — VITAMIN B12: Vitamin B-12: 531 pg/mL (ref 211–911)

## 2013-06-05 LAB — SEDIMENTATION RATE: Sed Rate: 78 mm/hr — ABNORMAL HIGH (ref 0–22)

## 2013-06-05 LAB — TSH: TSH: 1.83 u[IU]/mL (ref 0.35–5.50)

## 2013-06-05 MED ORDER — POTASSIUM CHLORIDE ER 8 MEQ PO TBCR: 8.0000 meq | EXTENDED_RELEASE_TABLET | Freq: Every day | ORAL | Status: DC

## 2013-06-05 MED ORDER — CIPROFLOXACIN HCL 250 MG PO TABS: 250.0000 mg | ORAL_TABLET | Freq: Two times a day (BID) | ORAL | Status: DC

## 2013-06-09 ENCOUNTER — Telehealth: Payer: Self-pay | Admitting: *Deleted

## 2013-06-09 NOTE — Telephone Encounter (Signed)
PT informed of ok per MD

## 2013-06-09 NOTE — Telephone Encounter (Signed)
Ok for verbal 

## 2013-06-09 NOTE — Telephone Encounter (Signed)
[name] called requesting additional 3 weeks of [name] for balance and mobility.  Please advise

## 2013-06-15 ENCOUNTER — Encounter: Payer: Self-pay | Admitting: Nurse Practitioner

## 2013-06-15 ENCOUNTER — Ambulatory Visit (INDEPENDENT_AMBULATORY_CARE_PROVIDER_SITE_OTHER): Payer: Medicare Other | Admitting: Nurse Practitioner

## 2013-06-15 VITALS — BP 139/71 | HR 93 | Ht 59.75 in | Wt 144.0 lb

## 2013-06-15 DIAGNOSIS — F0390 Unspecified dementia without behavioral disturbance: Secondary | ICD-10-CM

## 2013-06-15 DIAGNOSIS — R269 Unspecified abnormalities of gait and mobility: Secondary | ICD-10-CM

## 2013-06-15 MED ORDER — MEMANTINE HCL ER 28 MG PO CP24: 28.0000 mg | ORAL_CAPSULE | Freq: Every day | ORAL | Status: DC

## 2013-06-15 NOTE — Progress Notes (Signed)
[facility]  PATIENT: [name] DOB: 1931/08/12   REASON FOR VISIT: Memory loss   HISTORY OF PRESENT ILLNESS: Ms. Kinch, 79 year old female returns for followup .She has past medical history of diabetes, insulin-dependent for 30 years, presenting with memory loss for 4 years. She has failed Aricept in the past having side effects. She is currently on Namenda 10 mg twice daily. She recently moved into her daughter's home and her living situation is much improved in terms of regular eating, taking meds correctly etc. She is happy for this switch  as well. Due to backorder issues, the Namenda was not taken for one month with increased paranoia noted during that time. This is much improved since she is back on the medication.  HISTORY:She had  79 yo of education, stayed home all her life, has 3 children, her husband died 4 years ago, she lives in in her house of more than 40 years, family check on her frequently, since her husband passed away 3 years ago, she was noticed to have confusion episode, she blame people coming to her house, rearrange things, take away her clothes, and put it back again,  Her son manages her finance, she quit driving many years ago, she able to manage her housechore, she denies visual or auditory hallucinations. has intermittent dysphonia, seen by Dr. Jenne Pane, sob with exertion.  I have reviewed MRI of the brain with patient, there was extensive atrophy,mild to moderate periventricular white matter disease She also has diabetic peripheral neuropathy, complains of many years a history of bilateral feet paresthesia, wide-based unsteady gait  07/08/12: Returns for followup. Just completed PT, encouraged to do home exercise program. Pt claims the Aricept is making her nauseated. She is also having hallucinations. Explained the  MRI scan of the lumbar spine  shows severe spondylitic and scoliotic changes  most severe at L 3-4  and L 4-5 where there is  biformainal stenosis with probable compression of left L 3 and L4 roots. Pt has not fallen.Pt continues to live alone.      REVIEW OF SYSTEMS: Full 14 system review of systems performed and notable only for:  Constitutional: N/A  Cardiovascular: N/A  Ear/Nose/Throat: Hearing loss Skin: N/A  Eyes: Blurred vision  Respiratory: N/A  Gastroitestinal: N/A  Hematology/Lymphatic: Easy bruising  Endocrine: N/A Musculoskeletal:N/A  Allergy/Immunology: N/A  Neurological: Memory loss Psychiatric: Anxiety   ALLERGIES: Allergies  Allergen Reactions  . Keflex [Cephalexin]   . Codeine Nausea And Vomiting and Rash  . Hydrocodone-Acetaminophen Nausea And Vomiting and Rash  . Oxycodone-Acetaminophen Nausea And Vomiting and Rash    HOME MEDICATIONS: Outpatient Prescriptions Prior to Visit  Medication Sig Dispense Refill  . amLODipine (NORVASC) 10 MG tablet Take 0.5 tablets (5 mg total) by mouth daily.  90 tablet  3  . ciprofloxacin (CIPRO) 250 MG tablet Take 1 tablet (250 mg total) by mouth 2 (two) times daily.  20 tablet  0  . clonazePAM (KLONOPIN) 0.25 MG disintegrating tablet Take 1 tablet (0.25 mg total) by mouth 2 (two) times daily as needed.  60 tablet  1  . clopidogrel (PLAVIX) 75 MG tablet Take 1 tablet (75 mg total) by mouth daily.  30 tablet  2  . esomeprazole (NEXIUM 24HR) 20 MG capsule Take 1 capsule (20 mg total) by mouth daily before breakfast.  30 capsule  2  . insulin glargine (LANTUS) 100 UNIT/ML injection Inject 0.4 mLs (40 Units total) into the skin daily.  10 mL  11  .  losartan (COZAAR) 50 MG tablet Take 1 tablet (50 mg total) by mouth daily.  90 tablet  3  . memantine (NAMENDA) 5 MG tablet Take 10 mg by mouth 2 (two) times daily.      . metFORMIN (GLUCOPHAGE) 500 MG tablet Take 1 tablet (500 mg total) by mouth 2 (two) times daily with a meal.  180 tablet  3  . potassium chloride (KLOR-CON) 8 MEQ tablet Take 1 tablet (8 mEq total) by mouth daily.  30 tablet  1  .  atorvastatin (LIPITOR) 10 MG tablet Take 1 tablet (10 mg total) by mouth daily.  90 tablet  3   No facility-administered medications prior to visit.    PAST MEDICAL HISTORY: Past Medical History  Diagnosis Date  . COLONIC POLYPS 06/15/2005  . DIABETES MELLITUS, TYPE II 04/22/2007  . HYPERCHOLESTEROLEMIA   . HYPERLIPIDEMIA   . OBESITY   . ANEMIA-IRON DEFICIENCY   . DEPRESSION   . HYPERTENSION   . ALLERGIC RHINITIS   . ESOPHAGEAL STRICTURE   . GERD   . HIATAL HERNIA   . DIVERTICULOSIS, COLON   . BACK PAIN   . OSTEOPOROSIS   . DIZZINESS   . WEIGHT LOSS   . DYSPNEA   . Other dysphagia   . COLONIC POLYPS, HX OF 04/03/2002 & 06/15/2005    TUBULAR ADENOMA  . History of CVA (cerebrovascular accident) 07/29/2012    Noted old, to right thalamus and pons by Head CT Jul 27, 2012  . Dementia 09/17/2012  . Type II or unspecified type diabetes mellitus without mention of complication, uncontrolled 07/16/2012    PAST SURGICAL HISTORY: Past Surgical History  Procedure Laterality Date  . Appendectomy    . Abdominal hysterectomy    . Tonsillectomy and adenoidectomy    . Left ankle surgury    . S/p bladder pubovaginal sling    . S/p cystocele/rectocele repair    . Laparoscopic takedown of incarcerated stomach within the chest/nissen  2009  . Esophageal manometry  12/21/2011    Procedure: ESOPHAGEAL MANOMETRY (EM);  Surgeon: Mardella Layman, MD;  Location: [facility];  Service: Endoscopy;  Laterality: N/A;    FAMILY HISTORY: Family History  Problem Relation Age of Onset  . Heart disease Mother 70  . Diabetes Mother   . Heart attack Mother   . Colon cancer Father   . Diabetes Father     SOCIAL HISTORY: History   Social History  . Marital Status: Widowed    Spouse Name: N/A    Number of Children: 2  . Years of Education: N/A   Occupational History  . retired    Social History Main Topics  . Smoking status: Never Smoker   . Smokeless tobacco: Never Used  . Alcohol Use:  No  . Drug Use: No  . Sexual Activity: Not on file   Other Topics Concern  . Not on file   Social History Narrative   Husband of  64 years died 2009-01-11.     PHYSICAL EXAM  Filed Vitals:   06/15/13 1333  BP: 139/71  Pulse: 93  Height: 4' 11.75" (1.518 m)  Weight: 144 lb (65.318 kg)   Body mass index is 28.35 kg/(m^2).  Generalized: Well developed, in no acute distress   Neurological examination   Mentation: Alert  MMSE 25/30. AFT 11.  Follows all commands speech and language fluent  Cranial nerve II-XII: Pupils were equal round reactive to light extraocular movements were full, visual field were  full on confrontational test. Facial sensation and strength were normal. hearing was intact to finger rubbing bilaterally. Uvula tongue midline. head turning and shoulder shrug and were normal and symmetric.Tongue protrusion into cheek strength was normal. Motor: Moderate bilateral ankle dorsiflexion weakness  Sensory: Length dependent decreased light touch, pinprick to the knee level, decreased vibratory to knee level  Coordination: finger-nose-finger, heel-to-shin bilaterally, no dysmetria Reflexes: Brachioradialis 2/2, biceps 2/2, triceps 2/2, patellar 2/2, Achilles absent plantar responses were flexor bilaterally. Gait and Station: Rising up from seated position without assistance, wide based unsteady cautious gait with single-point cane   DIAGNOSTIC DATA (LABS, IMAGING, TESTING) - I reviewed patient records, labs, notes, testing and imaging myself where available.  Lab Results  Component Value Date   WBC 5.9 06/02/2013   HGB 9.5* 06/02/2013   HCT 28.2* 06/02/2013   MCV 86.6 06/02/2013   PLT 257.0 06/02/2013      Component Value Date/Time   NA 134* 06/02/2013 1659   K 3.3* 06/02/2013 1659   CL 96 06/02/2013 1659   CO2 29 06/02/2013 1659   GLUCOSE 398* 06/02/2013 1659   BUN 19 06/02/2013 1659   CREATININE 1.3* 06/02/2013 1659   CALCIUM 9.5 06/02/2013 1659   PROT 7.3 06/02/2013  1659   ALBUMIN 3.6 06/02/2013 1659   AST 18 06/02/2013 1659   ALT 15 06/02/2013 1659   ALKPHOS 56 06/02/2013 1659   BILITOT 0.6 06/02/2013 1659   GFRNONAA 43* 05/23/2013 0348   GFRAA 50* 05/23/2013 0348   Lab Results  Component Value Date   CHOL 140 05/23/2013   HDL 51 05/23/2013   LDLCALC 65 05/23/2013   LDLDIRECT 158.0 04/06/2013   TRIG 120 05/23/2013   CHOLHDL 2.7 05/23/2013   Lab Results  Component Value Date   HGBA1C 9.6* 06/02/2013   Lab Results  Component Value Date   VITAMINB12 531 06/02/2013   Lab Results  Component Value Date   TSH 1.83 06/02/2013      ASSESSMENT AND PLAN  79 y.o. year old female  has a past medical history insulin-dependent diabetes for greater than 30 years, diabetic peripheral neuropathy, and 4 year history of dementia. Namenda 28 mgXR voucher for free month given F/U 6 months Check into adult [facility] for [facility]  Nilda Riggs, [facility], [facility], APRN  [facility] [address] [place], [place] [address] [phone]

## 2013-06-15 NOTE — Patient Instructions (Signed)
Namenda 28 mgXR voucher for free month given F/U 6 months

## 2013-06-20 ENCOUNTER — Other Ambulatory Visit: Payer: Self-pay

## 2013-06-20 MED ORDER — MEMANTINE HCL 10 MG PO TABS: 10.0000 mg | ORAL_TABLET | Freq: Two times a day (BID) | ORAL | Status: DC

## 2013-06-20 NOTE — Telephone Encounter (Signed)
[name] called, left message saying the pharmacy is not able to get Namenda XR at this time and they need a substitute med sent.  Says they are just about to finish the regular Namenda Starter pack that was given (containing 5mg  and 10mg  tabs).  Namenda XR is still on back order from manufacturer.  Sent a Rx for Namenda regular release 10mg  BID so patient is not without meds while waiting for XR form to become available.

## 2013-07-27 ENCOUNTER — Encounter: Payer: Self-pay | Admitting: Internal Medicine

## 2013-07-27 ENCOUNTER — Ambulatory Visit (INDEPENDENT_AMBULATORY_CARE_PROVIDER_SITE_OTHER): Payer: Medicare Other | Admitting: Internal Medicine

## 2013-07-27 ENCOUNTER — Other Ambulatory Visit (INDEPENDENT_AMBULATORY_CARE_PROVIDER_SITE_OTHER): Payer: Medicare Other

## 2013-07-27 VITALS — BP 162/80 | HR 80 | Temp 99.1°F | Ht 63.0 in | Wt 145.5 lb

## 2013-07-27 DIAGNOSIS — M549 Dorsalgia, unspecified: Secondary | ICD-10-CM

## 2013-07-27 DIAGNOSIS — R350 Frequency of micturition: Secondary | ICD-10-CM

## 2013-07-27 DIAGNOSIS — I10 Essential (primary) hypertension: Secondary | ICD-10-CM

## 2013-07-27 DIAGNOSIS — Z23 Encounter for immunization: Secondary | ICD-10-CM

## 2013-07-27 DIAGNOSIS — F411 Generalized anxiety disorder: Secondary | ICD-10-CM

## 2013-07-27 LAB — URINALYSIS, ROUTINE W REFLEX MICROSCOPIC
Nitrite: NEGATIVE
Specific Gravity, Urine: 1.015 (ref 1.000–1.030)
Urobilinogen, UA: 0.2 (ref 0.0–1.0)

## 2013-07-27 LAB — POCT URINALYSIS DIPSTICK
Bilirubin, UA: NEGATIVE
Glucose, UA: NEGATIVE
Leukocytes, UA: NEGATIVE
Nitrite, UA: NEGATIVE

## 2013-07-27 MED ORDER — CLONAZEPAM 0.25 MG PO TBDP: 0.2500 mg | ORAL_TABLET | Freq: Two times a day (BID) | ORAL | Status: DC | PRN

## 2013-07-27 MED ORDER — AMLODIPINE BESYLATE 10 MG PO TABS: 10.0000 mg | ORAL_TABLET | Freq: Every day | ORAL | Status: DC

## 2013-07-27 NOTE — Patient Instructions (Signed)
You had the new Prevnar pneumonia shot today OK to increase the amlodipine to 10 mg per day Your specimen will be sent to the lab for evaluation, and you should be called if any infection is found Please continue all other medications as before, and refills have been done if requested. Please have the pharmacy call with any other refills you may need. OK to use Benadryl cream OTC for itchy spots

## 2013-07-27 NOTE — Progress Notes (Signed)
Subjective:    Patient ID: [name], female    DOB: Mar 15, 1931, 79 y.o.   MRN: [id number]  HPI Her to f/u with family, no recent falls, stronger overall, off the walker, uses cane outside the house.  Denies worsening depressive symptoms, suicidal ideation, or panic; has ongoing anxiety, not increased recently.  Pt denies polydipsia, polyuria, or low sugar symptoms such as weakness or confusion improved with po intake.  Family states overall good compliance with meds, trying to follow lower cholesterol, diabetic diet, wt overall stable.  Denies urinary symptoms such as dysuria, frequency, urgency, flank pain, hematuria or n/v, fever, chills, family asks for f/u urine studies. Past Medical History  Diagnosis Date  . COLONIC POLYPS 06/15/2005  . DIABETES MELLITUS, TYPE II 04/22/2007  . HYPERCHOLESTEROLEMIA   . HYPERLIPIDEMIA   . OBESITY   . ANEMIA-IRON DEFICIENCY   . DEPRESSION   . HYPERTENSION   . ALLERGIC RHINITIS   . ESOPHAGEAL STRICTURE   . GERD   . HIATAL HERNIA   . DIVERTICULOSIS, COLON   . BACK PAIN   . OSTEOPOROSIS   . DIZZINESS   . WEIGHT LOSS   . DYSPNEA   . Other dysphagia   . COLONIC POLYPS, HX OF 04/03/2002 & 06/15/2005    TUBULAR ADENOMA  . History of CVA (cerebrovascular accident) 07/29/2012    Noted old, to right thalamus and pons by Head CT Jul 27, 2012  . Dementia 09/17/2012  . Type II or unspecified type diabetes mellitus without mention of complication, uncontrolled 07/16/2012   Past Surgical History  Procedure Laterality Date  . Appendectomy    . Abdominal hysterectomy    . Tonsillectomy and adenoidectomy    . Left ankle surgury    . S/p bladder pubovaginal sling    . S/p cystocele/rectocele repair    . Laparoscopic takedown of incarcerated stomach within the chest/nissen  2009  . Esophageal manometry  12/21/2011    Procedure: ESOPHAGEAL MANOMETRY (EM);  Surgeon: Mardella Layman, MD;  Location: [facility];  Service: Endoscopy;  Laterality: N/A;     reports that she has never smoked. She has never used smokeless tobacco. She reports that she does not drink alcohol or use illicit drugs. family history includes Colon cancer in her father; Diabetes in her father and mother; Heart attack in her mother; Heart disease (age of onset: 75) in her mother. Allergies  Allergen Reactions  . Keflex [Cephalexin]   . Codeine Nausea And Vomiting and Rash  . Hydrocodone-Acetaminophen Nausea And Vomiting and Rash  . Oxycodone-Acetaminophen Nausea And Vomiting and Rash   Current Outpatient Prescriptions on File Prior to Visit  Medication Sig Dispense Refill  . atorvastatin (LIPITOR) 10 MG tablet Take 1 tablet (10 mg total) by mouth daily.  90 tablet  3  . clopidogrel (PLAVIX) 75 MG tablet Take 1 tablet (75 mg total) by mouth daily.  30 tablet  2  . esomeprazole (NEXIUM 24HR) 20 MG capsule Take 1 capsule (20 mg total) by mouth daily before breakfast.  30 capsule  2  . insulin glargine (LANTUS) 100 UNIT/ML injection Inject 0.4 mLs (40 Units total) into the skin daily.  10 mL  11  . losartan (COZAAR) 50 MG tablet Take 1 tablet (50 mg total) by mouth daily.  90 tablet  3  . memantine (NAMENDA) 10 MG tablet Take 1 tablet (10 mg total) by mouth 2 (two) times daily.  60 tablet  1  . Memantine HCl ER (NAMENDA XR)  28 MG CP24 Take 28 mg by mouth daily.  30 capsule  0  . metFORMIN (GLUCOPHAGE) 500 MG tablet Take 1 tablet (500 mg total) by mouth 2 (two) times daily with a meal.  180 tablet  3  . potassium chloride (KLOR-CON) 8 MEQ tablet Take 1 tablet (8 mEq total) by mouth daily.  30 tablet  1   No current facility-administered medications on file prior to visit.   Review of Systems  Constitutional: Negative for unexpected weight change, or unusual diaphoresis  HENT: Negative for tinnitus.   Eyes: Negative for photophobia and visual disturbance.  Respiratory: Negative for choking and stridor.   Gastrointestinal: Negative for vomiting and blood in stool.   Genitourinary: Negative for hematuria and decreased urine volume.  Musculoskeletal: Negative for acute joint swelling Skin: Negative for color change and wound.  Neurological: Negative for tremors and numbness other than noted  Psychiatric/Behavioral: Negative for decreased concentration or  hyperactivity.       Objective:   Physical Exam BP 162/80  Pulse 80  Temp(Src) 99.1 F (37.3 C) (Oral)  Ht 5\' 3"  (1.6 m)  Wt 145 lb 8 oz (65.998 kg)  BMI 25.78 kg/m2  SpO2 96% VS noted,  Constitutional: Pt appears well-developed and well-nourished.  HENT: Head: NCAT.  Right Ear: External ear normal.  Left Ear: External ear normal.  Eyes: Conjunctivae and EOM are normal. Pupils are equal, round, and reactive to light.  Neck: Normal range of motion. Neck supple.  Cardiovascular: Normal rate and regular rhythm.   Pulmonary/Chest: Effort normal and breath sounds normal.  Abd:  Soft, NT, non-distended, + BS, no flank tender Neurological: Pt is alert. At baseline confused  Skin: Skin is warm. No erythema.  Psychiatric: Pt behavior is normal. esophageal nervous     Assessment & Plan:

## 2013-07-27 NOTE — Progress Notes (Signed)
Pre-visit discussion using our clinic review tool. No additional management support is needed unless otherwise documented below in the visit note.  

## 2013-07-28 LAB — URINE CULTURE: Organism ID, Bacteria: NO GROWTH

## 2013-07-30 DIAGNOSIS — F411 Generalized anxiety disorder: Secondary | ICD-10-CM | POA: Insufficient documentation

## 2013-07-30 NOTE — Assessment & Plan Note (Signed)
elev today on less amlod 5 qd, otherwise stable overall by history and exam, recent data reviewed with pt, and pt to increase the amlod to 10 qd,  to f/u any worsening symptoms or concerns BP Readings from Last 3 Encounters:  07/27/13 162/80  06/15/13 139/71  06/02/13 140/70

## 2013-07-30 NOTE — Assessment & Plan Note (Signed)
stable overall by history and exam, recent data reviewed with pt, and pt to continue medical treatment as before,  to f/u any worsening symptoms or concerns, for lab next visit

## 2013-07-30 NOTE — Assessment & Plan Note (Signed)
For fu urine studies,  to f/u any worsening symptoms or concerns

## 2013-07-30 NOTE — Assessment & Plan Note (Addendum)
Improved, to f/u any worsening symptoms or concerns  Note:  Total time for pt hx, exam, review of record with pt in the room, determination of diagnoses and plan for further eval and tx is > 40 min, with over 50% spent in coordination and counseling of patient

## 2013-08-01 ENCOUNTER — Other Ambulatory Visit: Payer: Self-pay | Admitting: Internal Medicine

## 2013-08-22 ENCOUNTER — Telehealth: Payer: Self-pay | Admitting: Nurse Practitioner

## 2013-08-22 ENCOUNTER — Other Ambulatory Visit: Payer: Self-pay | Admitting: Nurse Practitioner

## 2013-08-22 MED ORDER — MEMANTINE HCL ER 28 MG PO CP24: 28.0000 mg | ORAL_CAPSULE | Freq: Every day | ORAL | Status: DC

## 2013-08-22 NOTE — Addendum Note (Signed)
Addended by: Beverely Low on: 08/22/2013 03:21 PM   Modules accepted: Orders

## 2013-08-22 NOTE — Telephone Encounter (Signed)
TC to son to discuss his Mom. Sounds like she may be having some hallucinations, he is not keen on putting her on another med. She has refused any day programs. He claims the Klonopin ordered by PCP is helping her anxiety. She has more good days than bad. She is due for follow up appt  soon and we wil discuss again at that time. Needs refills on Namenda. Will renew

## 2013-08-22 NOTE — Telephone Encounter (Signed)
Calling because mother is very upset and crying, she is accusing him of taking her dog that she doesn't have anymore. They have given her meds and tried working her puzzles and none of this is working. They don't know what else to do with her please call

## 2013-10-04 ENCOUNTER — Encounter: Payer: Self-pay | Admitting: Internal Medicine

## 2013-10-04 ENCOUNTER — Other Ambulatory Visit: Payer: Self-pay | Admitting: Internal Medicine

## 2013-10-04 ENCOUNTER — Other Ambulatory Visit (INDEPENDENT_AMBULATORY_CARE_PROVIDER_SITE_OTHER): Payer: Medicare Other

## 2013-10-04 ENCOUNTER — Ambulatory Visit (INDEPENDENT_AMBULATORY_CARE_PROVIDER_SITE_OTHER): Payer: Medicare Other | Admitting: Internal Medicine

## 2013-10-04 ENCOUNTER — Telehealth: Payer: Self-pay | Admitting: Internal Medicine

## 2013-10-04 VITALS — BP 130/82 | HR 90 | Temp 98.2°F | Wt 144.0 lb

## 2013-10-04 DIAGNOSIS — D649 Anemia, unspecified: Secondary | ICD-10-CM

## 2013-10-04 DIAGNOSIS — E1165 Type 2 diabetes mellitus with hyperglycemia: Principal | ICD-10-CM

## 2013-10-04 DIAGNOSIS — IMO0002 Reserved for concepts with insufficient information to code with codable children: Secondary | ICD-10-CM

## 2013-10-04 DIAGNOSIS — E1142 Type 2 diabetes mellitus with diabetic polyneuropathy: Secondary | ICD-10-CM

## 2013-10-04 DIAGNOSIS — E1140 Type 2 diabetes mellitus with diabetic neuropathy, unspecified: Secondary | ICD-10-CM

## 2013-10-04 DIAGNOSIS — F329 Major depressive disorder, single episode, unspecified: Secondary | ICD-10-CM

## 2013-10-04 DIAGNOSIS — E785 Hyperlipidemia, unspecified: Secondary | ICD-10-CM

## 2013-10-04 DIAGNOSIS — F3289 Other specified depressive episodes: Secondary | ICD-10-CM

## 2013-10-04 DIAGNOSIS — E1149 Type 2 diabetes mellitus with other diabetic neurological complication: Secondary | ICD-10-CM

## 2013-10-04 DIAGNOSIS — I10 Essential (primary) hypertension: Secondary | ICD-10-CM

## 2013-10-04 LAB — URINALYSIS, ROUTINE W REFLEX MICROSCOPIC
BILIRUBIN URINE: NEGATIVE
Hgb urine dipstick: NEGATIVE
Ketones, ur: NEGATIVE
NITRITE: POSITIVE — AB
PH: 6.5 (ref 5.0–8.0)
Specific Gravity, Urine: <=1.005 — AB (ref 1.000–1.030)
TOTAL PROTEIN, URINE-UPE24: NEGATIVE
URINE GLUCOSE: NEGATIVE
Urobilinogen, UA: 0.2 (ref 0.0–1.0)

## 2013-10-04 LAB — BASIC METABOLIC PANEL
BUN: 22 mg/dL (ref 6–23)
CO2: 27 mEq/L (ref 19–32)
Calcium: 9.6 mg/dL (ref 8.4–10.5)
Chloride: 102 mEq/L (ref 96–112)
Creatinine, Ser: 1.2 mg/dL (ref 0.4–1.2)
GFR: 44.99 mL/min — AB (ref >60.00)
Glucose, Bld: 42 mg/dL — CL (ref 70–99)
POTASSIUM: 4 meq/L (ref 3.5–5.1)
Sodium: 137 mEq/L (ref 135–145)

## 2013-10-04 LAB — CBC WITH DIFFERENTIAL/PLATELET
BASOS PCT: 0.4 % (ref 0.0–3.0)
Basophils Absolute: 0.1 10*3/uL (ref 0.0–0.1)
EOS PCT: 1.9 % (ref 0.0–5.0)
Eosinophils Absolute: 0.2 10*3/uL (ref 0.0–0.7)
HEMATOCRIT: 28.9 % — AB (ref 36.0–46.0)
Hemoglobin: 9.4 g/dL — ABNORMAL LOW (ref 12.0–15.0)
Lymphocytes Relative: 17.3 % (ref 12.0–46.0)
Lymphs Abs: 2.1 10*3/uL (ref 0.7–4.0)
MCHC: 32.4 g/dL (ref 30.0–36.0)
MCV: 89 fl (ref 78.0–100.0)
MONO ABS: 0.8 10*3/uL (ref 0.1–1.0)
MONOS PCT: 6.7 % (ref 3.0–12.0)
Neutro Abs: 8.9 10*3/uL — ABNORMAL HIGH (ref 1.4–7.7)
Neutrophils Relative %: 73.7 % (ref 43.0–77.0)
PLATELETS: 334 10*3/uL (ref 150.0–400.0)
RBC: 3.24 Mil/uL — AB (ref 3.87–5.11)
RDW: 14.8 % — ABNORMAL HIGH (ref 11.5–14.6)
WBC: 12 10*3/uL — AB (ref 4.5–10.5)

## 2013-10-04 LAB — LIPID PANEL
CHOLESTEROL: 158 mg/dL (ref 0–200)
HDL: 72.7 mg/dL (ref >39.00)
LDL Cholesterol: 68 mg/dL (ref 0–99)
Total CHOL/HDL Ratio: 2
Triglycerides: 88 mg/dL (ref 0.0–149.0)
VLDL: 17.6 mg/dL (ref 0.0–40.0)

## 2013-10-04 LAB — MICROALBUMIN / CREATININE URINE RATIO
Creatinine,U: 23.8 mg/dL
MICROALB UR: 5.5 mg/dL — AB (ref 0.0–1.9)
Microalb Creat Ratio: 23.2 mg/g (ref 0.0–30.0)

## 2013-10-04 LAB — HEPATIC FUNCTION PANEL
ALBUMIN: 3.8 g/dL (ref 3.5–5.2)
ALT: 19 U/L (ref 0–35)
AST: 23 U/L (ref 0–37)
Alkaline Phosphatase: 57 U/L (ref 39–117)
Bilirubin, Direct: 0 mg/dL (ref 0.0–0.3)
Total Bilirubin: 0.4 mg/dL (ref 0.3–1.2)
Total Protein: 7.9 g/dL (ref 6.0–8.3)

## 2013-10-04 LAB — HEMOGLOBIN A1C: Hgb A1c MFr Bld: 7.4 % — ABNORMAL HIGH (ref 4.6–6.5)

## 2013-10-04 LAB — SEDIMENTATION RATE: Sed Rate: 67 mm/hr — ABNORMAL HIGH (ref 0–22)

## 2013-10-04 LAB — IBC PANEL
IRON: 52 ug/dL (ref 42–145)
Saturation Ratios: 14.5 % — ABNORMAL LOW (ref 20.0–50.0)
Transferrin: 255.9 mg/dL (ref 212.0–360.0)

## 2013-10-04 MED ORDER — CLONAZEPAM 0.25 MG PO TBDP: 0.2500 mg | ORAL_TABLET | Freq: Two times a day (BID) | ORAL | Status: DC | PRN

## 2013-10-04 MED ORDER — NITROFURANTOIN MONOHYD MACRO 100 MG PO CAPS: 100.0000 mg | ORAL_CAPSULE | Freq: Two times a day (BID) | ORAL | Status: DC

## 2013-10-04 NOTE — Assessment & Plan Note (Signed)
stable overall by history and exam, recent data reviewed with pt, and pt to continue medical treatment as before,  to f/u any worsening symptoms or concerns Lab Results  Component Value Date   WBC 5.9 06/02/2013   HGB 9.5* 06/02/2013   HCT 28.2* 06/02/2013   PLT 257.0 06/02/2013   GLUCOSE 398* 06/02/2013   CHOL 140 05/23/2013   TRIG 120 05/23/2013   HDL 51 05/23/2013   LDLDIRECT 158.0 04/06/2013   LDLCALC 65 05/23/2013   ALT 15 06/02/2013   AST 18 06/02/2013   NA 134* 06/02/2013   K 3.3* 06/02/2013   CL 96 06/02/2013   CREATININE 1.3* 06/02/2013   BUN 19 06/02/2013   CO2 29 06/02/2013   TSH 1.83 06/02/2013   INR 1.0 06/02/2013   HGBA1C 9.6* 06/02/2013   MICROALBUR 24.1* 04/06/2013

## 2013-10-04 NOTE — Telephone Encounter (Signed)
Also informed of antibiotic sent in to the pharmacy

## 2013-10-04 NOTE — Telephone Encounter (Signed)
Message copied by Biagio Borg on Wed Oct 04, 2013  2:02 PM ------      Message from: Sharon Seller B      Created: Wed Oct 04, 2013  1:40 PM       Critical Lab, Glucose 42 ------

## 2013-10-04 NOTE — Assessment & Plan Note (Signed)
stable overall by history and exam, recent data reviewed with pt, and pt to continue medical treatment as before,  to f/u any worsening symptoms or concerns BP Readings from Last 3 Encounters:  10/04/13 130/82  07/27/13 162/80  06/15/13 139/71

## 2013-10-04 NOTE — Telephone Encounter (Signed)
The daughter called back and she is not checking her blood sugar everyday as having difficulty  meter.  She had only a bite of oatmeal before coming in today.

## 2013-10-04 NOTE — Assessment & Plan Note (Signed)
stable overall by history and exam, recent data reviewed with pt, and pt to continue medical treatment as before,  to f/u any worsening symptoms or concerns, also for f/u ua today and cbc

## 2013-10-04 NOTE — Telephone Encounter (Signed)
Called left message to call back 

## 2013-10-04 NOTE — Patient Instructions (Addendum)
Please continue all other medications as before, and refills have been done if requested. Please have the pharmacy call with any other refills you may need.  Please continue your efforts at being more active, low cholesterol diet, and weight control.  You are otherwise up to date with prevention measures today.  Please go to the LAB in the Basement (turn left off the elevator) for the tests to be done today  You will be contacted by phone if any changes need to be made immediately.  Otherwise, you will receive a letter about your results with an explanation, but please check with MyChart first.  Please return in 6 months, or sooner if needed 

## 2013-10-04 NOTE — Progress Notes (Signed)
Pre-visit discussion using our clinic review tool. No additional management support is needed unless otherwise documented below in the visit note.  

## 2013-10-04 NOTE — Progress Notes (Signed)
Subjective:    Patient ID: [name], female    DOB: 30-May-1931, 80 y.o.   MRN: [id number]  HPI  Here to f/u with duaghter in law with whom she lives (also with son);  overall doing ok and seems well supported overall;  Pt denies chest pain, increased sob or doe, wheezing, orthopnea, PND, increased LE swelling, palpitations, dizziness or syncope.  Pt denies polydipsia, polyuria, or low sugar symptoms such as weakness or confusion improved with po intake.  Pt denies new neurological symptoms such as new headache, or facial or extremity weakness or numbness.   Pt states overall good compliance with meds, has been trying to follow lower cholesterol, diabetic diet, with wt overall stable,  but little exercise however. Does take ensure 1-2 cans per day.  Declines mammogram due to age. Only comes here and Neurology for dementia.  No c/o today.  Did have esr elevated last yr, and hx of chronic anemia, and recurrent uti Past Medical History  Diagnosis Date  . COLONIC POLYPS 06/15/2005  . DIABETES MELLITUS, TYPE II 04/22/2007  . HYPERCHOLESTEROLEMIA   . HYPERLIPIDEMIA   . OBESITY   . ANEMIA-IRON DEFICIENCY   . DEPRESSION   . HYPERTENSION   . ALLERGIC RHINITIS   . ESOPHAGEAL STRICTURE   . GERD   . HIATAL HERNIA   . DIVERTICULOSIS, COLON   . BACK PAIN   . OSTEOPOROSIS   . DIZZINESS   . WEIGHT LOSS   . DYSPNEA   . Other dysphagia   . COLONIC POLYPS, HX OF 04/03/2002 & 06/15/2005    TUBULAR ADENOMA  . History of CVA (cerebrovascular accident) 07/29/2012    Noted old, to right thalamus and pons by Head CT Jul 27, 2012  . Dementia 09/17/2012  . Type II or unspecified type diabetes mellitus without mention of complication, uncontrolled 07/16/2012   Past Surgical History  Procedure Laterality Date  . Appendectomy    . Abdominal hysterectomy    . Tonsillectomy and adenoidectomy    . Left ankle surgury    . S/p bladder pubovaginal sling    . S/p cystocele/rectocele repair    . Laparoscopic  takedown of incarcerated stomach within the chest/nissen  2009  . Esophageal manometry  12/21/2011    Procedure: ESOPHAGEAL MANOMETRY (EM);  Surgeon: Sable Feil, MD;  Location: [facility];  Service: Endoscopy;  Laterality: N/A;    reports that she has never smoked. She has never used smokeless tobacco. She reports that she does not drink alcohol or use illicit drugs. family history includes Colon cancer in her father; Diabetes in her father and mother; Heart attack in her mother; Heart disease (age of onset: 28) in her mother. Allergies  Allergen Reactions  . Keflex [Cephalexin]   . Codeine Nausea And Vomiting and Rash  . Hydrocodone-Acetaminophen Nausea And Vomiting and Rash  . Oxycodone-Acetaminophen Nausea And Vomiting and Rash   Current Outpatient Prescriptions on File Prior to Visit  Medication Sig Dispense Refill  . amLODipine (NORVASC) 10 MG tablet Take 1 tablet (10 mg total) by mouth daily.  90 tablet  3  . atorvastatin (LIPITOR) 10 MG tablet Take 1 tablet (10 mg total) by mouth daily.  90 tablet  3  . clonazePAM (KLONOPIN) 0.25 MG disintegrating tablet Take 1 tablet (0.25 mg total) by mouth 2 (two) times daily as needed.  60 tablet  3  . clopidogrel (PLAVIX) 75 MG tablet Take 1 tablet (75 mg total) by mouth daily.  [place]  tablet  2  . esomeprazole (NEXIUM 24HR) 20 MG capsule Take 1 capsule (20 mg total) by mouth daily before breakfast.  30 capsule  2  . insulin glargine (LANTUS) 100 UNIT/ML injection Inject 0.4 mLs (40 Units total) into the skin daily.  10 mL  11  . KLOR-CON 8 MEQ tablet TAKE ONE TABLET BY MOUTH ONCE DAILY  30 tablet  11  . losartan (COZAAR) 50 MG tablet Take 1 tablet (50 mg total) by mouth daily.  90 tablet  3  . Memantine HCl ER (NAMENDA XR) 28 MG CP24 Take 28 mg by mouth daily.  30 capsule  6  . metFORMIN (GLUCOPHAGE) 500 MG tablet Take 1 tablet (500 mg total) by mouth 2 (two) times daily with a meal.  180 tablet  3   No current facility-administered  medications on file prior to visit.    Review of Systems  Constitutional: Negative for unexpected weight change, or unusual diaphoresis  HENT: Negative for tinnitus.   Eyes: Negative for photophobia and visual disturbance.  Respiratory: Negative for choking and stridor.   Gastrointestinal: Negative for vomiting and blood in stool.  Genitourinary: Negative for hematuria and decreased urine volume.  Musculoskeletal: Negative for acute joint swelling Skin: Negative for color change and wound.  Neurological: Negative for tremors and numbness other than noted  Psychiatric/Behavioral: Negative for decreased concentration or  hyperactivity.       Objective:   Physical Exam BP 130/82  Pulse 90  Temp(Src) 98.2 F (36.8 C) (Oral)  Wt 144 lb (65.318 kg)  SpO2 93% VS noted,  Constitutional: Pt appears well-developed and well-nourished.  HENT: Head: NCAT.  Right Ear: External ear normal.  Left Ear: External ear normal.  Eyes: Conjunctivae and EOM are normal. Pupils are equal, round, and reactive to light.  Neck: Normal range of motion. Neck supple.  Cardiovascular: Normal rate and regular rhythm.   Pulmonary/Chest: Effort normal and breath sounds normal.  Abd:  Soft, NT, non-distended, + BS Neurological: Pt is alert.At baseline confused  Skin: Skin is warm. No erythema.  Psychiatric: Pt behavior is normal. Thought content c/w dementia, formal MMSE not done    Assessment & Plan:

## 2013-10-04 NOTE — Assessment & Plan Note (Signed)
stable overall by history and exam, recent data reviewed with pt, and pt to continue medical treatment as before,  to f/u any worsening symptoms or concerns Lab Results  Component Value Date   HGBA1C 9.6* 06/02/2013

## 2013-10-04 NOTE — Telephone Encounter (Signed)
[name] to contact daughter inlaw with her today; blood sugar low at 42  Does she check sugars at home; and did she eat normally before coming herre/

## 2013-10-05 ENCOUNTER — Encounter (HOSPITAL_COMMUNITY): Payer: Self-pay | Admitting: *Deleted

## 2013-10-05 ENCOUNTER — Inpatient Hospital Stay (HOSPITAL_COMMUNITY)
Admission: EM | Admit: 2013-10-05 | Discharge: 2013-10-10 | DRG: 391 | Disposition: A | Discharge status: Home Health | Payer: Medicare Other | Attending: Internal Medicine | Admitting: Internal Medicine

## 2013-10-05 ENCOUNTER — Inpatient Hospital Stay (HOSPITAL_COMMUNITY): Payer: Medicare Other

## 2013-10-05 DIAGNOSIS — F3289 Other specified depressive episodes: Secondary | ICD-10-CM

## 2013-10-05 DIAGNOSIS — I10 Essential (primary) hypertension: Secondary | ICD-10-CM

## 2013-10-05 DIAGNOSIS — M549 Dorsalgia, unspecified: Secondary | ICD-10-CM

## 2013-10-05 DIAGNOSIS — R634 Abnormal weight loss: Secondary | ICD-10-CM

## 2013-10-05 DIAGNOSIS — F0390 Unspecified dementia without behavioral disturbance: Secondary | ICD-10-CM

## 2013-10-05 DIAGNOSIS — I2489 Other forms of acute ischemic heart disease: Secondary | ICD-10-CM | POA: Diagnosis present

## 2013-10-05 DIAGNOSIS — K219 Gastro-esophageal reflux disease without esophagitis: Secondary | ICD-10-CM

## 2013-10-05 DIAGNOSIS — Z79899 Other long term (current) drug therapy: Secondary | ICD-10-CM

## 2013-10-05 DIAGNOSIS — J9600 Acute respiratory failure, unspecified whether with hypoxia or hypercapnia: Secondary | ICD-10-CM | POA: Diagnosis not present

## 2013-10-05 DIAGNOSIS — Z8719 Personal history of other diseases of the digestive system: Secondary | ICD-10-CM

## 2013-10-05 DIAGNOSIS — Z8673 Personal history of transient ischemic attack (TIA), and cerebral infarction without residual deficits: Secondary | ICD-10-CM

## 2013-10-05 DIAGNOSIS — K5289 Other specified noninfective gastroenteritis and colitis: Principal | ICD-10-CM | POA: Diagnosis present

## 2013-10-05 DIAGNOSIS — Z9181 History of falling: Secondary | ICD-10-CM

## 2013-10-05 DIAGNOSIS — R4182 Altered mental status, unspecified: Secondary | ICD-10-CM

## 2013-10-05 DIAGNOSIS — M810 Age-related osteoporosis without current pathological fracture: Secondary | ICD-10-CM

## 2013-10-05 DIAGNOSIS — I351 Nonrheumatic aortic (valve) insufficiency: Secondary | ICD-10-CM

## 2013-10-05 DIAGNOSIS — R490 Dysphonia: Secondary | ICD-10-CM

## 2013-10-05 DIAGNOSIS — E1140 Type 2 diabetes mellitus with diabetic neuropathy, unspecified: Secondary | ICD-10-CM | POA: Diagnosis present

## 2013-10-05 DIAGNOSIS — Z860100 Personal history of colon polyps, unspecified: Secondary | ICD-10-CM

## 2013-10-05 DIAGNOSIS — R7989 Other specified abnormal findings of blood chemistry: Secondary | ICD-10-CM

## 2013-10-05 DIAGNOSIS — E440 Moderate protein-calorie malnutrition: Secondary | ICD-10-CM | POA: Diagnosis present

## 2013-10-05 DIAGNOSIS — Z87891 Personal history of nicotine dependence: Secondary | ICD-10-CM

## 2013-10-05 DIAGNOSIS — J309 Allergic rhinitis, unspecified: Secondary | ICD-10-CM

## 2013-10-05 DIAGNOSIS — R197 Diarrhea, unspecified: Secondary | ICD-10-CM | POA: Diagnosis present

## 2013-10-05 DIAGNOSIS — J383 Other diseases of vocal cords: Secondary | ICD-10-CM

## 2013-10-05 DIAGNOSIS — R0609 Other forms of dyspnea: Secondary | ICD-10-CM

## 2013-10-05 DIAGNOSIS — Z800 Family history of malignant neoplasm of digestive organs: Secondary | ICD-10-CM

## 2013-10-05 DIAGNOSIS — E1149 Type 2 diabetes mellitus with other diabetic neurological complication: Secondary | ICD-10-CM | POA: Diagnosis present

## 2013-10-05 DIAGNOSIS — Z0000 Encounter for general adult medical examination without abnormal findings: Secondary | ICD-10-CM

## 2013-10-05 DIAGNOSIS — J189 Pneumonia, unspecified organism: Secondary | ICD-10-CM

## 2013-10-05 DIAGNOSIS — E7800 Pure hypercholesterolemia, unspecified: Secondary | ICD-10-CM | POA: Diagnosis present

## 2013-10-05 DIAGNOSIS — Z66 Do not resuscitate: Secondary | ICD-10-CM | POA: Diagnosis present

## 2013-10-05 DIAGNOSIS — R5381 Other malaise: Secondary | ICD-10-CM

## 2013-10-05 DIAGNOSIS — IMO0002 Reserved for concepts with insufficient information to code with codable children: Secondary | ICD-10-CM

## 2013-10-05 DIAGNOSIS — N390 Urinary tract infection, site not specified: Secondary | ICD-10-CM

## 2013-10-05 DIAGNOSIS — K222 Esophageal obstruction: Secondary | ICD-10-CM

## 2013-10-05 DIAGNOSIS — D638 Anemia in other chronic diseases classified elsewhere: Secondary | ICD-10-CM | POA: Diagnosis present

## 2013-10-05 DIAGNOSIS — D509 Iron deficiency anemia, unspecified: Secondary | ICD-10-CM

## 2013-10-05 DIAGNOSIS — Z794 Long term (current) use of insulin: Secondary | ICD-10-CM

## 2013-10-05 DIAGNOSIS — K5730 Diverticulosis of large intestine without perforation or abscess without bleeding: Secondary | ICD-10-CM

## 2013-10-05 DIAGNOSIS — F22 Delusional disorders: Secondary | ICD-10-CM

## 2013-10-05 DIAGNOSIS — R5383 Other fatigue: Secondary | ICD-10-CM

## 2013-10-05 DIAGNOSIS — E1142 Type 2 diabetes mellitus with diabetic polyneuropathy: Secondary | ICD-10-CM | POA: Diagnosis present

## 2013-10-05 DIAGNOSIS — F0280 Dementia in other diseases classified elsewhere without behavioral disturbance: Secondary | ICD-10-CM | POA: Diagnosis present

## 2013-10-05 DIAGNOSIS — F411 Generalized anxiety disorder: Secondary | ICD-10-CM

## 2013-10-05 DIAGNOSIS — Z833 Family history of diabetes mellitus: Secondary | ICD-10-CM

## 2013-10-05 DIAGNOSIS — K449 Diaphragmatic hernia without obstruction or gangrene: Secondary | ICD-10-CM

## 2013-10-05 DIAGNOSIS — N179 Acute kidney failure, unspecified: Secondary | ICD-10-CM

## 2013-10-05 DIAGNOSIS — Z8249 Family history of ischemic heart disease and other diseases of the circulatory system: Secondary | ICD-10-CM

## 2013-10-05 DIAGNOSIS — E669 Obesity, unspecified: Secondary | ICD-10-CM

## 2013-10-05 DIAGNOSIS — E860 Dehydration: Secondary | ICD-10-CM

## 2013-10-05 DIAGNOSIS — F329 Major depressive disorder, single episode, unspecified: Secondary | ICD-10-CM | POA: Diagnosis present

## 2013-10-05 DIAGNOSIS — G459 Transient cerebral ischemic attack, unspecified: Secondary | ICD-10-CM

## 2013-10-05 DIAGNOSIS — E785 Hyperlipidemia, unspecified: Secondary | ICD-10-CM

## 2013-10-05 DIAGNOSIS — R269 Unspecified abnormalities of gait and mobility: Secondary | ICD-10-CM

## 2013-10-05 DIAGNOSIS — E1165 Type 2 diabetes mellitus with hyperglycemia: Secondary | ICD-10-CM | POA: Diagnosis present

## 2013-10-05 DIAGNOSIS — R110 Nausea: Secondary | ICD-10-CM

## 2013-10-05 DIAGNOSIS — K5900 Constipation, unspecified: Secondary | ICD-10-CM

## 2013-10-05 DIAGNOSIS — R339 Retention of urine, unspecified: Secondary | ICD-10-CM | POA: Diagnosis not present

## 2013-10-05 DIAGNOSIS — R1319 Other dysphagia: Secondary | ICD-10-CM

## 2013-10-05 DIAGNOSIS — I248 Other forms of acute ischemic heart disease: Secondary | ICD-10-CM | POA: Diagnosis present

## 2013-10-05 DIAGNOSIS — R350 Frequency of micturition: Secondary | ICD-10-CM

## 2013-10-05 DIAGNOSIS — E1169 Type 2 diabetes mellitus with other specified complication: Secondary | ICD-10-CM

## 2013-10-05 LAB — URINALYSIS, ROUTINE W REFLEX MICROSCOPIC
BILIRUBIN URINE: NEGATIVE
Glucose, UA: 500 mg/dL — AB
Ketones, ur: NEGATIVE mg/dL
Nitrite: POSITIVE — AB
PH: 6 (ref 5.0–8.0)
Protein, ur: 30 mg/dL — AB
Specific Gravity, Urine: 1.014 (ref 1.005–1.030)
Urobilinogen, UA: 0.2 mg/dL (ref 0.0–1.0)

## 2013-10-05 LAB — COMPREHENSIVE METABOLIC PANEL WITH GFR
ALT: 17 U/L (ref 0–35)
AST: 24 U/L (ref 0–37)
Albumin: 3.4 g/dL — ABNORMAL LOW (ref 3.5–5.2)
Alkaline Phosphatase: 59 U/L (ref 39–117)
BUN: 23 mg/dL (ref 6–23)
CO2: 22 meq/L (ref 19–32)
Calcium: 8.8 mg/dL (ref 8.4–10.5)
Chloride: 98 meq/L (ref 96–112)
Creatinine, Ser: 1.37 mg/dL — ABNORMAL HIGH (ref 0.50–1.10)
GFR calc Af Amer: 41 mL/min — ABNORMAL LOW
GFR calc non Af Amer: 35 mL/min — ABNORMAL LOW
Glucose, Bld: 334 mg/dL — ABNORMAL HIGH (ref 70–99)
Potassium: 4.2 meq/L (ref 3.7–5.3)
Sodium: 137 meq/L (ref 137–147)
Total Bilirubin: 0.3 mg/dL (ref 0.3–1.2)
Total Protein: 7.6 g/dL (ref 6.0–8.3)

## 2013-10-05 LAB — CBC WITH DIFFERENTIAL/PLATELET
BASOS ABS: 0 10*3/uL (ref 0.0–0.1)
Basophils Relative: 0 % (ref 0–1)
Eosinophils Absolute: 0 10*3/uL (ref 0.0–0.7)
Eosinophils Relative: 0 % (ref 0–5)
HCT: 26.2 % — ABNORMAL LOW (ref 36.0–46.0)
HEMOGLOBIN: 8.5 g/dL — AB (ref 12.0–15.0)
LYMPHS PCT: 8 % — AB (ref 12–46)
Lymphs Abs: 1.5 10*3/uL (ref 0.7–4.0)
MCH: 28.9 pg (ref 26.0–34.0)
MCHC: 32.4 g/dL (ref 30.0–36.0)
MCV: 89.1 fL (ref 78.0–100.0)
MONO ABS: 1.2 10*3/uL — AB (ref 0.1–1.0)
MONOS PCT: 7 % (ref 3–12)
NEUTROS ABS: 15 10*3/uL — AB (ref 1.7–7.7)
Neutrophils Relative %: 85 % — ABNORMAL HIGH (ref 43–77)
Platelets: 293 10*3/uL (ref 150–400)
RBC: 2.94 MIL/uL — ABNORMAL LOW (ref 3.87–5.11)
RDW: 14.2 % (ref 11.5–15.5)
WBC: 17.7 10*3/uL — AB (ref 4.0–10.5)

## 2013-10-05 LAB — URINE MICROSCOPIC-ADD ON

## 2013-10-05 LAB — OCCULT BLOOD, POC DEVICE: Fecal Occult Bld: NEGATIVE

## 2013-10-05 LAB — LACTIC ACID, PLASMA: Lactic Acid, Venous: 3.1 mmol/L — ABNORMAL HIGH (ref 0.5–2.2)

## 2013-10-05 MED ORDER — CLONAZEPAM 0.125 MG PO TBDP
0.2500 mg | ORAL_TABLET | Freq: Two times a day (BID) | ORAL | Status: DC | PRN
  Administered 2013-10-09: 0.25 mg via ORAL
  Filled 2013-10-05: qty 2

## 2013-10-05 MED ORDER — ACETAMINOPHEN 325 MG PO TABS
650.0000 mg | ORAL_TABLET | Freq: Four times a day (QID) | ORAL | Status: DC | PRN
  Administered 2013-10-08: 650 mg via ORAL
  Filled 2013-10-05: qty 2

## 2013-10-05 MED ORDER — LORATADINE 10 MG PO TABS
10.0000 mg | ORAL_TABLET | Freq: Every day | ORAL | Status: DC
  Administered 2013-10-06 – 2013-10-10 (×5): 10 mg via ORAL
  Filled 2013-10-05 (×5): qty 1

## 2013-10-05 MED ORDER — AMLODIPINE BESYLATE 10 MG PO TABS
10.0000 mg | ORAL_TABLET | Freq: Every day | ORAL | Status: DC
  Administered 2013-10-06 – 2013-10-10 (×5): 10 mg via ORAL
  Filled 2013-10-05 (×5): qty 1

## 2013-10-05 MED ORDER — INSULIN ASPART 100 UNIT/ML ~~LOC~~ SOLN
0.0000 [IU] | Freq: Three times a day (TID) | SUBCUTANEOUS | Status: DC
  Administered 2013-10-06: 3 [IU] via SUBCUTANEOUS
  Administered 2013-10-06: 1 [IU] via SUBCUTANEOUS
  Administered 2013-10-07: 2 [IU] via SUBCUTANEOUS
  Administered 2013-10-08: 3 [IU] via SUBCUTANEOUS
  Administered 2013-10-08: 2 [IU] via SUBCUTANEOUS
  Administered 2013-10-09: 1 [IU] via SUBCUTANEOUS
  Administered 2013-10-09: 7 [IU] via SUBCUTANEOUS
  Administered 2013-10-09: 2 [IU] via SUBCUTANEOUS
  Administered 2013-10-10: 1 [IU] via SUBCUTANEOUS
  Administered 2013-10-10: 3 [IU] via SUBCUTANEOUS

## 2013-10-05 MED ORDER — ONDANSETRON HCL 4 MG/2ML IJ SOLN: 4.0000 mg | Freq: Four times a day (QID) | INTRAMUSCULAR | Status: DC | PRN

## 2013-10-05 MED ORDER — SODIUM CHLORIDE 0.9 % IV BOLUS (SEPSIS)
1000.0000 mL | Freq: Once | INTRAVENOUS | Status: AC
  Administered 2013-10-05: 1000 mL via INTRAVENOUS

## 2013-10-05 MED ORDER — MEMANTINE HCL ER 7 MG PO CP24
28.0000 mg | ORAL_CAPSULE | Freq: Every day | ORAL | Status: DC
  Administered 2013-10-06 – 2013-10-10 (×5): 28 mg via ORAL
  Filled 2013-10-05 (×5): qty 4

## 2013-10-05 MED ORDER — CIPROFLOXACIN IN D5W 400 MG/200ML IV SOLN
400.0000 mg | Freq: Once | INTRAVENOUS | Status: AC
  Administered 2013-10-05: 400 mg via INTRAVENOUS
  Filled 2013-10-05: qty 200

## 2013-10-05 MED ORDER — SODIUM CHLORIDE 0.9 % IJ SOLN
3.0000 mL | Freq: Two times a day (BID) | INTRAMUSCULAR | Status: DC
  Administered 2013-10-07: 3 mL via INTRAVENOUS

## 2013-10-05 MED ORDER — ACETAMINOPHEN 650 MG RE SUPP: 650.0000 mg | Freq: Four times a day (QID) | RECTAL | Status: DC | PRN

## 2013-10-05 MED ORDER — METRONIDAZOLE IN NACL 5-0.79 MG/ML-% IV SOLN
500.0000 mg | Freq: Once | INTRAVENOUS | Status: AC
  Administered 2013-10-05: 500 mg via INTRAVENOUS
  Filled 2013-10-05: qty 100

## 2013-10-05 MED ORDER — HYDRALAZINE HCL 20 MG/ML IJ SOLN: 10.0000 mg | INTRAMUSCULAR | Status: DC | PRN

## 2013-10-05 MED ORDER — ONDANSETRON HCL 4 MG PO TABS
4.0000 mg | ORAL_TABLET | Freq: Four times a day (QID) | ORAL | Status: DC | PRN
  Administered 2013-10-08: 4 mg via ORAL
  Filled 2013-10-05: qty 1

## 2013-10-05 MED ORDER — SODIUM CHLORIDE 0.9 % IV SOLN
INTRAVENOUS | Status: AC
  Administered 2013-10-05 – 2013-10-06 (×2): via INTRAVENOUS

## 2013-10-05 MED ORDER — CLOPIDOGREL BISULFATE 75 MG PO TABS
75.0000 mg | ORAL_TABLET | Freq: Every day | ORAL | Status: DC
  Administered 2013-10-06 – 2013-10-10 (×5): 75 mg via ORAL
  Filled 2013-10-05 (×6): qty 1

## 2013-10-05 MED ORDER — ATORVASTATIN CALCIUM 10 MG PO TABS
10.0000 mg | ORAL_TABLET | Freq: Every day | ORAL | Status: DC
  Administered 2013-10-06 – 2013-10-09 (×4): 10 mg via ORAL
  Filled 2013-10-05 (×5): qty 1

## 2013-10-05 MED ORDER — METRONIDAZOLE IN NACL 5-0.79 MG/ML-% IV SOLN
500.0000 mg | Freq: Three times a day (TID) | INTRAVENOUS | Status: DC
  Administered 2013-10-06 (×2): 500 mg via INTRAVENOUS
  Filled 2013-10-05 (×3): qty 100

## 2013-10-05 MED ORDER — INSULIN GLARGINE 100 UNIT/ML ~~LOC~~ SOLN
20.0000 [IU] | Freq: Every day | SUBCUTANEOUS | Status: DC
Start: 2013-10-05 — End: 2013-10-06
  Administered 2013-10-05: 20 [IU] via SUBCUTANEOUS
  Filled 2013-10-05 (×2): qty 0.2

## 2013-10-05 MED ORDER — ONDANSETRON HCL 4 MG/2ML IJ SOLN: 4.0000 mg | Freq: Three times a day (TID) | INTRAMUSCULAR | Status: AC | PRN

## 2013-10-05 MED ORDER — CIPROFLOXACIN IN D5W 400 MG/200ML IV SOLN: 400.0000 mg | INTRAVENOUS | Status: DC

## 2013-10-05 NOTE — ED Notes (Signed)
Bed: WA01 Expected date:  Expected time:  Means of arrival:  Comments: EMS- Generalized Weakness

## 2013-10-05 NOTE — Progress Notes (Signed)
ANTIBIOTIC CONSULT NOTE - INITIAL  Pharmacy Consult for Cipro Indication: UTI/Diarrhea  Allergies  Allergen Reactions  . Keflex [Cephalexin]   . Codeine Nausea And Vomiting and Rash  . Hydrocodone-Acetaminophen Nausea And Vomiting and Rash  . Oxycodone-Acetaminophen Nausea And Vomiting and Rash    Patient Measurements: Height: 5\' 3"  (160 cm) Weight: 140 lb 6.4 oz (63.685 kg) IBW/kg (Calculated) : 52.4   Vital Signs: Temp: 98.4 F (36.9 C) (01/29 2233) Temp src: Oral (01/29 2233) BP: 146/54 mmHg (01/29 2233) Pulse Rate: 89 (01/29 2233) Intake/Output from previous day:   Intake/Output from this shift:    Labs:  Recent Labs  10/04/13 1108 10/05/13 1940  WBC 12.0* 17.7*  HGB 9.4* 8.5*  PLT 334.0 293  CREATININE 1.2 1.37*   Estimated Creatinine Clearance: 29.4 ml/min (by C-G formula based on Cr of 1.37). No results found for this basename: VANCOTROUGH, VANCOPEAK, VANCORANDOM, GENTTROUGH, GENTPEAK, GENTRANDOM, TOBRATROUGH, TOBRAPEAK, TOBRARND, AMIKACINPEAK, AMIKACINTROU, AMIKACIN,  in the last 72 hours   Microbiology: No results found for this or any previous visit (from the past 720 hour(s)).  Medical History: Past Medical History  Diagnosis Date  . COLONIC POLYPS 06/15/2005  . DIABETES MELLITUS, TYPE II 04/22/2007  . HYPERCHOLESTEROLEMIA   . HYPERLIPIDEMIA   . OBESITY   . ANEMIA-IRON DEFICIENCY   . DEPRESSION   . HYPERTENSION   . ALLERGIC RHINITIS   . ESOPHAGEAL STRICTURE   . GERD   . HIATAL HERNIA   . DIVERTICULOSIS, COLON   . BACK PAIN   . OSTEOPOROSIS   . DIZZINESS   . WEIGHT LOSS   . DYSPNEA   . Other dysphagia   . COLONIC POLYPS, HX OF 04/03/2002 & 06/15/2005    TUBULAR ADENOMA  . History of CVA (cerebrovascular accident) 07/29/2012    Noted old, to right thalamus and pons by Head CT Jul 27, 2012  . Dementia 09/17/2012  . Type II or unspecified type diabetes mellitus without mention of complication, uncontrolled 07/16/2012    Medications:   Scheduled:  . [START ON 10/06/2013] amLODipine  10 mg Oral Daily  . [START ON 10/06/2013] atorvastatin  10 mg Oral Daily  . ciprofloxacin  400 mg Intravenous Once  . [START ON 10/06/2013] ciprofloxacin  400 mg Intravenous Q24H  . [START ON 10/06/2013] clopidogrel  75 mg Oral Daily  . [START ON 10/06/2013] insulin aspart  0-9 Units Subcutaneous TID WC  . insulin glargine  20 Units Subcutaneous QHS  . [START ON 10/06/2013] loratadine  10 mg Oral Daily  . [START ON 10/06/2013] Memantine HCl ER  28 mg Oral Daily  . [START ON 10/06/2013] metronidazole  500 mg Intravenous Q8H  . sodium chloride  3 mL Intravenous Q12H   Infusions:  . sodium chloride     Assessment: 80 yo admitted with diarrhea and found to have UTI.  Flagyl per MD and Cipro per Rx.  Goal of Therapy:  Treat infection  Plan:   Cipro 400mg  IV q24h  CrCl~29  F/U SCr/cultures/adjust frequency as needed  Dorrene German 10/05/2013,11:41 PM

## 2013-10-05 NOTE — ED Notes (Signed)
Began having diarrhea last night. Denies pain but acknowledges being weak.

## 2013-10-05 NOTE — H&P (Addendum)
[facility] History and Physical  [name] ZOX:[id number] DOB: 06-22-1931 DOA: 10/05/2013  Referring physician: ER physician. PCP: Cathlean Cower, MD   Chief Complaint: Diarrhea.  HPI: [name] is a 80 y.o. female with history of diabetes mellitus type 2, hypertension, hyperlipidemia, history of TIA, dementia and chronic anemia was brought to the ER patient had multiple episodes of diarrhea since last night. Patient also was feeling weak and has had 3 falls in her house. Patient during the falls as per the patient's daughter did not lose consciousness. She also had one large loose bowel movement in the ER which was nonbloody. Stool for blood was negative. Lactic acid was found to be elevated. Patient has been feeling extremely weak and tired. Otherwise patient denies any chest pain or shortness of breath. Per patient's family patient has been having poor appetite over the last 2 days. Patient had gone for routine office visit to her primary care 2 days ago and since patient has had recurrent UTI UA was done which are showing features concerning for UTI. Macrobid was called in and was only taken today around afternoon the first dose. Patient's diarrhea as per the family had started even before Macrobid was given. On my exam patient's abdomen appears benign. As per the family patient also was looking confused at times hallucinating. Presently patient is alert awake oriented to her name and follows commands.  Review of Systems: As presented in the history of presenting illness, rest negative.  Past Medical History  Diagnosis Date  . COLONIC POLYPS 06/15/2005  . DIABETES MELLITUS, TYPE II 04/22/2007  . HYPERCHOLESTEROLEMIA   . HYPERLIPIDEMIA   . OBESITY   . ANEMIA-IRON DEFICIENCY   . DEPRESSION   . HYPERTENSION   . ALLERGIC RHINITIS   . ESOPHAGEAL STRICTURE   . GERD   . HIATAL HERNIA   . DIVERTICULOSIS, COLON   . BACK PAIN   . OSTEOPOROSIS   . DIZZINESS   . WEIGHT LOSS    . DYSPNEA   . Other dysphagia   . COLONIC POLYPS, HX OF 04/03/2002 & 06/15/2005    TUBULAR ADENOMA  . History of CVA (cerebrovascular accident) 07/29/2012    Noted old, to right thalamus and pons by Head CT Jul 27, 2012  . Dementia 09/17/2012  . Type II or unspecified type diabetes mellitus without mention of complication, uncontrolled 07/16/2012   Past Surgical History  Procedure Laterality Date  . Appendectomy    . Abdominal hysterectomy    . Tonsillectomy and adenoidectomy    . Left ankle surgury    . S/p bladder pubovaginal sling    . S/p cystocele/rectocele repair    . Laparoscopic takedown of incarcerated stomach within the chest/nissen  2009  . Esophageal manometry  12/21/2011    Procedure: ESOPHAGEAL MANOMETRY (EM);  Surgeon: Sable Feil, MD;  Location: [facility];  Service: Endoscopy;  Laterality: N/A;   Social History:  reports that she has never smoked. She has never used smokeless tobacco. She reports that she does not drink alcohol or use illicit drugs. Where does patient live home. Can patient participate in ADLs? Not sure.  Allergies  Allergen Reactions  . Keflex [Cephalexin]   . Codeine Nausea And Vomiting and Rash  . Hydrocodone-Acetaminophen Nausea And Vomiting and Rash  . Oxycodone-Acetaminophen Nausea And Vomiting and Rash    Family History:  Family History  Problem Relation Age of Onset  . Heart disease Mother 37  . Diabetes Mother   .  Heart attack Mother   . Colon cancer Father   . Diabetes Father       Prior to Admission medications   Medication Sig Start Date End Date Taking? Authorizing Provider  amLODipine (NORVASC) 10 MG tablet Take 1 tablet (10 mg total) by mouth daily. 07/27/13  Yes Biagio Borg, MD  atorvastatin (LIPITOR) 10 MG tablet Take 1 tablet (10 mg total) by mouth daily. 04/06/13  Yes Biagio Borg, MD  cetirizine (ZYRTEC) 10 MG tablet Take 10 mg by mouth daily.   Yes Historical Provider, MD  clonazePAM (KLONOPIN) 0.25 MG  disintegrating tablet Take 0.25 mg by mouth 2 (two) times daily as needed (anxiety). 10/04/13  Yes Biagio Borg, MD  clopidogrel (PLAVIX) 75 MG tablet Take 1 tablet (75 mg total) by mouth daily. 05/23/13  Yes Estela Leonie Green, MD  esomeprazole (NEXIUM 24HR) 20 MG capsule Take 1 capsule (20 mg total) by mouth daily before breakfast. 06/02/13  Yes Evie Lacks Plotnikov, MD  insulin glargine (LANTUS) 100 UNIT/ML injection Inject 0.4 mLs (40 Units total) into the skin daily. 04/06/13  Yes Biagio Borg, MD  KLOR-CON 8 MEQ tablet TAKE ONE TABLET BY MOUTH ONCE DAILY 08/01/13  Yes Biagio Borg, MD  losartan (COZAAR) 50 MG tablet Take 1 tablet (50 mg total) by mouth daily. 04/06/13  Yes Biagio Borg, MD  MELATONIN PO Take 1 tablet by mouth at bedtime as needed (sleep).   Yes Historical Provider, MD  Memantine HCl ER (NAMENDA XR) 28 MG CP24 Take 28 mg by mouth daily. 08/22/13  Yes Dennie Bible, NP  metFORMIN (GLUCOPHAGE) 500 MG tablet Take 1 tablet (500 mg total) by mouth 2 (two) times daily with a meal. 04/06/13  Yes Biagio Borg, MD  nitrofurantoin, macrocrystal-monohydrate, (MACROBID) 100 MG capsule Take 100 mg by mouth 2 (two) times daily. For 10 days. 10/05/13  Yes Biagio Borg, MD    Physical Exam: Filed Vitals:   10/05/13 2029 10/05/13 2120 10/05/13 2148 10/05/13 2233  BP: 138/58 134/89 126/41 146/54  Pulse: 88 91 88 89  Temp:   98.1 F (36.7 C) 98.4 F (36.9 C)  TempSrc:   Oral Oral  Resp: 17 18 18 18   Height:    5\' 3"  (1.6 m)  Weight:    63.685 kg (140 lb 6.4 oz)  SpO2: 93% 98% 97% 98%     General:  Well developed and nourished.  Eyes: Anicteric no pallor.  ENT: No discharge from the ears eyes nose mouth.  Neck: No mass felt.  Cardiovascular: S1-S2 heard.  Respiratory: No rhonchi or crepitations.  Abdomen: Soft nontender bowel sounds present.  Skin: No rash.  Musculoskeletal: No edema.  Psychiatric: Appears normal.  Neurologic: Alert awake oriented to name and  place. Moves all extremities.  Labs on Admission:  Basic Metabolic Panel:  Recent Labs Lab 10/04/13 1108 10/05/13 1940  NA 137 137  K 4.0 4.2  CL 102 98  CO2 27 22  GLUCOSE 42* 334*  BUN 22 23  CREATININE 1.2 1.37*  CALCIUM 9.6 8.8   Liver Function Tests:  Recent Labs Lab 10/04/13 1108 10/05/13 1940  AST 23 24  ALT 19 17  ALKPHOS 57 59  BILITOT 0.4 0.3  PROT 7.9 7.6  ALBUMIN 3.8 3.4*   No results found for this basename: LIPASE, AMYLASE,  in the last 168 hours No results found for this basename: AMMONIA,  in the last 168 hours CBC:  Recent  Labs Lab 10/04/13 1108 10/05/13 1940  WBC 12.0* 17.7*  NEUTROABS 8.9* 15.0*  HGB 9.4* 8.5*  HCT 28.9* 26.2*  MCV 89.0 89.1  PLT 334.0 293   Cardiac Enzymes: No results found for this basename: CKTOTAL, CKMB, CKMBINDEX, TROPONINI,  in the last 168 hours  BNP (last 3 results)  Recent Labs  05/22/13 1640  PROBNP 173.3   CBG: No results found for this basename: GLUCAP,  in the last 168 hours  Radiological Exams on Admission: No results found.  EKG: Independently reviewed. Normal sinus rhythm with ST depression in the lateral leads. There was concern for abnormal rhythm in the telemetry monitor.  Assessment/Plan Principal Problem:   Diarrhea Active Problems:   Type 2 diabetes, uncontrolled, with neuropathy   Dementia   Elevated lactic acid level   Dehydration   ARF (acute renal failure)   UTI (lower urinary tract infection)   1. Nausea vomiting and diarrhea - at this time stool cultures and stool for C. difficile has been ordered. As per patient's family patient has not come in sick contacts. Patient looks dehydrated for which we will continue with hydration and at this time patient has been empirically placed on Flagyl along with Cipro. Cipro for UTI. Closely follow intake output and metabolic panel. 2. Acute renal failure and Dehydration from diarrhea - continue with hydration. 3. Elevated lactic acid level  - probably from dehydration. If lactic acid level does not get better with hydration consider CT abdomen pelvis. 4. UTI - follow urine cultures. Patient is on Cipro. 5. Weakness with falls - CT head and x-ray of the pelvis are unremarkable. 6. Diabetes mellitus type 2 - patient recently check labs showed patient was hypoglycemic at patient's PCPs office. Patient's daughter in law with whom I had spoken states that she has not been eating well recently. At this time I have placed patient on half her home dose of Lantus. She takes in the morning but family states she has not taken the morning dose and blood sugars are in the 300s. So I have ordered one dose of Lantus 20 units subcutaneous at bedtime and place the dose at bedtime. Closely follow CBGs. 7. Hypertension - continue amlodipine. Since patient has renal failure I'm holding of Cozaar. I have placed patient on when necessary IV hydralazine for systolic blood pressure more than 160. 8. Chronic and deficiency anemia - follow CBC. Stool for occult blood is negative. 9. History of TIA - on Plavix. 10. Leukocytosis probably from diarrhea and UTI. 11. ST depression in the lateral leads of the EKG - patient has no chest pain. Changes comparable to old EKG but since patient has been complaining of weakness check troponin.  I have reviewed patient's old charts and labs.  Code Status: DO NOT RESUSCITATE.  Family Communication: Discussed with patient's daughter in law.  Disposition Plan: Admit to inpatient.    Damarie Schoolfield N. [facility] Pager [phone].  If 7PM-7AM, please contact night-coverage [email or web address] Password [email or web address] 10/05/2013, 11:12 PM

## 2013-10-05 NOTE — ED Provider Notes (Signed)
CSN: [id number]     Arrival date & time 10/05/13  1850 History   First MD Initiated Contact with Patient 10/05/13 1857     Chief Complaint  Patient presents with  . Diarrhea   (Consider location/radiation/quality/duration/timing/severity/associated sxs/prior Treatment) HPI Comments: Family also report increased confusion of 1 day, brief visual hallucination earlier today.  Weakness causing one fall yesterday and two this afternoon. She has been unable to stand.  She is normally ambulatory w/ a walker.   Patient is a 80 y.o. female presenting with diarrhea. The history is provided by the patient and a relative. The history is limited by the condition of the patient.  Diarrhea Quality:  Semi-solid Severity:  Severe Onset quality:  Sudden Duration:  1 day Timing:  Intermittent Progression:  Unchanged Relieved by:  Nothing Worsened by:  Nothing tried Ineffective treatments:  None tried Associated symptoms: no abdominal pain, no arthralgias, no chills, no diaphoresis, no fever, no headaches, no URI and no vomiting   Risk factors: recent antibiotic use (started on Abx yesterday for UTI. Took first macrobid this morning)     Past Medical History  Diagnosis Date  . COLONIC POLYPS 06/15/2005  . DIABETES MELLITUS, TYPE II 04/22/2007  . HYPERCHOLESTEROLEMIA   . HYPERLIPIDEMIA   . OBESITY   . ANEMIA-IRON DEFICIENCY   . DEPRESSION   . HYPERTENSION   . ALLERGIC RHINITIS   . ESOPHAGEAL STRICTURE   . GERD   . HIATAL HERNIA   . DIVERTICULOSIS, COLON   . BACK PAIN   . OSTEOPOROSIS   . DIZZINESS   . WEIGHT LOSS   . DYSPNEA   . Other dysphagia   . COLONIC POLYPS, HX OF 04/03/2002 & 06/15/2005    TUBULAR ADENOMA  . History of CVA (cerebrovascular accident) 07/29/2012    Noted old, to right thalamus and pons by Head CT Jul 27, 2012  . Dementia 09/17/2012  . Type II or unspecified type diabetes mellitus without mention of complication, uncontrolled 07/16/2012   Past Surgical History   Procedure Laterality Date  . Appendectomy    . Abdominal hysterectomy    . Tonsillectomy and adenoidectomy    . Left ankle surgury    . S/p bladder pubovaginal sling    . S/p cystocele/rectocele repair    . Laparoscopic takedown of incarcerated stomach within the chest/nissen  2009  . Esophageal manometry  12/21/2011    Procedure: ESOPHAGEAL MANOMETRY (EM);  Surgeon: Sable Feil, MD;  Location: [facility];  Service: Endoscopy;  Laterality: N/A;   Family History  Problem Relation Age of Onset  . Heart disease Mother 76  . Diabetes Mother   . Heart attack Mother   . Colon cancer Father   . Diabetes Father    History  Substance Use Topics  . Smoking status: Never Smoker   . Smokeless tobacco: Never Used  . Alcohol Use: No   OB History   Grav Para Term Preterm Abortions TAB SAB Ect Mult Living                 Review of Systems  Constitutional: Negative for fever, chills, diaphoresis, activity change, appetite change and fatigue.  HENT: Negative for congestion, facial swelling, rhinorrhea and sore throat.   Eyes: Negative for photophobia and discharge.  Respiratory: Negative for cough, chest tightness and shortness of breath.   Cardiovascular: Negative for chest pain, palpitations and leg swelling.  Gastrointestinal: Positive for diarrhea. Negative for nausea, vomiting and abdominal pain.  Endocrine: Negative  for polydipsia and polyuria.  Genitourinary: Negative for dysuria, frequency, difficulty urinating and pelvic pain.  Musculoskeletal: Negative for arthralgias, back pain, neck pain and neck stiffness.  Skin: Negative for color change and wound.  Allergic/Immunologic: Negative for immunocompromised state.  Neurological: Positive for weakness. Negative for facial asymmetry, numbness and headaches.  Hematological: Does not bruise/bleed easily.  Psychiatric/Behavioral: Positive for hallucinations. Negative for confusion and agitation.    Allergies  Keflex;  Codeine; Hydrocodone-acetaminophen; and Oxycodone-acetaminophen  Home Medications   No current outpatient prescriptions on file. BP 126/41  Pulse 88  Temp(Src) 98.1 F (36.7 C) (Oral)  Resp 18  SpO2 97% Physical Exam  Constitutional: She is oriented to person, place, and time. She appears well-developed and well-nourished. No distress.  Has stooled in bed  HENT:  Head: Normocephalic and atraumatic.  Mouth/Throat: No oropharyngeal exudate.  Eyes: Pupils are equal, round, and reactive to light.  Neck: Normal range of motion. Neck supple.  Cardiovascular: Normal rate, regular rhythm and normal heart sounds.  Exam reveals no gallop and no friction rub.   No murmur heard. Pulmonary/Chest: Effort normal and breath sounds normal. No respiratory distress. She has no wheezes. She has no rales.  Abdominal: Soft. Bowel sounds are normal. She exhibits no distension and no mass. There is no tenderness. There is no rebound and no guarding.  Musculoskeletal: Normal range of motion. She exhibits no edema and no tenderness.  Neurological: She is alert and oriented to person, place, and time. GCS eye subscore is 4. GCS verbal subscore is 4. GCS motor subscore is 6.  Skin: Skin is warm and dry.  Psychiatric: She has a normal mood and affect.    ED Course  Procedures (including critical care time) Labs Review Labs Reviewed  CBC WITH DIFFERENTIAL - Abnormal; Notable for the following:    WBC 17.7 (*)    RBC 2.94 (*)    Hemoglobin 8.5 (*)    HCT 26.2 (*)    Neutrophils Relative % 85 (*)    Neutro Abs 15.0 (*)    Lymphocytes Relative 8 (*)    Monocytes Absolute 1.2 (*)    All other components within normal limits  COMPREHENSIVE METABOLIC PANEL - Abnormal; Notable for the following:    Glucose, Bld 334 (*)    Creatinine, Ser 1.37 (*)    Albumin 3.4 (*)    GFR calc non Af Amer 35 (*)    GFR calc Af Amer 41 (*)    All other components within normal limits  LACTIC ACID, PLASMA - Abnormal;  Notable for the following:    Lactic Acid, Venous 3.1 (*)    All other components within normal limits  URINALYSIS, ROUTINE W REFLEX MICROSCOPIC - Abnormal; Notable for the following:    APPearance CLOUDY (*)    Glucose, UA 500 (*)    Hgb urine dipstick TRACE (*)    Protein, ur 30 (*)    Nitrite POSITIVE (*)    Leukocytes, UA SMALL (*)    All other components within normal limits  URINE MICROSCOPIC-ADD ON - Abnormal; Notable for the following:    Bacteria, UA MANY (*)    All other components within normal limits  CLOSTRIDIUM DIFFICILE BY PCR  OCCULT BLOOD, POC DEVICE   Imaging Review No results found.    MDM   1. Diarrhea   2. Elevated lactic acid level   3. UTI (lower urinary tract infection)   4. Altered mental status    Pt is a 80  y.o. female with Pmhx as above who presents with diffuse d/a beginning at 2 am this morning.  Family reports she has had inc confusion, weakness, fell once yesterday and twice today while trying to stand.  Pt seen by PCP yesterday before symptoms started, placed on macrobid for UTI, took first dose this morning.  On exam, pt has reports generalized weakness, denies ab pain, n/v, urinary symptoms CP, fever, SOB, h/a.  W/U shows elevated WBC, elevated LA, 1 pt hb drop from yesterday, but heme negative stool.  Cr stable.  Cl diff sent.  Cipro/flagyl given. [facility] consulted, will admit to tele.            Neta Ehlers, MD 10/05/13 [phone]

## 2013-10-05 NOTE — ED Notes (Addendum)
Pt is from and has loose bowel when she stands up she has loose bowel possible c diff was placed on abx 2days ago uti.  cbg 347, iv started by ems 500cc bolus .

## 2013-10-06 DIAGNOSIS — D509 Iron deficiency anemia, unspecified: Secondary | ICD-10-CM

## 2013-10-06 DIAGNOSIS — I359 Nonrheumatic aortic valve disorder, unspecified: Secondary | ICD-10-CM

## 2013-10-06 DIAGNOSIS — F0390 Unspecified dementia without behavioral disturbance: Secondary | ICD-10-CM

## 2013-10-06 LAB — CBC WITH DIFFERENTIAL/PLATELET
BASOS ABS: 0 10*3/uL (ref 0.0–0.1)
BASOS PCT: 0 % (ref 0–1)
Eosinophils Absolute: 0 10*3/uL (ref 0.0–0.7)
Eosinophils Relative: 0 % (ref 0–5)
HCT: 23.3 % — ABNORMAL LOW (ref 36.0–46.0)
Hemoglobin: 7.5 g/dL — ABNORMAL LOW (ref 12.0–15.0)
Lymphocytes Relative: 12 % (ref 12–46)
Lymphs Abs: 1.7 10*3/uL (ref 0.7–4.0)
MCH: 28.6 pg (ref 26.0–34.0)
MCHC: 32.2 g/dL (ref 30.0–36.0)
MCV: 88.9 fL (ref 78.0–100.0)
Monocytes Absolute: 1 10*3/uL (ref 0.1–1.0)
Monocytes Relative: 8 % (ref 3–12)
NEUTROS PCT: 80 % — AB (ref 43–77)
Neutro Abs: 11.1 10*3/uL — ABNORMAL HIGH (ref 1.7–7.7)
Platelets: 276 10*3/uL (ref 150–400)
RBC: 2.62 MIL/uL — ABNORMAL LOW (ref 3.87–5.11)
RDW: 14.1 % (ref 11.5–15.5)
WBC: 13.9 10*3/uL — ABNORMAL HIGH (ref 4.0–10.5)

## 2013-10-06 LAB — GLUCOSE, CAPILLARY
GLUCOSE-CAPILLARY: 106 mg/dL — AB (ref 70–99)
Glucose-Capillary: 145 mg/dL — ABNORMAL HIGH (ref 70–99)
Glucose-Capillary: 207 mg/dL — ABNORMAL HIGH (ref 70–99)

## 2013-10-06 LAB — FERRITIN: Ferritin: 42 ng/mL (ref 10–291)

## 2013-10-06 LAB — COMPREHENSIVE METABOLIC PANEL
ALT: 14 U/L (ref 0–35)
AST: 23 U/L (ref 0–37)
Albumin: 2.8 g/dL — ABNORMAL LOW (ref 3.5–5.2)
Alkaline Phosphatase: 54 U/L (ref 39–117)
BUN: 18 mg/dL (ref 6–23)
CALCIUM: 8.3 mg/dL — AB (ref 8.4–10.5)
CO2: 24 meq/L (ref 19–32)
Chloride: 101 mEq/L (ref 96–112)
Creatinine, Ser: 1.18 mg/dL — ABNORMAL HIGH (ref 0.50–1.10)
GFR, EST AFRICAN AMERICAN: 49 mL/min — AB (ref >90)
GFR, EST NON AFRICAN AMERICAN: 42 mL/min — AB (ref >90)
GLUCOSE: 158 mg/dL — AB (ref 70–99)
Potassium: 3.7 mEq/L (ref 3.7–5.3)
Sodium: 137 mEq/L (ref 137–147)
Total Bilirubin: 0.4 mg/dL (ref 0.3–1.2)
Total Protein: 6.5 g/dL (ref 6.0–8.3)

## 2013-10-06 LAB — CLOSTRIDIUM DIFFICILE BY PCR: CDIFFPCR: NEGATIVE

## 2013-10-06 LAB — TSH: TSH: 1.224 u[IU]/mL (ref 0.350–4.500)

## 2013-10-06 LAB — TROPONIN I
Troponin I: 0.41 ng/mL (ref <0.30)
Troponin I: <0.3 ng/mL (ref <0.30)
Troponin I: 0.31 ng/mL (ref <0.30)

## 2013-10-06 LAB — IRON AND TIBC
Iron: <10 ug/dL — ABNORMAL LOW (ref 42–135)
UIBC: 232 ug/dL (ref 125–400)

## 2013-10-06 LAB — VITAMIN B12: VITAMIN B 12: 442 pg/mL (ref 211–911)

## 2013-10-06 LAB — LACTIC ACID, PLASMA: Lactic Acid, Venous: 0.8 mmol/L (ref 0.5–2.2)

## 2013-10-06 LAB — TRANSFERRIN: Transferrin: 190 mg/dL — ABNORMAL LOW (ref 200–360)

## 2013-10-06 MED ORDER — CIPROFLOXACIN IN D5W 400 MG/200ML IV SOLN
400.0000 mg | Freq: Two times a day (BID) | INTRAVENOUS | Status: DC
  Administered 2013-10-06 – 2013-10-07 (×2): 400 mg via INTRAVENOUS
  Filled 2013-10-06 (×7): qty 200

## 2013-10-06 MED ORDER — GLUCERNA SHAKE PO LIQD
237.0000 mL | Freq: Two times a day (BID) | ORAL | Status: DC
Start: 2013-10-06 — End: 2013-10-10
  Administered 2013-10-06 – 2013-10-10 (×8): 237 mL via ORAL
  Filled 2013-10-06 (×9): qty 237

## 2013-10-06 MED ORDER — INSULIN GLARGINE 100 UNIT/ML ~~LOC~~ SOLN
18.0000 [IU] | Freq: Every day | SUBCUTANEOUS | Status: DC
  Administered 2013-10-07: 18 [IU] via SUBCUTANEOUS
  Filled 2013-10-06 (×2): qty 0.18

## 2013-10-06 NOTE — Progress Notes (Signed)
INITIAL NUTRITION ASSESSMENT  DOCUMENTATION CODES Per approved criteria  -Not Applicable   INTERVENTION: -Recommend Glucerna Shake po BID, each supplement provides 220 kcal and 10 grams of protein -Encouraged consistent balanced meals for glucose control -Will continue to monitor   NUTRITION DIAGNOSIS: Inadequate oral intake related to decreased appetite as evidenced by PO intake <75%  Goal: Pt to meet >/= 90% of their estimated nutrition needs    Monitor:  Total protein/energy intake, glucose profile, labs, GI profile, labs  Reason for Assessment: MST  80 y.o. female  Admitting Dx: Diarrhea  ASSESSMENT: [name] is a 80 y.o. female with history of diabetes mellitus type 2, hypertension, hyperlipidemia, history of TIA, dementia and chronic anemia was brought to the ER patient had multiple episodes of diarrhea since last night. Patient also was feeling weak and has had 3 falls in her house. Patient during the falls as per the patient's daughter did not lose consciousness. She also had one large loose bowel movement in the ER which was nonbloody. Stool for blood was negative. Lactic acid was found to be elevated. Patient has been feeling extremely weak and tired. Otherwise patient denies any chest pain or shortness of breath. Per patient's family patient has been having poor appetite over the last 2 days.  -Pt reported intentional weight loss of approximately 35-40 lbs through diet modification and portion control.  Weight loss has been gradual, has happened over several years. Pt unable to quantify what year wt loss started.  -Pt's weight continues to declined as appetite has decreased. Pt lives with son, who assists in ensuring pt's nutrition. -Diet recall indicates pt will normally skip breakfast, and will prepare sandwich or eat leftovers for lunch. Son usually takes pt out to dinner for social interactions. Pt snacks on crackers or yogurts, or drinks Ensure occasionally  if pt does not feel like preparing lunch. -Did not eat breakfast d/t lack of appetite. Denied any nausea/vomiting.  Encouraged regular consistent meals for blood glucose control. Has hx of hyperglycemic and hypoglycemia r/t inconsistent meals -Has several episodes loose stools. C.diff sample pending  Nutrition Focused Physical Exam:  Subcutaneous Fat:  Orbital Region: WNL Upper Arm Region: WNL Thoracic and Lumbar Region: WNL  Muscle:  Temple Region: moderate Clavicle Bone Region: WNL Clavicle and Acromion Bone Region: WNL Scapular Bone Region: WNL Dorsal Hand: WNL Patellar Region: WNL Anterior Thigh Region: WNL Posterior Calf Region: WNL  Edema: N/A    Height: Ht Readings from Last 1 Encounters:  10/05/13 5\' 3"  (1.6 m)    Weight: Wt Readings from Last 1 Encounters:  10/05/13 140 lb 6.4 oz (63.685 kg)    Ideal Body Weight: 115 lbs  % Ideal Body Weight: 122%  Wt Readings from Last 10 Encounters:  10/05/13 140 lb 6.4 oz (63.685 kg)  10/04/13 144 lb (65.318 kg)  07/27/13 145 lb 8 oz (65.998 kg)  06/15/13 144 lb (65.318 kg)  06/02/13 142 lb (64.411 kg)  05/22/13 137 lb 12.8 oz (62.506 kg)  04/06/13 144 lb 4 oz (65.431 kg)  09/16/12 147 lb (66.679 kg)  07/29/12 150 lb (68.04 kg)  07/14/12 150 lb 4 oz (68.153 kg)    Usual Body Weight: unable to determine  % Usual Body Weight: unable to determine  BMI:  Body mass index is 24.88 kg/(m^2). Normal weight  Estimated Nutritional Needs: Kcal: 1400-1600 Protein: 60-70 gram Fluid: >/= 1900 ml  Skin: WDL  Diet Order: Cardiac  EDUCATION NEEDS: -Education needs addressed- encouraged consistent balanced  meals for CBG control   Intake/Output Summary (Last 24 hours) at 10/06/13 1150 Last data filed at 10/06/13 1108  Gross per 24 hour  Intake   1050 ml  Output   1650 ml  Net   -600 ml    Last BM: 1/29   Labs:   Recent Labs Lab 10/04/13 1108 10/05/13 1940 10/06/13 0310  NA 137 137 137  K 4.0 4.2 3.7   CL 102 98 101  CO2 27 22 24   BUN 22 23 18   CREATININE 1.2 1.37* 1.18*  CALCIUM 9.6 8.8 8.3*  GLUCOSE 42* 334* 158*    CBG (last 3)   Recent Labs  10/06/13 0814 10/06/13 1120  GLUCAP 106* 145*    Scheduled Meds: . amLODipine  10 mg Oral Daily  . atorvastatin  10 mg Oral Daily  . ciprofloxacin  400 mg Intravenous Q12H  . clopidogrel  75 mg Oral Daily  . feeding supplement (GLUCERNA SHAKE)  237 mL Oral BID BM  . insulin aspart  0-9 Units Subcutaneous TID WC  . insulin glargine  20 Units Subcutaneous QHS  . loratadine  10 mg Oral Daily  . Memantine HCl ER  28 mg Oral Daily  . metronidazole  500 mg Intravenous Q8H  . sodium chloride  3 mL Intravenous Q12H    Continuous Infusions: . sodium chloride 100 mL/hr at 10/05/13 2354    Past Medical History  Diagnosis Date  . COLONIC POLYPS 06/15/2005  . DIABETES MELLITUS, TYPE II 04/22/2007  . HYPERCHOLESTEROLEMIA   . HYPERLIPIDEMIA   . OBESITY   . ANEMIA-IRON DEFICIENCY   . DEPRESSION   . HYPERTENSION   . ALLERGIC RHINITIS   . ESOPHAGEAL STRICTURE   . GERD   . HIATAL HERNIA   . DIVERTICULOSIS, COLON   . BACK PAIN   . OSTEOPOROSIS   . DIZZINESS   . WEIGHT LOSS   . DYSPNEA   . Other dysphagia   . COLONIC POLYPS, HX OF 04/03/2002 & 06/15/2005    TUBULAR ADENOMA  . History of CVA (cerebrovascular accident) 07/29/2012    Noted old, to right thalamus and pons by Head CT Jul 27, 2012  . Dementia 09/17/2012  . Type II or unspecified type diabetes mellitus without mention of complication, uncontrolled 07/16/2012    Past Surgical History  Procedure Laterality Date  . Appendectomy    . Abdominal hysterectomy    . Tonsillectomy and adenoidectomy    . Left ankle surgury    . S/p bladder pubovaginal sling    . S/p cystocele/rectocele repair    . Laparoscopic takedown of incarcerated stomach within the chest/nissen  2009  . Esophageal manometry  12/21/2011    Procedure: ESOPHAGEAL MANOMETRY (EM);  Surgeon: Sable Feil,  MD;  Location: [facility];  Service: Endoscopy;  Laterality: N/A;    Atlee Abide MS RD LDN Clinical Dietitian [phone]

## 2013-10-06 NOTE — Progress Notes (Signed)
Pt admitted tonight by [facility] secondary to weakness thought to be secondary to diarrhea and UTI. EKG had ST depression but was comparable to an old EKG per admitting doc. Troponins cycling and first troponin was positive at 0.41. The pt is having NO chest pain and hasn't had any complaints of chest pain at all since arrival to hospital. At this point, we will continue to cycle troponins and watch. I have asked RN to call should pt complain of any chest pain. Pt is on Plavix chronically.  Clance Boll, NP [facility]

## 2013-10-06 NOTE — Progress Notes (Signed)
ANTIBIOTIC CONSULT NOTE - FOLLOW UP  Pharmacy Consult for Ciprofloxacin Indication: UTI/Diarrhea  Allergies  Allergen Reactions  . Keflex [Cephalexin]   . Codeine Nausea And Vomiting and Rash  . Hydrocodone-Acetaminophen Nausea And Vomiting and Rash  . Oxycodone-Acetaminophen Nausea And Vomiting and Rash    Patient Measurements: Height: 5\' 3"  (160 cm) Weight: 140 lb 6.4 oz (63.685 kg) IBW/kg (Calculated) : 52.4 Adjusted Body Weight:   Vital Signs: Temp: 98.8 F (37.1 C) (01/30 0526) Temp src: Oral (01/30 0526) BP: 123/46 mmHg (01/30 0526) Pulse Rate: 89 (01/30 0526) Intake/Output from previous day: 01/29 0701 - 01/30 0700 In: 810 [I.V.:710; IV Piggyback:100] Out: 1550 [Urine:1550] Intake/Output from this shift:    Labs:  Recent Labs  10/04/13 1108 10/05/13 1940 10/06/13 0310  WBC 12.0* 17.7* 13.9*  HGB 9.4* 8.5* 7.5*  PLT 334.0 293 276  CREATININE 1.2 1.37* 1.18*   Estimated Creatinine Clearance: 34.2 ml/min (by C-G formula based on Cr of 1.18). No results found for this basename: VANCOTROUGH, VANCOPEAK, VANCORANDOM, GENTTROUGH, GENTPEAK, GENTRANDOM, TOBRATROUGH, TOBRAPEAK, TOBRARND, AMIKACINPEAK, AMIKACINTROU, AMIKACIN,  in the last 72 hours   Microbiology: No results found for this or any previous visit (from the past 720 hour(s)).  Anti-infectives   Start     Dose/Rate Route Frequency Ordered Stop   10/06/13 2200  ciprofloxacin (CIPRO) IVPB 400 mg     400 mg 200 mL/hr over 60 Minutes Intravenous Every 24 hours 10/05/13 2337     10/06/13 0600  metroNIDAZOLE (FLAGYL) IVPB 500 mg     500 mg 100 mL/hr over 60 Minutes Intravenous Every 8 hours 10/05/13 2311     10/05/13 2200  ciprofloxacin (CIPRO) IVPB 400 mg     400 mg 200 mL/hr over 60 Minutes Intravenous  Once 10/05/13 2131 10/06/13 0011   10/05/13 2130  metroNIDAZOLE (FLAGYL) IVPB 500 mg     500 mg 100 mL/hr over 60 Minutes Intravenous  Once 10/05/13 2128 10/05/13 2243      Assessment: 5 yoF with  PMHx DM, HTN, HLD, TIA, chronic anemia presents with diarrhea and falls. Received macrobid x 1 yesterday.   Pharmacy consulted to dose ciprofloxacin for UTI.  Pt also started on flagyl for empiric CDiff.  Renal function has slightly improved therefore will increase ciprofloxacin dose to BID.  Noted pt also with an elevated troponin.    D1 Antibiotics 1/29 >> Ciprofloxacin >> 1/29 >> Flagyl >>  Tm24h: Afebrile WBC: Decreasing to 13.9 Renal: SCr improved to 1.18, CrCl 34  Microbiology 1/29: CDiff collected Urine: ordered Stool culture: ordered  Goal of Therapy:  Eradication of infection  Plan:  Increase to ciprofloxacin 400mg  IV q 12 h  Ralene Bathe, PharmD, BCPS 10/06/2013, 8:19 AM  Pager: [phone]

## 2013-10-06 NOTE — Progress Notes (Signed)
CRITICAL VALUE ALERT  Critical value received: Troponin 0.31  Date of notification:  10/06/13  Time of notification:  6295  Critical value read back:yes  Nurse who received alert:  Adelfa Koh  MD notified (1st page):  Dr. Sheran Fava  Time of first page:  1425  MD notified (2nd page):  Time of second page:  Responding MD:  DR. Sheran Fava  Time MD responded:  [phone]

## 2013-10-06 NOTE — Evaluation (Signed)
Occupational Therapy Evaluation Patient Details Name: [name] MRN: [id number] DOB: Mar 15, 1931 Today's Date: 10/06/2013 Time: 6237-6283 OT Time Calculation (min): 22 min  OT Assessment / Plan / Recommendation History of present illness pt was admitted for nausea/vomiting and diarrhea   Clinical Impression   Pt was admitted for the above.  She will benefit from skilled OT to increase safety and independence with adls.  Goals in acute are for supervision level.      OT Assessment  Patient needs continued OT Services    Follow Up Recommendations  Supervision/Assistance - 24 hour (HH vs none, depending upon progress)    Barriers to Discharge      Equipment Recommendations   (likely none (has standard commode))    Recommendations for Other Services    Frequency  Min 2X/week    Precautions / Restrictions Precautions Precautions: Fall Restrictions Weight Bearing Restrictions: No   Pertinent Vitals/Pain No c/o pain    ADL  Grooming: Brushing hair Where Assessed - Grooming: Unsupported sitting Upper Body Bathing: Set up Where Assessed - Upper Body Bathing: Unsupported sitting Lower Body Bathing: Min guard Where Assessed - Lower Body Bathing: Supported sit to stand Upper Body Dressing: Minimal assistance (iv) Where Assessed - Upper Body Dressing: Unsupported sitting Lower Body Dressing: Min guard Where Assessed - Lower Body Dressing: Supported sit to Lobbyist: Magazine features editor Method: Squat pivot Toileting - Water quality scientist and Hygiene: Min guard Where Assessed - Best boy and Hygiene: Lean right and/or left Equipment Used: Rolling walker Transfers/Ambulation Related to ADLs: SPT to recliner, moves quickly.  Used RW.  Pt usually uses cane ADL Comments: Min guard for balance with adls.      OT Diagnosis: Generalized weakness  OT Problem List: Decreased strength;Decreased activity tolerance;Impaired balance (sitting  and/or standing) OT Treatment Interventions: Self-care/ADL training;DME and/or AE instruction;Patient/family education;Balance training   OT Goals(Current goals can be found in the care plan section) Acute Rehab OT Goals Patient Stated Goal: none stated; agreeable to OT OT Goal Formulation: With patient Time For Goal Achievement: 10/20/13 Potential to Achieve Goals: Good ADL Goals Pt Will Perform Lower Body Bathing: with supervision;sit to/from stand Pt Will Perform Lower Body Dressing: with supervision;sit to/from stand Pt Will Transfer to Toilet: with supervision;ambulating;regular height toilet Pt Will Perform Toileting - Clothing Manipulation and hygiene: with supervision;sit to/from stand  Visit Information  Last OT Received On: 10/06/13 Assistance Needed: +1 History of Present Illness: pt was admitted for nausea/vomiting and diarrhea       Prior [place] expects to be discharged to:: Private residence Living Arrangements: [place];Shower seat Prior Function Level of Independence: Independent with assistive device(s) Communication Communication: No difficulties         Vision/Perception     Cognition  Cognition Arousal/Alertness: Awake/alert Behavior During Therapy: WFL for tasks assessed/performed Overall Cognitive Status: No family/caregiver present to determine baseline cognitive functioning (wfls for eval)    Extremity/Trunk Assessment Upper Extremity Assessment Upper Extremity Assessment: Overall WFL for tasks assessed     Mobility Bed Mobility Overal bed mobility: Independent General bed mobility comments: from flat bed Transfers Overall transfer level: Needs assistance Equipment used: Rolling walker (2 wheeled) Transfers: Sit to/from Stand Sit to Stand: Min guard General transfer comment: cues for hand placement: pt is used to cane     Exercise     Balance     End of  Session OT -  End of Session Activity Tolerance: Patient tolerated treatment well Patient left: in chair;with call bell/phone within reach;with chair alarm set  [place] 10/06/2013, 4:25 PM Lesle Chris, OTR/L [phone] 10/06/2013

## 2013-10-06 NOTE — Progress Notes (Signed)
[facility] PROGRESS NOTE  BRICELYN FREESTONE BMW:[id number] DOB: 09-Dec-1930 DOA: 10/05/2013 PCP: Cathlean Cower, MD  Assessment/Plan  Nausea vomiting and diarrhea with severe diarrhea -  C. Diff negative -  GI pathogen panel and stool culture have not been drawn yet -  Diarrhea resolving, however  Acute renal failure and Dehydration from diarrhea, resolving with hydration -  Continue IVF -  Hold ARB -  Renally dose medications -  Minimize nephrotoxins  Elevated lactic acid level - probably from dehydration and resolved with IVF   UTI - follow urine cultures. Patient is on Cipro.  Weakness with falls - CT head and x-ray of the pelvis are unremarkable. -  PT/OT assessment  Diabetes mellitus type 2, CBG well controlled, but given recent hypoglycemia, will try to aim slightly higher CBGs -  Decrease lantus to 18 units -  Continue low dose SSI  Hypertension  - continue amlodipine.  -  Continue prn hydralazine  Acute on chronic anemia, unclear etiology -  Initial Stool for occult blood is negative. -  Repeat occult stoo -  Iron studies, b12, folate -  Defer TSH due to acute illness  History of TIA - continue Plavix.  Leukocytosis probably from diarrhea and UTI and resolving  ST depression in the lateral leads of the EKG, stable.  Patient chest pain free.  Initial troponin mildly elevated, likely some demand ischemia plus renal insufificiency -  Cycle troponins until stable -  ECHO -  Cardiology follow up as outpatient -  Continue plavix  Delirium with underlying dementia, resolving.   -  Continue memantine  Diet:  Healthy heart Access:  PIV IVF:  yes Proph:  SCDs  Code Status: DNR Family Communication: patient and daughter Disposition Plan: pending ECHO, troponins stable, eating and drinking okay, diarrhea resolving   Consultants:  None  Procedures:  CT head  Pelvis XR  Antibiotics:  cipro 1/29 >>  Flagyl 1/29 >>   HPI/Subjective:  Patient  denies chest pain, SOB, cough.  Last diarrhea was on the way to the hospital, none since.    Objective: Filed Vitals:   10/06/13 0526 10/06/13 0931 10/06/13 1329 10/06/13 1336  BP: 123/46 136/56 124/88 126/49  Pulse: 89  84 60  Temp: 98.8 F (37.1 C)  98.6 F (37 C) 98.5 F (36.9 C)  TempSrc: Oral  Oral Oral  Resp: 18  19 18   Height:      Weight:      SpO2: 96%  98% 99%    Intake/Output Summary (Last 24 hours) at 10/06/13 1509 Last data filed at 10/06/13 1108  Gross per 24 hour  Intake   1050 ml  Output   1650 ml  Net   -600 ml   Filed Weights   10/05/13 2233  Weight: 63.685 kg (140 lb 6.4 oz)    Exam:   General:  CF, No acute distress  HEENT:  NCAT, MMM  Cardiovascular:  RRR, nl S1, S2 no mrg, 2+ pulses, warm extremities  Respiratory:  CTAB, no increased WOB  Abdomen:   NABS, soft, NT/ND  MSK:   Normal tone and bulk, no LEE  Neuro:  Grossly intact  Psych:  able to give some history, however, confused about day of week and timing of events  Data Reviewed: Basic Metabolic Panel:  Recent Labs Lab 10/04/13 1108 10/05/13 1940 10/06/13 0310  NA 137 137 137  K 4.0 4.2 3.7  CL 102 98 101  CO2 27 22 24   GLUCOSE  42* 334* 158*  BUN 22 23 18   CREATININE 1.2 1.37* 1.18*  CALCIUM 9.6 8.8 8.3*   Liver Function Tests:  Recent Labs Lab 10/04/13 1108 10/05/13 1940 10/06/13 0310  AST 23 24 23   ALT 19 17 14   ALKPHOS 57 59 54  BILITOT 0.4 0.3 0.4  PROT 7.9 7.6 6.5  ALBUMIN 3.8 3.4* 2.8*   No results found for this basename: LIPASE, AMYLASE,  in the last 168 hours No results found for this basename: AMMONIA,  in the last 168 hours CBC:  Recent Labs Lab 10/04/13 1108 10/05/13 1940 10/06/13 0310  WBC 12.0* 17.7* 13.9*  NEUTROABS 8.9* 15.0* 11.1*  HGB 9.4* 8.5* 7.5*  HCT 28.9* 26.2* 23.3*  MCV 89.0 89.1 88.9  PLT 334.0 293 276   Cardiac Enzymes:  Recent Labs Lab 10/06/13 0350 10/06/13 0823 10/06/13 1322  TROPONINI 0.41* <0.30 0.31*    BNP (last 3 results)  Recent Labs  05/22/13 1640  PROBNP 173.3   CBG:  Recent Labs Lab 10/06/13 0814 10/06/13 1120  GLUCAP 106* 145*    Recent Results (from the past 240 hour(s))  CLOSTRIDIUM DIFFICILE BY PCR     Status: None   Collection Time    10/05/13  7:48 PM      Result Value Range Status   C difficile by pcr NEGATIVE  NEGATIVE Final   Comment: Performed at [facility]     Studies: Dg Pelvis 1-2 Views  10/06/2013   CLINICAL DATA:  Fall, hip pain  EXAM: PELVIS - 1-2 VIEW  COMPARISON:  None.  FINDINGS: Diffuse degenerative changes of the lower lumbar spine with an associated scoliosis as before. Mild SI joint arthropathy. Bones are osteopenic. Pelvis and hips appear symmetric and intact. No displaced fracture.  IMPRESSION: Degenerative changes of the spine.  Osteopenia.  No acute displaced fracture   Electronically Signed   By: Daryll Brod M.D.   On: 10/06/2013 00:01   Ct Head Wo Contrast  10/06/2013   CLINICAL DATA:  Fall, confusion, hypertension, diabetes, dementia, CVA 2013.  EXAM: CT HEAD WITHOUT CONTRAST  TECHNIQUE: Contiguous axial images were obtained from the base of the skull through the vertex without intravenous contrast.  COMPARISON:  05/22/2013 MRI, 05/22/2013 CT  FINDINGS: Similar prominence of the sulci, cisterns, and ventricles, in keeping with volume loss. Periventricular and subcortical white matter hypodensities are similar to prior and in keeping with chronic microangiopathic change. A remote right thalamic lacunar infarction is suspected. No CT evidence of a cortical based (large artery) infarct. No intraparenchymal hemorrhage, mass, mass effect, or abnormal extra-axial fluid collection. No hydrocephalus. Atherosclerotic vascular calcifications. Bilateral maxillary sinus mucosal thickening and mucous retention cysts. Mastoid air cells are clear. No displaced calvarial fracture.  IMPRESSION: Volume loss and white matter changes are similar to prior.  Remote right thalamic lacunar infarction. No CT evidence of acute intracranial abnormality.   Electronically Signed   By: Carlos Levering M.D.   On: 10/06/2013 00:01    Scheduled Meds: . amLODipine  10 mg Oral Daily  . atorvastatin  10 mg Oral Daily  . ciprofloxacin  400 mg Intravenous Q12H  . clopidogrel  75 mg Oral Daily  . feeding supplement (GLUCERNA SHAKE)  237 mL Oral BID BM  . insulin aspart  0-9 Units Subcutaneous TID WC  . insulin glargine  20 Units Subcutaneous QHS  . loratadine  10 mg Oral Daily  . Memantine HCl ER  28 mg Oral Daily  . metronidazole  500 mg Intravenous Q8H  . sodium chloride  3 mL Intravenous Q12H   Continuous Infusions: . sodium chloride 100 mL/hr at 10/06/13 1331    Principal Problem:   Diarrhea Active Problems:   Type 2 diabetes, uncontrolled, with neuropathy   Dementia   Elevated lactic acid level   Dehydration   ARF (acute renal failure)   UTI (lower urinary tract infection)    Time spent: 30 min    Jerremy Maione, [place]  [facility] Pager [phone]. If 7PM-7AM, please contact night-coverage at [email or web address], password [facility] 10/06/2013, 3:09 PM  LOS: 1 day

## 2013-10-06 NOTE — Progress Notes (Signed)
Echo Lab  2D Echocardiogram completed.  [place], [place] 10/06/2013 3:30 PM

## 2013-10-06 NOTE — Progress Notes (Signed)
Patient urine output 100cc over 8hours, bladder scan for 812cc, patient denies any pain/distress, notified Dr. Sheran Fava and she stated to place foley cath.

## 2013-10-06 NOTE — Progress Notes (Signed)
Inpatient Diabetes Program Recommendations  AACE/ADA: New Consensus Statement on Inpatient Glycemic Control (2013)  Target Ranges:  Prepandial:   less than 140 mg/dL      Peak postprandial:   less than 180 mg/dL (1-2 hours)      Critically ill patients:  140 - 180 mg/dL   Reason for Visit: Hyperglycemia  Diabetes history: Type 2 Outpatient Diabetes medications: Lantus 40 units QAM and metformin 500 bid Current orders for Inpatient glycemic control: Lantus 20 units QHS and Novolog sensitive tidwc  Results for SHAWNYA, MAYOR (MRN [id number]) as of 10/06/2013 12:02  Ref. Range 10/06/2013 08:14 10/06/2013 11:20  Glucose-Capillary Latest Range: 70-99 mg/dL 106 (H) 145 (H)   Results for CIELLE, AGUILA (MRN [id number]) as of 10/06/2013 12:02  Ref. Range 10/05/2013 19:40 10/06/2013 03:10  Glucose Latest Range: 70-99 mg/dL 334 (H) 158 (H)    Blood sugars much improved. Agree with 1/2 home dose of Lantus.  Please add CHO mod med to heart healthy diet. Will continue to follow. Thank you. Lorenda Peck, RD, LDN, CDE Inpatient Diabetes Coordinator [phone]

## 2013-10-06 NOTE — Care Management Note (Addendum)
    Page 1 of 2   10/10/2013     10:56:03 AM   CARE MANAGEMENT NOTE 10/10/2013  Patient:  [name], [name]   Account Number:  [id number]  Date Initiated:  10/06/2013  Documentation initiated by:  [facility]  Subjective/Objective Assessment:   80 Y/O F ADMITTED W/DIARRHEA.UTI.KP:TWSFKCLE.     Action/Plan:   FROM HOME W/SON,& DTR-IN-LAW.HAS PCP,PHARMACY.   Anticipated DC Date:  10/10/2013   Anticipated DC Plan:  [place]  CM consult      Choice offered to / List presented to:  C-1 Patient        [place] arranged  HH-1 RN  [place].   Status of service:  Completed, signed off Medicare Important Message given?   (If response is "NO", the following Medicare IM given date fields will be blank) Date Medicare IM given:   Date Additional Medicare IM given:    Discharge Disposition:  [place]  Per UR Regulation:  Reviewed for med. necessity/level of care/duration of stay  If discussed at [place] of Stay Meetings, dates discussed:   10/10/2013    Comments:  10/10/13 Cherith Tewell RN,BSN NCM [phone] URINE RETENTION-F/C PLACED.FOR D/C HOME [facility] & WITH F/C.[facility] REP KRISTEN AWARE OF D/C TODAY.ALREADY HAS HHC ORDERS.  10/09/13 Keiondra Brookover RN,BSN NCM [place] THAT PATIENT GOING HOME W/F/C.TC KRISTEN [facility] REP,LEFT MESSAGE THAT PATIENT FOR D/C HOME W/F/C.NURSE TO PROVIDE PATIENT W/AN EXTRA F/C SET.[facility] WILL MAKE VISIT WITHIN 24-48HRS. [facility] CHOSEN FOR HHC.TC KRISTEN AWARE OF HHC ORDERS, & D/C TODAY.NO HOME 02 NEEDED OR ORDERED.  10/06/13 Ulyana Pitones RN,BSN NCM [phone] AWAIT PT/OT RECOMMENDATIONS.

## 2013-10-06 NOTE — Progress Notes (Signed)
CRITICAL VALUE ALERT  Critical value received:  Troponin - 0.41  Date of notification:  10/06/13  Time of notification:  6606  Critical value read back:yes  Nurse who received alert:  Bayard Males RN  MD notified (1st page):  Tylene Fantasia  Time of first page:  [phone]  MD notified (2nd page):  Time of second page:  Responding MD:  Tylene Fantasia   Time MD responded:  [phone]

## 2013-10-07 ENCOUNTER — Inpatient Hospital Stay (HOSPITAL_COMMUNITY): Payer: Medicare Other

## 2013-10-07 DIAGNOSIS — R0989 Other specified symptoms and signs involving the circulatory and respiratory systems: Secondary | ICD-10-CM

## 2013-10-07 DIAGNOSIS — J383 Other diseases of vocal cords: Secondary | ICD-10-CM

## 2013-10-07 DIAGNOSIS — J189 Pneumonia, unspecified organism: Secondary | ICD-10-CM

## 2013-10-07 DIAGNOSIS — R0609 Other forms of dyspnea: Secondary | ICD-10-CM

## 2013-10-07 LAB — GLUCOSE, CAPILLARY
GLUCOSE-CAPILLARY: 144 mg/dL — AB (ref 70–99)
GLUCOSE-CAPILLARY: 73 mg/dL (ref 70–99)
GLUCOSE-CAPILLARY: 157 mg/dL — AB (ref 70–99)
Glucose-Capillary: 99 mg/dL (ref 70–99)

## 2013-10-07 LAB — URINE CULTURE
CULTURE: NO GROWTH
Colony Count: NO GROWTH

## 2013-10-07 LAB — BASIC METABOLIC PANEL
BUN: 11 mg/dL (ref 6–23)
CHLORIDE: 103 meq/L (ref 96–112)
CO2: 25 mEq/L (ref 19–32)
Calcium: 8.5 mg/dL (ref 8.4–10.5)
Creatinine, Ser: 1.11 mg/dL — ABNORMAL HIGH (ref 0.50–1.10)
GFR calc non Af Amer: 46 mL/min — ABNORMAL LOW (ref >90)
GFR, EST AFRICAN AMERICAN: 53 mL/min — AB (ref >90)
Glucose, Bld: 57 mg/dL — ABNORMAL LOW (ref 70–99)
Potassium: 3.6 mEq/L — ABNORMAL LOW (ref 3.7–5.3)
SODIUM: 138 meq/L (ref 137–147)

## 2013-10-07 LAB — CBC
HEMATOCRIT: 24.9 % — AB (ref 36.0–46.0)
HEMOGLOBIN: 7.9 g/dL — AB (ref 12.0–15.0)
MCH: 28.4 pg (ref 26.0–34.0)
MCHC: 31.7 g/dL (ref 30.0–36.0)
MCV: 89.6 fL (ref 78.0–100.0)
Platelets: 288 10*3/uL (ref 150–400)
RBC: 2.78 MIL/uL — AB (ref 3.87–5.11)
RDW: 14 % (ref 11.5–15.5)
WBC: 10.7 10*3/uL — AB (ref 4.0–10.5)

## 2013-10-07 LAB — FOLATE RBC: RBC Folate: 1179 ng/mL — ABNORMAL HIGH (ref >280)

## 2013-10-07 LAB — STREP PNEUMONIAE URINARY ANTIGEN: Strep Pneumo Urinary Antigen: NEGATIVE

## 2013-10-07 MED ORDER — CIPROFLOXACIN HCL 500 MG PO TABS
500.0000 mg | ORAL_TABLET | Freq: Two times a day (BID) | ORAL | Status: DC
  Filled 2013-10-07 (×2): qty 1

## 2013-10-07 MED ORDER — FERROUS SULFATE 325 (65 FE) MG PO TABS
325.0000 mg | ORAL_TABLET | Freq: Three times a day (TID) | ORAL | Status: DC
  Administered 2013-10-07 – 2013-10-10 (×9): 325 mg via ORAL
  Filled 2013-10-07 (×13): qty 1

## 2013-10-07 MED ORDER — INSULIN GLARGINE 100 UNIT/ML ~~LOC~~ SOLN
10.0000 [IU] | Freq: Every day | SUBCUTANEOUS | Status: DC
  Administered 2013-10-07 – 2013-10-09 (×3): 10 [IU] via SUBCUTANEOUS
  Filled 2013-10-07 (×4): qty 0.1

## 2013-10-07 MED ORDER — IPRATROPIUM-ALBUTEROL 0.5-2.5 (3) MG/3ML IN SOLN
3.0000 mL | Freq: Three times a day (TID) | RESPIRATORY_TRACT | Status: DC
  Administered 2013-10-07 (×2): 3 mL via RESPIRATORY_TRACT
  Filled 2013-10-07 (×2): qty 3

## 2013-10-07 MED ORDER — LEVOFLOXACIN 750 MG PO TABS
750.0000 mg | ORAL_TABLET | ORAL | Status: DC
  Administered 2013-10-07 – 2013-10-09 (×2): 750 mg via ORAL
  Filled 2013-10-07 (×2): qty 1

## 2013-10-07 MED ORDER — FERROUS SULFATE 325 (65 FE) MG PO TABS
325.0000 mg | ORAL_TABLET | Freq: Every day | ORAL | Status: DC
Start: 2013-10-07 — End: 2013-10-07

## 2013-10-07 MED ORDER — LEVOFLOXACIN IN D5W 750 MG/150ML IV SOLN
750.0000 mg | INTRAVENOUS | Status: DC
  Filled 2013-10-07: qty 150

## 2013-10-07 NOTE — Progress Notes (Signed)
ANTIBIOTIC CONSULT NOTE - Initial  Pharmacy Consult for levofloxacin Indication: pneumonia  Allergies  Allergen Reactions  . Keflex [Cephalexin]   . Codeine Nausea And Vomiting and Rash  . Hydrocodone-Acetaminophen Nausea And Vomiting and Rash  . Oxycodone-Acetaminophen Nausea And Vomiting and Rash    Patient Measurements: Height: 5\' 3"  (160 cm) Weight: 140 lb 6.4 oz (63.685 kg) IBW/kg (Calculated) : 52.4  Vital Signs: Temp: 98 F (36.7 C) (01/31 1551) Temp src: Oral (01/31 1551) BP: 128/37 mmHg (01/31 1551) Pulse Rate: 95 (01/31 1551) Intake/Output from previous day: 01/30 0701 - 01/31 0700 In: 1803.3 [P.O.:240; I.V.:1163.3; IV Piggyback:400] Out: 1500 [Urine:1500] Intake/Output from this shift: Total I/O In: 360 [P.O.:360] Out: 1200 [Urine:1200]  Labs:  Recent Labs  10/05/13 1940 10/06/13 0310 10/07/13 0347  WBC 17.7* 13.9* 10.7*  HGB 8.5* 7.5* 7.9*  PLT 293 276 288  CREATININE 1.37* 1.18* 1.11*   Estimated Creatinine Clearance: 36.3 ml/min (by C-G formula based on Cr of 1.11).   Assessment: 75 yoF with PMHx DM, HTN, HLD, TIA, chronic anemia admitted 1/29 with diarrhea and falls.   Pharmacy was originally consulted to dose ciprofloxacin for UTI/diarrhea.  Pt was started on flagyl for empiric CDiff.  Ciprofloxacin is now being transitioned to levofloxacin to cover for multifocal atypical pneumonia  Noted patient without IV access and IV access will not be obtained again  D3 Antibiotics 1/29 >> Ciprofloxacin >> 1/31 1/29 >> Flagyl >> 1/30 1/31 >> levofloxacin PO >>  Tm24h: Afebrile WBC: Decreasing 13.9-> 10.7 Renal: SCr improved to 1.11, CrCl ~ 36 ml/min CG, 46 ml/min normalized  Microbiology 1/29 CDiff: negative 1/29 urine: NGF 1/30 Stool culture: ordered 1/31 blood x2: ordered 1/31 S.pneumo Ur Ag: ordered 1/31 Legionella Ur Ag: ordered   Goal of Therapy:  Eradication of infection  Plan:  - discontinue ciprofloxacin - start levofloxacin  750mg  PO q48 hours - follow-up clinical course, culture results, renal function - follow-up antibiotic de-escalation and length of therapy  Thank you for the consult.  Johny Drilling, PharmD, BCPS Clinical Pharmacist Pager: [phone] Pharmacy: [phone] 10/07/2013 4:39 PM

## 2013-10-07 NOTE — Progress Notes (Addendum)
[facility] PROGRESS NOTE  LEIRA REGINO ASN:[id number] DOB: 1931/02/16 DOA: 10/05/2013 PCP: Cathlean Cower, MD  Assessment/Plan  Nausea vomiting and diarrhea with severe diarrhea, resolved, but not eating well  -  C. Diff negative -  GI pathogen panel and stool culture have not been drawn yet because no further stools since admission  Acute renal failure and Dehydration from diarrhea, resolved with hydration -  D/c  -  Hold ARB -  Renally dose medications -  Minimize nephrotoxins  Elevated lactic acid level - probably from dehydration and resolved with IVF   Concern for UTI:  UCx negative but obtained post administration of anx -  D/c ciprofloxacin and start levofloxacin  Acute hypoxic respiratory failure with wheezing due to CAP and known vocal cord dysfunction.  No history of asthma or COPD although does have distant smoking history.   -  CXR:  Multifocal pneumonia -  Start levofloxacin for atypical CAP -  Blood culture x 2 -  Urine legionella and s. pneumo -  Trial of duonebs -  If fever, check flu  -  ECHO demonstrated normal EF with no evidence of diastolic heart failure.   -  DME home oxygen  Weakness with falls - CT head and x-ray of the pelvis are unremarkable. -  PT/OT assessment  Diabetes mellitus type 2, CBG well controlled, but given recent hypoglycemia, will try to aim slightly higher CBGs -  Decrease lantus to 18 units -  Continue low dose SSI  Hypertension, bp stable - continue amlodipine.  -  Continue prn hydralazine  Acute on chronic anemia due to iron deficiency and chronic disease -  Initial Stool for occult blood is negative. -  Repeat occult stoo -  Iron studies suggestive of iron deficiency and chronic disease, b12 442, folate pending -  Start iron supplementation -  Defer TSH due to acute illness  History of TIA - continue Plavix.  Leukocytosis probably from diarrhea and resolving  ST depression in the lateral leads of the EKG, stable.   Patient chest pain free.  Initial troponin mildly elevated, likely some demand ischemia plus renal insufificiency -  troponins now consistently negative -  ECHO without focal wall motion abnl -  Tele:  NSR, okay to d/c telemetry -  Continue plavix  Delirium with underlying dementia due to acute illness.  Continue memantine  Acute urinary retention -  D/c foley -  Check PVR in 4 hours and replace foley as needed  Diet:  Healthy heart Access:  PIV IVF:  yes Proph:  SCDs  Code Status: DNR Family Communication: patient and daughter Disposition Plan: pending improved breathing   Consultants:  None  Procedures:  CT head  Pelvis XR  Antibiotics:  cipro 1/29 >>  Flagyl 1/29 >>   HPI/Subjective:  + SOB and cough.  No further diarrhea.  Trying to get out of bed and confused overnight.  No diarrhea  Objective: Filed Vitals:   10/06/13 2117 10/07/13 0525 10/07/13 1025 10/07/13 1442  BP: 133/46 143/55  133/46  Pulse: 90 86 88 89  Temp: 99.5 F (37.5 C) 98.6 F (37 C)  98.4 F (36.9 C)  TempSrc: Oral Oral  Oral  Resp: 20 20  18   Height:      Weight:      SpO2: 95% 93% 94% 93%    Intake/Output Summary (Last 24 hours) at 10/07/13 1444 Last data filed at 10/07/13 1300  Gross per 24 hour  Intake 1723.33 ml  Output   2600 ml  Net -876.67 ml   Filed Weights   10/05/13 2233  Weight: 63.685 kg (140 lb 6.4 oz)    Exam:   General:  CF, No acute distress  HEENT:  NCAT, MMM  Cardiovascular:  RRR, nl S1, S2 no mrg, 2+ pulses, warm extremities  Respiratory:  CTAB, no increased WOB  Abdomen:   NABS, soft, NT/ND  MSK:   Normal tone and bulk, no LEE  Neuro:  Grossly intact  Psych:  Mildly confused but pleasant  Data Reviewed: Basic Metabolic Panel:  Recent Labs Lab 10/04/13 1108 10/05/13 1940 10/06/13 0310 10/07/13 0347  NA 137 137 137 138  K 4.0 4.2 3.7 3.6*  CL 102 98 101 103  CO2 27 22 24 25   GLUCOSE 42* 334* 158* 57*  BUN 22 23 18 11    CREATININE 1.2 1.37* 1.18* 1.11*  CALCIUM 9.6 8.8 8.3* 8.5   Liver Function Tests:  Recent Labs Lab 10/04/13 1108 10/05/13 1940 10/06/13 0310  AST 23 24 23   ALT 19 17 14   ALKPHOS 57 59 54  BILITOT 0.4 0.3 0.4  PROT 7.9 7.6 6.5  ALBUMIN 3.8 3.4* 2.8*   No results found for this basename: LIPASE, AMYLASE,  in the last 168 hours No results found for this basename: AMMONIA,  in the last 168 hours CBC:  Recent Labs Lab 10/04/13 1108 10/05/13 1940 10/06/13 0310 10/07/13 0347  WBC 12.0* 17.7* 13.9* 10.7*  NEUTROABS 8.9* 15.0* 11.1*  --   HGB 9.4* 8.5* 7.5* 7.9*  HCT 28.9* 26.2* 23.3* 24.9*  MCV 89.0 89.1 88.9 89.6  PLT 334.0 293 276 288   Cardiac Enzymes:  Recent Labs Lab 10/06/13 0350 10/06/13 0823 10/06/13 1322 10/06/13 1935  TROPONINI 0.41* <0.30 0.31* <0.30   BNP (last 3 results)  Recent Labs  05/22/13 1640  PROBNP 173.3   CBG:  Recent Labs Lab 10/06/13 1120 10/06/13 1655 10/06/13 2136 10/07/13 0745 10/07/13 1226  GLUCAP 145* 207* 144* 73 157*    Recent Results (from the past 240 hour(s))  CLOSTRIDIUM DIFFICILE BY PCR     Status: None   Collection Time    10/05/13  7:48 PM      Result Value Range Status   C difficile by pcr NEGATIVE  NEGATIVE Final   Comment: Performed at [place]     Status: None   Collection Time    10/05/13  9:11 PM      Result Value Range Status   Specimen Description URINE, CATHETERIZED   Final   Special Requests NONE   Final   Culture  Setup Time     Final   Value: 10/06/2013 10:56     Performed at [place]     Final   Value: NO GROWTH     Performed at [facility]   Culture     Final   Value: NO GROWTH     Performed at [facility]   Report Status 10/07/2013 FINAL   Final     Studies: Dg Pelvis 1-2 Views  10/06/2013   CLINICAL DATA:  Fall, hip pain  EXAM: PELVIS - 1-2 VIEW  COMPARISON:  None.  FINDINGS: Diffuse degenerative changes of  the lower lumbar spine with an associated scoliosis as before. Mild SI joint arthropathy. Bones are osteopenic. Pelvis and hips appear symmetric and intact. No displaced fracture.  IMPRESSION: Degenerative changes of the spine.  Osteopenia.  No acute displaced fracture   Electronically Signed   By: Daryll Brod M.D.   On: 10/06/2013 00:01   Ct Head Wo Contrast  10/06/2013   CLINICAL DATA:  Fall, confusion, hypertension, diabetes, dementia, CVA 2013.  EXAM: CT HEAD WITHOUT CONTRAST  TECHNIQUE: Contiguous axial images were obtained from the base of the skull through the vertex without intravenous contrast.  COMPARISON:  05/22/2013 MRI, 05/22/2013 CT  FINDINGS: Similar prominence of the sulci, cisterns, and ventricles, in keeping with volume loss. Periventricular and subcortical white matter hypodensities are similar to prior and in keeping with chronic microangiopathic change. A remote right thalamic lacunar infarction is suspected. No CT evidence of a cortical based (large artery) infarct. No intraparenchymal hemorrhage, mass, mass effect, or abnormal extra-axial fluid collection. No hydrocephalus. Atherosclerotic vascular calcifications. Bilateral maxillary sinus mucosal thickening and mucous retention cysts. Mastoid air cells are clear. No displaced calvarial fracture.  IMPRESSION: Volume loss and white matter changes are similar to prior. Remote right thalamic lacunar infarction. No CT evidence of acute intracranial abnormality.   Electronically Signed   By: Carlos Levering M.D.   On: 10/06/2013 00:01    Scheduled Meds: . amLODipine  10 mg Oral Daily  . atorvastatin  10 mg Oral Daily  . ciprofloxacin  500 mg Oral BID  . clopidogrel  75 mg Oral Daily  . feeding supplement (GLUCERNA SHAKE)  237 mL Oral BID BM  . ferrous sulfate  325 mg Oral TID PC  . insulin aspart  0-9 Units Subcutaneous TID WC  . insulin glargine  18 Units Subcutaneous QHS  . ipratropium-albuterol  3 mL Nebulization TID  .  loratadine  10 mg Oral Daily  . Memantine HCl ER  28 mg Oral Daily   Continuous Infusions:    Principal Problem:   Diarrhea Active Problems:   Type 2 diabetes, uncontrolled, with neuropathy   Dementia   Elevated lactic acid level   Dehydration   ARF (acute renal failure)   UTI (lower urinary tract infection)    Time spent: 30 min    Korvin Valentine, [place] [facility] Pager [phone]. If 7PM-7AM, please contact night-coverage at [email or web address], password [facility] 10/07/2013, 2:44 PM  LOS: 2 days

## 2013-10-07 NOTE — Evaluation (Signed)
Physical Therapy Evaluation Patient Details Name: [name] MRN: [id number] DOB: April 29, 1931 Today's Date: 10/07/2013 Time: 3086-5784 PT Time Calculation (min): 31 min  PT Assessment / Plan / Recommendation History of Present Illness  pt was admitted for nausea/vomiting and diarrhea, and respiratory distress  Clinical Impression  Pt with slightly unsteady gait (unsure if this is near baseline or not, family not present). She states they are with her at all times and she only uses cane outside of the home.  If so, then this current state is not at her baseline. May try a RW to see if it gives her more stability next time. After this session she was very fatigued and could not try again with RW. She was SOB with ambulation and from 94% O2 sats at rest on RA to 85% -89% after exertion . It did recover to 94% after about 2 minutes of breathing and rest at EOB. To benefit from PT to continue to progress mobility and assess O2 sats as well.     PT Assessment  Patient needs continued PT services    Follow Up Recommendations  Home health PT    Does the patient have the potential to tolerate intense rehabilitation      Barriers to Discharge        Equipment Recommendations   (may need RW if she doesn't have one, will have to ask family)    Recommendations for Other Services     Frequency Min 3X/week    Precautions / Restrictions Restrictions Weight Bearing Restrictions: No   Pertinent Vitals/Pain Denies any pain      Mobility  Bed Mobility Overal bed mobility: Independent General bed mobility comments: from flat bed Transfers Overall transfer level: Needs assistance Equipment used: None Transfers: Sit to/from Stand Sit to Stand: Min assist General transfer comment: to steady and assist to stand Ambulation/Gait Ambulation/Gait assistance: Min assist Ambulation Distance (Feet): 70 Feet Assistive device: 1 person hand held assist Gait Pattern/deviations: WFL(Within  Functional Limits) Gait velocity: slow General Gait Details: slightly unsteady, did not feel like I could let go of her hand and also had steady arm around her waist as well. Pt got very weak and needed 2 standing rest breaks. SOB and tightening of chest with increased distance. " I am just weak"     Exercises     PT Diagnosis: Difficulty walking  PT Problem List: Decreased strength;Decreased activity tolerance;Decreased mobility;Decreased safety awareness PT Treatment Interventions: Gait training;Functional mobility training;Therapeutic activities;Therapeutic exercise;Patient/family education     PT Goals(Current goals can be found in the care plan section) Acute Rehab PT Goals Patient Stated Goal: none stated; agreeable to PT PT Goal Formulation: With patient Time For Goal Achievement: 10/14/13 Potential to Achieve Goals: Good  Visit Information  Last PT Received On: 10/07/13 Assistance Needed: +1 History of Present Illness: pt was admitted for nausea/vomiting and diarrhea, and respiratory distress       Prior [place] expects to be discharged to:: Private residence Living Arrangements: Children Available Help at Discharge: Family Type of Home: Mobile home Home Access: Stairs to enter Entrance Stairs-Number of Steps: 3 Entrance Stairs-Rails: [place]: One level [place];Shower seat;Cane - single point Additional Comments: made need RW , will continue to asess.  Prior Function Level of Independence: Independent Comments: uses cane outside of home. nothing inside home, per patient Communication Communication: No difficulties    Cognition  Cognition Arousal/Alertness: Awake/alert Behavior During Therapy: [facility]  for tasks assessed/performed Overall Cognitive Status: No family/caregiver present to determine baseline cognitive functioning    Extremity/Trunk Assessment Lower Extremity Assessment Lower  Extremity Assessment: Generalized weakness   Balance    End of Session PT - End of Session Equipment Utilized During Treatment: Gait belt Activity Tolerance: Patient tolerated treatment well Patient left: in bed;with bed alarm set;with nursing/sitter in room Nurse Communication: Mobility status  GP     Clide Dales 10/07/2013, 11:28 AM Clide Dales, PT Pager: [phone] 10/07/2013

## 2013-10-07 NOTE — Progress Notes (Signed)
Pt unable to void.  Bladder scan revealed 565 ml in bladder.  Md notified and foley cath reordered. Andre Lefort

## 2013-10-08 DIAGNOSIS — J189 Pneumonia, unspecified organism: Secondary | ICD-10-CM

## 2013-10-08 LAB — CBC
HCT: 26.4 % — ABNORMAL LOW (ref 36.0–46.0)
Hemoglobin: 8.4 g/dL — ABNORMAL LOW (ref 12.0–15.0)
MCH: 28.2 pg (ref 26.0–34.0)
MCHC: 31.8 g/dL (ref 30.0–36.0)
MCV: 88.6 fL (ref 78.0–100.0)
PLATELETS: 327 10*3/uL (ref 150–400)
RBC: 2.98 MIL/uL — ABNORMAL LOW (ref 3.87–5.11)
RDW: 14 % (ref 11.5–15.5)
WBC: 6.6 10*3/uL (ref 4.0–10.5)

## 2013-10-08 LAB — BASIC METABOLIC PANEL
BUN: 12 mg/dL (ref 6–23)
CALCIUM: 8.8 mg/dL (ref 8.4–10.5)
CO2: 24 mEq/L (ref 19–32)
Chloride: 101 mEq/L (ref 96–112)
Creatinine, Ser: 1.11 mg/dL — ABNORMAL HIGH (ref 0.50–1.10)
GFR calc non Af Amer: 46 mL/min — ABNORMAL LOW (ref >90)
GFR, EST AFRICAN AMERICAN: 53 mL/min — AB (ref >90)
GLUCOSE: 109 mg/dL — AB (ref 70–99)
Potassium: 3.7 mEq/L (ref 3.7–5.3)
Sodium: 138 mEq/L (ref 137–147)

## 2013-10-08 LAB — GLUCOSE, CAPILLARY
GLUCOSE-CAPILLARY: 138 mg/dL — AB (ref 70–99)
GLUCOSE-CAPILLARY: 218 mg/dL — AB (ref 70–99)
Glucose-Capillary: 109 mg/dL — ABNORMAL HIGH (ref 70–99)
Glucose-Capillary: 180 mg/dL — ABNORMAL HIGH (ref 70–99)
Glucose-Capillary: 142 mg/dL — ABNORMAL HIGH (ref 70–99)

## 2013-10-08 MED ORDER — DOCUSATE SODIUM 100 MG PO CAPS
100.0000 mg | ORAL_CAPSULE | Freq: Two times a day (BID) | ORAL | Status: DC
  Administered 2013-10-08 – 2013-10-10 (×5): 100 mg via ORAL
  Filled 2013-10-08 (×6): qty 1

## 2013-10-08 MED ORDER — ALBUTEROL SULFATE (2.5 MG/3ML) 0.083% IN NEBU
2.5000 mg | INHALATION_SOLUTION | Freq: Four times a day (QID) | RESPIRATORY_TRACT | Status: DC | PRN
  Administered 2013-10-08: 2.5 mg via RESPIRATORY_TRACT

## 2013-10-08 MED ORDER — ALUM & MAG HYDROXIDE-SIMETH 200-200-20 MG/5ML PO SUSP
15.0000 mL | ORAL | Status: DC | PRN
  Administered 2013-10-08: 15 mL via ORAL
  Filled 2013-10-08: qty 30

## 2013-10-08 MED ORDER — PANTOPRAZOLE SODIUM 40 MG PO TBEC
40.0000 mg | DELAYED_RELEASE_TABLET | Freq: Every day | ORAL | Status: DC
  Administered 2013-10-08 – 2013-10-10 (×3): 40 mg via ORAL
  Filled 2013-10-08 (×3): qty 1

## 2013-10-08 MED ORDER — SENNA 8.6 MG PO TABS: 2.0000 | ORAL_TABLET | Freq: Every evening | ORAL | Status: DC | PRN

## 2013-10-08 MED ORDER — IPRATROPIUM-ALBUTEROL 0.5-2.5 (3) MG/3ML IN SOLN
3.0000 mL | Freq: Two times a day (BID) | RESPIRATORY_TRACT | Status: DC
  Administered 2013-10-08 – 2013-10-10 (×5): 3 mL via RESPIRATORY_TRACT
  Filled 2013-10-08 (×6): qty 3

## 2013-10-08 NOTE — Progress Notes (Addendum)
[facility] PROGRESS NOTE  ATONYA TEMPLER XFG:[id number] DOB: Jan 12, 1931 DOA: 10/05/2013 PCP: Cathlean Cower, MD  Assessment/Plan  Nausea vomiting and diarrhea with severe diarrhea, resolved, but not eating well  -  C. Diff negative -  GI pathogen panel and stool culture have not been drawn yet because no further stools since admission  Acute renal failure and Dehydration from diarrhea, resolved with hydration -  Hold ARB -  Renally dose medications -  Minimize nephrotoxins  Elevated lactic acid level - probably from dehydration and resolved with IVF   Concern for UTI:  UCx negative but obtained post administration of abx -  continue levofloxacin  Acute hypoxic respiratory failure with wheezing due to CAP and known vocal cord dysfunction.  No history of asthma or COPD although does have distant smoking history.   -  CXR:  Multifocal pneumonia -  Levofloxacin day 2 for atypical CAP -  Blood culture x 2 -  Urine legionella and s. pneumo neg -  Continue duonebs -  If fever, check flu  -  ECHO demonstrated normal EF with no evidence of diastolic heart failure.   -  DME home oxygen  Weakness with falls - CT head and x-ray of the pelvis are unremarkable. -  PT/OT:  Home health services  Diabetes mellitus type 2, CBG well controlled, but given recent hypoglycemia, will try to aim slightly higher CBGs -  Decrease lantus to 18 units -  Continue low dose SSI  Hypertension, bp stable - continue amlodipine.  -  Continue prn hydralazine  Acute on chronic anemia due to iron deficiency and chronic disease -  Initial Stool for occult blood is negative. -  Iron studies suggestive of iron deficiency and chronic disease, b12 442, folate pending -  Start iron supplementation -  Defer TSH due to acute illness  History of TIA - continue Plavix.  Leukocytosis probably from diarrhea and resolving  ST depression in the lateral leads of the EKG, stable.  Patient chest pain free.  Initial  troponin mildly elevated, likely some demand ischemia plus renal insufificiency -  troponins now consistently negative -  ECHO without focal wall motion abnl -  Tele:  NSR, okay to d/c telemetry -  Continue plavix  Delirium with underlying dementia due to acute illness.  Continue memantine  Acute urinary retention -  Continue foley today  Moderate protein calorie malnutrition, chronic problem according to family -  Continue supplements -  Encourage eating  Diet:  Healthy heart Access:  PIV IVF:  yes Proph:  SCDs  Code Status: DNR Family Communication: patient and daughter Disposition Plan: pending improved breathing   Consultants:  None  Procedures:  CT head  Pelvis XR  Antibiotics:  cipro 1/29 >>1/31  Flagyl 1/29 >> 1/30  Levofloxacin 1/31 >>  HPI/Subjective:  + SOB and cough and newly hypoxic to low 80s on RA.  No further diarrhea.    Objective: Filed Vitals:   10/07/13 1551 10/07/13 2112 10/08/13 0502 10/08/13 0700  BP: 128/37 144/68 132/55   Pulse: 95 90 85   Temp: 98 F (36.7 C) 99 F (37.2 C) 97.9 F (36.6 C)   TempSrc: Oral Oral Oral   Resp:  18 18   Height:      Weight:      SpO2: 96% 92% 94% 81%    Intake/Output Summary (Last 24 hours) at 10/08/13 1150 Last data filed at 10/08/13 1005  Gross per 24 hour  Intake  480 ml  Output   1900 ml  Net  -1420 ml   Filed Weights   10/05/13 2233  Weight: 63.685 kg (140 lb 6.4 oz)    Exam:   General:  CF, No acute distress  HEENT:  NCAT, MMM  Cardiovascular:  RRR, nl S1, S2 no mrg, 2+ pulses, warm extremities  Respiratory:  Left base with some rales, still has some upper airway wheeze, no rhonchi, no increased WOB  Abdomen:   NABS, soft, NT/ND  MSK:   Normal tone and bulk, no LEE  Neuro:  Grossly intact  Psych:  Mildly confused but pleasant  Data Reviewed: Basic Metabolic Panel:  Recent Labs Lab 10/04/13 1108 10/05/13 1940 10/06/13 0310 10/07/13 0347 10/08/13 0545   NA 137 137 137 138 138  K 4.0 4.2 3.7 3.6* 3.7  CL 102 98 101 103 101  CO2 27 22 24 25 24   GLUCOSE 42* 334* 158* 57* 109*  BUN 22 23 18 11 12   CREATININE 1.2 1.37* 1.18* 1.11* 1.11*  CALCIUM 9.6 8.8 8.3* 8.5 8.8   Liver Function Tests:  Recent Labs Lab 10/04/13 1108 10/05/13 1940 10/06/13 0310  AST 23 24 23   ALT 19 17 14   ALKPHOS 57 59 54  BILITOT 0.4 0.3 0.4  PROT 7.9 7.6 6.5  ALBUMIN 3.8 3.4* 2.8*   No results found for this basename: LIPASE, AMYLASE,  in the last 168 hours No results found for this basename: AMMONIA,  in the last 168 hours CBC:  Recent Labs Lab 10/04/13 1108 10/05/13 1940 10/06/13 0310 10/07/13 0347 10/08/13 0545  WBC 12.0* 17.7* 13.9* 10.7* 6.6  NEUTROABS 8.9* 15.0* 11.1*  --   --   HGB 9.4* 8.5* 7.5* 7.9* 8.4*  HCT 28.9* 26.2* 23.3* 24.9* 26.4*  MCV 89.0 89.1 88.9 89.6 88.6  PLT 334.0 293 276 288 327   Cardiac Enzymes:  Recent Labs Lab 10/06/13 0350 10/06/13 0823 10/06/13 1322 10/06/13 1935  TROPONINI 0.41* <0.30 0.31* <0.30   BNP (last 3 results)  Recent Labs  05/22/13 1640  PROBNP 173.3   CBG:  Recent Labs Lab 10/07/13 0745 10/07/13 1226 10/07/13 1642 10/07/13 2129 10/08/13 0748  GLUCAP 73 157* 99 138* 109*    Recent Results (from the past 240 hour(s))  CLOSTRIDIUM DIFFICILE BY PCR     Status: None   Collection Time    10/05/13  7:48 PM      Result Value Range Status   C difficile by pcr NEGATIVE  NEGATIVE Final   Comment: Performed at [facility]     Status: None   Collection Time    10/05/13  9:11 PM      Result Value Range Status   Specimen Description URINE, CATHETERIZED   Final   Special Requests NONE   Final   Culture  Setup Time     Final   Value: 10/06/2013 10:56     Performed at [place]     Final   Value: NO GROWTH     Performed at [facility]   Culture     Final   Value: NO GROWTH     Performed at [facility]   Report  Status 10/07/2013 FINAL   Final     Studies: Dg Chest Port 1 View  10/07/2013   CLINICAL DATA:  Hypoxia, fever  EXAM: PORTABLE CHEST - 1 VIEW  COMPARISON:  05/22/2013  FINDINGS: Ill-defined airspace opacity  in the right upper lobe. Ill-defined focal airspace opacities in bilateral lower lobes. Mild diffuse interstitial prominence. Heart size upper limits normal. Old fracture deformity of the proximal left humerus. . No effusion.  IMPRESSION: New asymmetric right upper lobe and bilateral lower lobe airspace opacities suggesting multifocal pneumonia, less likely atypical edema.   Electronically Signed   By: Arne Cleveland M.D.   On: 10/07/2013 14:59    Scheduled Meds: . amLODipine  10 mg Oral Daily  . atorvastatin  10 mg Oral Daily  . clopidogrel  75 mg Oral Daily  . feeding supplement (GLUCERNA SHAKE)  237 mL Oral BID BM  . ferrous sulfate  325 mg Oral TID PC  . insulin aspart  0-9 Units Subcutaneous TID WC  . insulin glargine  10 Units Subcutaneous QHS  . ipratropium-albuterol  3 mL Nebulization BID  . levofloxacin  750 mg Oral QODAY  . loratadine  10 mg Oral Daily  . Memantine HCl ER  28 mg Oral Daily   Continuous Infusions:    Principal Problem:   Diarrhea Active Problems:   Type 2 diabetes, uncontrolled, with neuropathy   Dementia   Elevated lactic acid level   Dehydration   ARF (acute renal failure)   UTI (lower urinary tract infection)   Paradoxical vocal cord motion disorder   CAP (community acquired pneumonia)    Time spent: 30 min    Roxy Mastandrea, [facility] Pager [phone]. If 7PM-7AM, please contact night-coverage at [email or web address], password [facility] 10/08/2013, 11:50 AM  LOS: 3 days

## 2013-10-09 LAB — LEGIONELLA ANTIGEN, URINE: LEGIONELLA ANTIGEN, URINE: NEGATIVE

## 2013-10-09 LAB — CBC
HCT: 23.6 % — ABNORMAL LOW (ref 36.0–46.0)
Hemoglobin: 7.6 g/dL — ABNORMAL LOW (ref 12.0–15.0)
MCH: 28.4 pg (ref 26.0–34.0)
MCHC: 32.2 g/dL (ref 30.0–36.0)
MCV: 88.1 fL (ref 78.0–100.0)
PLATELETS: 305 10*3/uL (ref 150–400)
RBC: 2.68 MIL/uL — ABNORMAL LOW (ref 3.87–5.11)
RDW: 13.8 % (ref 11.5–15.5)
WBC: 6.3 10*3/uL (ref 4.0–10.5)

## 2013-10-09 LAB — GLUCOSE, CAPILLARY
GLUCOSE-CAPILLARY: 190 mg/dL — AB (ref 70–99)
Glucose-Capillary: 123 mg/dL — ABNORMAL HIGH (ref 70–99)
Glucose-Capillary: 310 mg/dL — ABNORMAL HIGH (ref 70–99)
Glucose-Capillary: 215 mg/dL — ABNORMAL HIGH (ref 70–99)

## 2013-10-09 LAB — BASIC METABOLIC PANEL
BUN: 17 mg/dL (ref 6–23)
CALCIUM: 8.5 mg/dL (ref 8.4–10.5)
CO2: 25 meq/L (ref 19–32)
CREATININE: 1.22 mg/dL — AB (ref 0.50–1.10)
Chloride: 100 mEq/L (ref 96–112)
GFR calc Af Amer: 47 mL/min — ABNORMAL LOW (ref >90)
GFR, EST NON AFRICAN AMERICAN: 41 mL/min — AB (ref >90)
GLUCOSE: 148 mg/dL — AB (ref 70–99)
Potassium: 4.3 mEq/L (ref 3.7–5.3)
Sodium: 136 mEq/L — ABNORMAL LOW (ref 137–147)

## 2013-10-09 MED ORDER — MEGESTROL ACETATE 400 MG/10ML PO SUSP
400.0000 mg | Freq: Every day | ORAL | Status: DC
  Administered 2013-10-09 – 2013-10-10 (×2): 400 mg via ORAL
  Filled 2013-10-09 (×2): qty 10

## 2013-10-09 MED ORDER — GLUCERNA SHAKE PO LIQD: 237.0000 mL | Freq: Two times a day (BID) | ORAL | Status: DC

## 2013-10-09 MED ORDER — INSULIN GLARGINE 100 UNIT/ML ~~LOC~~ SOLN: 10.0000 [IU] | Freq: Every day | SUBCUTANEOUS | Status: DC

## 2013-10-09 MED ORDER — FERROUS SULFATE 325 (65 FE) MG PO TABS: 325.0000 mg | ORAL_TABLET | Freq: Three times a day (TID) | ORAL | Status: DC

## 2013-10-09 MED ORDER — DSS 100 MG PO CAPS: 100.0000 mg | ORAL_CAPSULE | Freq: Two times a day (BID) | ORAL | Status: DC

## 2013-10-09 MED ORDER — MEGESTROL ACETATE 400 MG/10ML PO SUSP: 400.0000 mg | Freq: Every day | ORAL | Status: DC

## 2013-10-09 MED ORDER — LEVOFLOXACIN 750 MG PO TABS: 750.0000 mg | ORAL_TABLET | ORAL | Status: DC

## 2013-10-09 NOTE — Progress Notes (Signed)
Voide 100cc- bladder scan= 253cc, Dr Sheran Fava texted.

## 2013-10-09 NOTE — Discharge Summary (Addendum)
Physician Discharge Summary  IXCHEL DUCK XTG:[id number] DOB: 1931-02-21 DOA: 10/05/2013  PCP: Cathlean Cower, MD  Admit date: 10/05/2013 Discharge date: 10/09/2013  Recommendations for Outpatient Follow-up:  1. Follow up with primary care doctor in 1 week for reevaluation.  Repeat BMP and CBC to f/u kidney function and anemia.  F/u pending SPEP/UPEP/IFE 2. Urology in 1 week for evaluation of urinary retention   Discharge Diagnoses:  Principal Problem:   Diarrhea Active Problems:   Type 2 diabetes, uncontrolled, with neuropathy   Dementia   Elevated lactic acid level   Dehydration   ARF (acute renal failure)   UTI (lower urinary tract infection)   Paradoxical vocal cord motion disorder   CAP (community acquired pneumonia)   Discharge Condition: stable, improved  Diet recommendation:  diabetic with supplements  Wt Readings from Last 3 Encounters:  10/05/13 63.685 kg (140 lb 6.4 oz)  10/04/13 65.318 kg (144 lb)  07/27/13 65.998 kg (145 lb 8 oz)    History of present illness:  [name] is a 80 y.o. female with history of diabetes mellitus type 2, hypertension, hyperlipidemia, history of TIA, dementia and chronic anemia was brought to the ER patient had multiple episodes of diarrhea since last night. Patient also was feeling weak and has had 3 falls in her house. Patient during the falls as per the patient's daughter did not lose consciousness. She also had one large loose bowel movement in the ER which was nonbloody. Stool for blood was negative. Lactic acid was found to be elevated. Patient has been feeling extremely weak and tired. Otherwise patient denies any chest pain or shortness of breath. Per patient's family patient has been having poor appetite over the last 2 days. Patient had gone for routine office visit to her primary care 2 days ago and since patient has had recurrent UTI UA was done which are showing features concerning for UTI. Macrobid was called in and was  only taken today around afternoon the first dose. Patient's diarrhea as per the family had started even before Macrobid was given. On my exam patient's abdomen appears benign. As per the family patient also was looking confused at times hallucinating. Presently patient is alert awake oriented to her name and follows commands.  Hospital Course:   Diarrhea, resolved.  Likely due to enterocolitis given acute onset, limited duration, association with pneumonia.  She had no abdominal pain to suggest ischemic colitis.   - C. Diff negative  - GI pathogen panel and stool culture were not obtained because she did not have any more diarrhea after admission.    Mild acute renal failure due to dehydration from diarrhea, resolved with hydration.  Creatinine trended down from 1.37 to 1.11.  Her ARB was held and her medications were renally dose.  She is at risk of recurrent dehydration due to poor appetite.  See below.    Elevated lactic acid level - probably from dehydration and resolved with IVF:  decreased from 3.1 to 0.8.    Concern for UTI: UCx negative but obtained post administration of abx.  She should continue abx to complete a 7-10 day course.    Acute hypoxic respiratory failure with wheezing due to CAP and known vocal cord dysfunction. No history of asthma or COPD although does have distant smoking history.  Her CXR demonstrated multifocal pneumonia.  She had initially been started on ciprofloxacin for presumptive UTI, however, when her CXR revealed PNA, this was transitioned to levofloxacin for better gram positive  coverage.  Her dyspnea and hypoxia improved and on the day of discharge, she did not require oxygen at rest or with ambulation to keep O2 saturations >89%.  She completed 3- days of levofloxacin in the hospital and should continue this abx for 4 more days to complete a 7-day course.   - Blood culture NGTD - Urine legionella and s. pneumo neg  - ECHO demonstrated normal EF with no evidence  of diastolic heart failure.   Weakness with falls likely due to acute illness - CT head and x-ray of the pelvis are unremarkable.  Will set up home health services for home safety evaluation, to work on increasing strength, endurance, and maximize independence.    Diabetes mellitus type 2, with some hypoglycemia initially, likely secondary to acute illness and poor oral intake.  Her lantus dose was decreased and she should continue reduced lantus at home until her follow up appointment with her PCP in 1 week.  She should call her doctor if she has CBG < 70 or > 300 prior to her follow up appointment.    Hypertension, SBP elevated but diastolic BPs low.  Continue amlodipine.  Acute on chronic anemia due to iron deficiency and chronic disease - Initial Stool for occult blood was negative.  - Iron studies suggestive of iron deficiency and chronic disease, b12 442, folate wnl. - Start iron supplementation  - Deferred TSH due to acute illness, this can be checked by PCP -  Consider referral to GI for endoscopy if family interested in pursuing more aggressive care   History of TIA - continue Plavix.   Leukocytosis probably from diarrhea and resolved   ST depression in the lateral leads of the EKG, stable. Patient chest pain free. Initial troponin mildly elevated at 0.41, likely some demand ischemia plus renal insufificiency.  Troponins trended down and remained negative.  Telemetry demonstrated NSR.  ECHO without focal wall motion abnl.  She continued plavix and may follow up with cardiology within one month of discharge.    Delirium with underlying dementia due to acute illness. Continue memantine   Acute urinary retention, unclear etiology.  She had a foley catheter placed which was removed several times during admission, however, she continued to have difficulty voiding with > 250 mL residuals.  She had her foley catheter replaced and was advised to follow up with urology in 1 week.      Moderate protein calorie malnutrition, chronic problem according to family.  Recommended a liberalized diet with supplements.  Started megace.    Consultants:  None Procedures:  CT head  Pelvis XR Antibiotics:  cipro 1/29 >>1/31  Flagyl 1/29 >> 1/30  Levofloxacin 1/31 >>  Discharge Exam: Filed Vitals:   10/09/13 1348  BP: 130/60  Pulse: 91  Temp: 98.3 F (36.8 C)  Resp: 18   Filed Vitals:   10/09/13 0526 10/09/13 0811 10/09/13 0944 10/09/13 1348  BP: 144/49   130/60  Pulse: 88   91  Temp: 99 F (37.2 C)   98.3 F (36.8 C)  TempSrc: Oral   Oral  Resp: 18   18  Height:      Weight:      SpO2: 94% 91% 94% 93%    General: CF, No acute distress  HEENT: NCAT, MMM  Cardiovascular: RRR, nl S1, S2 no mrg, 2+ pulses, warm extremities  Respiratory:  CTAB, no increased WOB  Abdomen: NABS, soft, NT/ND  MSK: Normal tone and bulk, no LEE  Neuro: Grossly intact  Psych:  A&O x 4    Discharge Instructions      Discharge Orders   Future Appointments Provider Department Dept Phone   12/14/2013 10:00 AM Dennie Bible, NP [facility] [phone]   04/04/2014 10:00 AM Biagio Borg, MD [place] [phone]   Future Orders Complete By Expires   Call MD for:  difficulty breathing, headache or visual disturbances  As directed    Call MD for:  extreme fatigue  As directed    Call MD for:  hives  As directed    Call MD for:  persistant dizziness or light-headedness  As directed    Call MD for:  persistant nausea and vomiting  As directed    Call MD for:  severe uncontrolled pain  As directed    Call MD for:  temperature >100.4  As directed    Diet Carb Modified  As directed    Discharge instructions  As directed    Comments:     Ms. Fede was hospitalized with severe diarrhea and most likely had a viral infection.  Although she did not have bacterial C. Diff infection, we were unable to collect a stool sample to test for  other bacterial causes of diarrhea.  Fortunately, her diarrhea stopped quickly.  She has been treated for possible urinary tract infection and for pneumonia.  She will need to take levofloxacin to complete a 7-day course of antibiotic for pneumonia.  Her next dose is due Wednesday and her final dose is due on Friday.  Because her blood sugars have been low, please decrease her lantus to 10 units until she follows up with her primary care doctor.  Also, her losartan blood pressure medication can worsen kidney function if you have recently been dehydrated so please stop this medication until she follows up with her doctor in 1-2 weeks.  She was started on megace to stimulate her appetite.  It is okay to be more liberal with what you let her eat and drink.  We can always increase her insulin if needed to accommodate rising blood sugars, but she is at higher risk of dehydration and malnutrition than out-of-control diabetes at this time.  She will need repeat bloodwork and an exam by her primary doctor in 1-2 weeks.  He will need to follow up on final report of tests for multiple myeloma.   Increase activity slowly  As directed        Medication List    STOP taking these medications       KLOR-CON 8 MEQ tablet  Generic drug:  potassium chloride     losartan 50 MG tablet  Commonly known as:  COZAAR     nitrofurantoin (macrocrystal-monohydrate) 100 MG capsule  Commonly known as:  MACROBID      TAKE these medications       amLODipine 10 MG tablet  Commonly known as:  NORVASC  Take 1 tablet (10 mg total) by mouth daily.     atorvastatin 10 MG tablet  Commonly known as:  LIPITOR  Take 1 tablet (10 mg total) by mouth daily.     cetirizine 10 MG tablet  Commonly known as:  ZYRTEC  Take 10 mg by mouth daily.     clonazePAM 0.25 MG disintegrating tablet  Commonly known as:  KLONOPIN  Take 0.25 mg by mouth 2 (two) times daily as needed (anxiety).     clopidogrel 75 MG tablet  Commonly known  as:  PLAVIX  Take 1 tablet (75 mg total) by mouth daily.     DSS 100 MG Caps  Take 100 mg by mouth 2 (two) times daily.     esomeprazole 20 MG capsule  Commonly known as:  NEXIUM 24HR  Take 1 capsule (20 mg total) by mouth daily before breakfast.     feeding supplement (GLUCERNA SHAKE) Liqd  Take 237 mLs by mouth 2 (two) times daily between meals.     ferrous sulfate 325 (65 FE) MG tablet  Take 1 tablet (325 mg total) by mouth 3 (three) times daily after meals.     insulin glargine 100 UNIT/ML injection  Commonly known as:  LANTUS  Inject 0.1 mLs (10 Units total) into the skin daily.     levofloxacin 750 MG tablet  Commonly known as:  LEVAQUIN  Take 1 tablet (750 mg total) by mouth every other day. Next dose on Wednesday, 10/11/2013     megestrol 400 MG/10ML suspension  Commonly known as:  MEGACE  Take 10 mLs (400 mg total) by mouth daily.     MELATONIN PO  Take 1 tablet by mouth at bedtime as needed (sleep).     Memantine HCl ER 28 MG Cp24  Commonly known as:  NAMENDA XR  Take 28 mg by mouth daily.     metFORMIN 500 MG tablet  Commonly known as:  GLUCOPHAGE  Take 1 tablet (500 mg total) by mouth 2 (two) times daily with a meal.       Follow-up Information   Follow up with Cathlean Cower, MD. Schedule an appointment as soon as possible for a visit in 1 week.   Specialties:  Internal Medicine, Radiology   Contact information:   [place] [address] [phone]       The results of significant diagnostics from this hospitalization (including imaging, microbiology, ancillary and laboratory) are listed below for reference.    Significant Diagnostic Studies: Dg Pelvis 1-2 Views  10/06/2013   CLINICAL DATA:  Fall, hip pain  EXAM: PELVIS - 1-2 VIEW  COMPARISON:  None.  FINDINGS: Diffuse degenerative changes of the lower lumbar spine with an associated scoliosis as before. Mild SI joint arthropathy. Bones are osteopenic. Pelvis and hips appear  symmetric and intact. No displaced fracture.  IMPRESSION: Degenerative changes of the spine.  Osteopenia.  No acute displaced fracture   Electronically Signed   By: Daryll Brod M.D.   On: 10/06/2013 00:01   Ct Head Wo Contrast  10/06/2013   CLINICAL DATA:  Fall, confusion, hypertension, diabetes, dementia, CVA 2013.  EXAM: CT HEAD WITHOUT CONTRAST  TECHNIQUE: Contiguous axial images were obtained from the base of the skull through the vertex without intravenous contrast.  COMPARISON:  05/22/2013 MRI, 05/22/2013 CT  FINDINGS: Similar prominence of the sulci, cisterns, and ventricles, in keeping with volume loss. Periventricular and subcortical white matter hypodensities are similar to prior and in keeping with chronic microangiopathic change. A remote right thalamic lacunar infarction is suspected. No CT evidence of a cortical based (large artery) infarct. No intraparenchymal hemorrhage, mass, mass effect, or abnormal extra-axial fluid collection. No hydrocephalus. Atherosclerotic vascular calcifications. Bilateral maxillary sinus mucosal thickening and mucous retention cysts. Mastoid air cells are clear. No displaced calvarial fracture.  IMPRESSION: Volume loss and white matter changes are similar to prior. Remote right thalamic lacunar infarction. No CT evidence of acute intracranial abnormality.   Electronically Signed   By: Carlos Levering M.D.   On:  10/06/2013 00:01   Dg Chest Port 1 View  10/07/2013   CLINICAL DATA:  Hypoxia, fever  EXAM: PORTABLE CHEST - 1 VIEW  COMPARISON:  05/22/2013  FINDINGS: Ill-defined airspace opacity in the right upper lobe. Ill-defined focal airspace opacities in bilateral lower lobes. Mild diffuse interstitial prominence. Heart size upper limits normal. Old fracture deformity of the proximal left humerus. . No effusion.  IMPRESSION: New asymmetric right upper lobe and bilateral lower lobe airspace opacities suggesting multifocal pneumonia, less likely atypical edema.    Electronically Signed   By: Arne Cleveland M.D.   On: 10/07/2013 14:59    Microbiology: Recent Results (from the past 240 hour(s))  CLOSTRIDIUM DIFFICILE BY PCR     Status: None   Collection Time    10/05/13  7:48 PM      Result Value Range Status   C difficile by pcr NEGATIVE  NEGATIVE Final   Comment: Performed at [facility]     Status: None   Collection Time    10/05/13  9:11 PM      Result Value Range Status   Specimen Description URINE, CATHETERIZED   Final   Special Requests NONE   Final   Culture  Setup Time     Final   Value: 10/06/2013 10:56     Performed at [facility]     Final   Value: NO GROWTH     Performed at [facility]   Culture     Final   Value: NO GROWTH     Performed at [facility]   Report Status 10/07/2013 FINAL   Final  CULTURE, BLOOD (ROUTINE X 2)     Status: None   Collection Time    10/07/13  5:03 PM      Result Value Range Status   Specimen Description BLOOD LEFT ARM   Final   Special Requests BOTTLES DRAWN AEROBIC AND ANAEROBIC 10CC   Final   Culture  Setup Time     Final   Value: 10/07/2013 20:20     Performed at [facility]   Culture     Final   Value:        BLOOD CULTURE RECEIVED NO GROWTH TO DATE CULTURE WILL BE HELD FOR 5 DAYS BEFORE ISSUING A FINAL NEGATIVE REPORT     Performed at [facility]   Report Status PENDING   Incomplete  CULTURE, BLOOD (ROUTINE X 2)     Status: None   Collection Time    10/07/13  5:16 PM      Result Value Range Status   Specimen Description BLOOD RIGHT ARM   Final   Special Requests BOTTLES DRAWN AEROBIC AND ANAEROBIC 10CC   Final   Culture  Setup Time     Final   Value: 10/07/2013 20:20     Performed at [facility]   Culture     Final   Value:        BLOOD CULTURE RECEIVED NO GROWTH TO DATE CULTURE WILL BE HELD FOR 5 DAYS BEFORE ISSUING A FINAL NEGATIVE REPORT     Performed at [facility]   Report  Status PENDING   Incomplete     Labs: Basic Metabolic Panel:  Recent Labs Lab 10/05/13 1940 10/06/13 0310 10/07/13 0347 10/08/13 0545 10/09/13 0441  NA 137 137 138 138 136*  K 4.2 3.7 3.6* 3.7 4.3  CL 98 101  103 101 100  CO2 22 24 25 24 25   GLUCOSE 334* 158* 57* 109* 148*  BUN 23 18 11 12 17   CREATININE 1.37* 1.18* 1.11* 1.11* 1.22*  CALCIUM 8.8 8.3* 8.5 8.8 8.5   Liver Function Tests:  Recent Labs Lab 10/04/13 1108 10/05/13 1940 10/06/13 0310  AST 23 24 23   ALT 19 17 14   ALKPHOS 57 59 54  BILITOT 0.4 0.3 0.4  PROT 7.9 7.6 6.5  ALBUMIN 3.8 3.4* 2.8*   No results found for this basename: LIPASE, AMYLASE,  in the last 168 hours No results found for this basename: AMMONIA,  in the last 168 hours CBC:  Recent Labs Lab 10/04/13 1108 10/05/13 1940 10/06/13 0310 10/07/13 0347 10/08/13 0545 10/09/13 0441  WBC 12.0* 17.7* 13.9* 10.7* 6.6 6.3  NEUTROABS 8.9* 15.0* 11.1*  --   --   --   HGB 9.4* 8.5* 7.5* 7.9* 8.4* 7.6*  HCT 28.9* 26.2* 23.3* 24.9* 26.4* 23.6*  MCV 89.0 89.1 88.9 89.6 88.6 88.1  PLT 334.0 293 276 288 327 305   Cardiac Enzymes:  Recent Labs Lab 10/06/13 0350 10/06/13 0823 10/06/13 1322 10/06/13 1935  TROPONINI 0.41* <0.30 0.31* <0.30   BNP: BNP (last 3 results)  Recent Labs  05/22/13 1640  PROBNP 173.3   CBG:  Recent Labs Lab 10/08/13 1149 10/08/13 1719 10/08/13 2209 10/09/13 0716 10/09/13 1133  GLUCAP 180* 218* 142* 123* 310*    Time coordinating discharge: 45 minutes  Signed:  Katee Wentland  [facility] 10/09/2013, 2:53 PM

## 2013-10-09 NOTE — Progress Notes (Signed)
Physical Therapy Treatment Patient Details Name: [name] MRN: [id number] DOB: 10-18-1930 Today's Date: 10/09/2013 Time: 9892-1194 PT Time Calculation (min): 24 min  PT Assessment / Plan / Recommendation  History of Present Illness pt was admitted for nausea/vomiting and diarrhea, and respiratory distress   PT Comments   *Pt progressing with mobility, increased gait distance, now not requiring O2 with ambulation. SaO2 94% on RA with walking.**  Follow Up Recommendations  Home health PT     Does the patient have the potential to tolerate intense rehabilitation     Barriers to Discharge        Equipment Recommendations  None recommended by PT (Pt stated she has RW at home)    Recommendations for Other Services    Frequency Min 3X/week   Progress towards PT Goals Progress towards PT goals: Progressing toward goals  Plan Current plan remains appropriate    Precautions / Restrictions Precautions Precautions: Fall Restrictions Weight Bearing Restrictions: No   Pertinent Vitals/Pain *SATURATION QUALIFICATIONS: (This note is used to comply with regulatory documentation for home oxygen)  Patient Saturations on Room Air at Rest = *90*%  Patient Saturations on Room Air while Ambulating = 94**%    Please briefly explain why patient needs home oxygen:* Not indicated  Pt denied pain.    Mobility  Bed Mobility Overal bed mobility: Independent General bed mobility comments: from flat bed Transfers Overall transfer level: Needs assistance Equipment used: None Transfers: Sit to/from Stand Sit to Stand: Supervision General transfer comment: VCs for hand placement Ambulation/Gait Ambulation Distance (Feet): 160 Feet Assistive device: Rolling walker (2 wheeled) Gait Pattern/deviations: WFL(Within Functional Limits) Gait velocity: decr General Gait Details: SaO2 94% on RA walking, steady, no LOB    Exercises General Exercises - Upper Extremity Shoulder Flexion:  AROM;Both;10 reps;Seated General Exercises - Lower Extremity Ankle Circles/Pumps: AROM;Both;10 reps;Supine Heel Slides: AROM;Both;10 reps;Supine Hip ABduction/ADduction: AROM;Both;10 reps;Supine   PT Diagnosis:    PT Problem List:   PT Treatment Interventions:     PT Goals (current goals can now be found in the care plan section) Acute Rehab PT Goals Patient Stated Goal: none stated; agreeable to PT PT Goal Formulation: With patient Time For Goal Achievement: 10/14/13 Potential to Achieve Goals: Good  Visit Information  Last PT Received On: 10/09/13 Assistance Needed: +1 History of Present Illness: pt was admitted for nausea/vomiting and diarrhea, and respiratory distress    Subjective Data  Patient Stated Goal: none stated; agreeable to PT   Cognition  Cognition Arousal/Alertness: Awake/alert Behavior During Therapy: WFL for tasks assessed/performed Overall Cognitive Status: No family/caregiver present to determine baseline cognitive functioning    Balance     End of Session PT - End of Session Equipment Utilized During Treatment: Gait belt Activity Tolerance: Patient tolerated treatment well Patient left: with nursing/sitter in room;in chair;with call bell/phone within reach Nurse Communication: Mobility status   GP     Blondell Reveal Kistler 10/09/2013, 9:48 AM [phone]

## 2013-10-10 LAB — PROTEIN ELECTROPHORESIS, SERUM
ALPHA-2-GLOBULIN: 15.1 % — AB (ref 7.1–11.8)
Albumin ELP: 47.3 % — ABNORMAL LOW (ref 55.8–66.1)
Alpha-1-Globulin: 7.8 % — ABNORMAL HIGH (ref 2.9–4.9)
Beta 2: 5 % (ref 3.2–6.5)
Beta Globulin: 6.3 % (ref 4.7–7.2)
Gamma Globulin: 18.5 % (ref 11.1–18.8)
M-SPIKE, %: NOT DETECTED g/dL
TOTAL PROTEIN ELP: 6 g/dL (ref 6.0–8.3)

## 2013-10-10 LAB — CBC
HCT: 22.4 % — ABNORMAL LOW (ref 36.0–46.0)
Hemoglobin: 7.2 g/dL — ABNORMAL LOW (ref 12.0–15.0)
MCH: 28.1 pg (ref 26.0–34.0)
MCHC: 32.1 g/dL (ref 30.0–36.0)
MCV: 87.5 fL (ref 78.0–100.0)
PLATELETS: 291 10*3/uL (ref 150–400)
RBC: 2.56 MIL/uL — ABNORMAL LOW (ref 3.87–5.11)
RDW: 13.7 % (ref 11.5–15.5)
WBC: 9.5 10*3/uL (ref 4.0–10.5)

## 2013-10-10 LAB — BASIC METABOLIC PANEL
BUN: 20 mg/dL (ref 6–23)
CALCIUM: 8.7 mg/dL (ref 8.4–10.5)
CO2: 26 mEq/L (ref 19–32)
CREATININE: 1.22 mg/dL — AB (ref 0.50–1.10)
Chloride: 103 mEq/L (ref 96–112)
GFR calc Af Amer: 47 mL/min — ABNORMAL LOW (ref >90)
GFR, EST NON AFRICAN AMERICAN: 41 mL/min — AB (ref >90)
Glucose, Bld: 170 mg/dL — ABNORMAL HIGH (ref 70–99)
Potassium: 3.8 mEq/L (ref 3.7–5.3)
Sodium: 141 mEq/L (ref 137–147)

## 2013-10-10 LAB — UIFE/LIGHT CHAINS/TP QN, 24-HR UR
Albumin, U: DETECTED
Alpha 1, Urine: DETECTED — AB
Alpha 2, Urine: DETECTED — AB
Beta, Urine: DETECTED — AB
FREE KAPPA LT CHAINS, UR: 15.7 mg/dL — AB (ref 0.14–2.42)
FREE LAMBDA LT CHAINS, UR: 1.18 mg/dL — AB (ref 0.02–0.67)
Free Kappa/Lambda Ratio: 13.31 ratio — ABNORMAL HIGH (ref 2.04–10.37)
GAMMA UR: DETECTED — AB
Total Protein, Urine: 33.9 mg/dL

## 2013-10-10 LAB — GLUCOSE, CAPILLARY: GLUCOSE-CAPILLARY: 140 mg/dL — AB (ref 70–99)

## 2013-10-10 MED ORDER — SODIUM CHLORIDE 0.9 % IV BOLUS (SEPSIS): 500.0000 mL | Freq: Once | INTRAVENOUS | Status: DC

## 2013-10-10 NOTE — Progress Notes (Signed)
Met with patient prior to discharge to offer [facility] services. She pleasantly declined. Left [facility] Care Management brochure at bedside to call in future if she changes her mind. Made inpatient RNCM aware. She suggested Probation officer call patient's daughter in law, McQueeney as well. Spoke with Jenny Reichmann at [phone] to discuss [facility] services. States they will call in future if needed. Made her aware where the brochure was left in room. Made inpatient RNCM aware. Marthenia Rolling, MSN, RN,BSN- [facility] [phone]

## 2013-10-10 NOTE — Progress Notes (Signed)
Occupational Therapy Treatment Patient Details Name: [name] MRN: [id number] DOB: [birth year] Today's Date: 10/10/2013 Time: 9323-5573 OT Time Calculation (min): 26 min  OT Assessment / Plan / Recommendation  History of present illness pt was admitted for nausea/vomiting and diarrhea, and respiratory distress   OT comments  Pt doing well with ADL and has assist at d/c. Pt may d/c home today.  Follow Up Recommendations  Supervision/Assistance - 24 hour    Barriers to Discharge       Equipment Recommendations  None recommended by OT    Recommendations for Other Services    Frequency Min 2X/week   Progress towards OT Goals Progress towards OT goals: Progressing toward goals  Plan Discharge plan remains appropriate    Precautions / Restrictions Precautions Precautions: Fall Restrictions Weight Bearing Restrictions: No   Pertinent Vitals/Pain 98% on RA after activity    ADL  Toilet Transfer: Performed;Min guard Toilet Transfer Equipment: Comfort height toilet;Grab bars Toileting - Clothing Manipulation and Hygiene: Simulated;Min guard Where Assessed - Toileting Clothing Manipulation and Hygiene: Sit to stand from 3-in-1 or toilet Equipment Used: Rolling walker ADL Comments: Min verbal cues for hand placement with sit to stand. Pt is staying at son's house and has help PRN. She has a standard height commode there but has a vanity next to it to help with transitions on and off toilet. Sats 98% after activity.     OT Diagnosis:    OT Problem List:   OT Treatment Interventions:     OT Goals(current goals can now be found in the care plan section)    Visit Information  Last OT Received On: 10/10/13 Assistance Needed: +1 History of Present Illness: pt was admitted for nausea/vomiting and diarrhea, and respiratory distress    Subjective Data      Prior Functioning       Cognition  Cognition Arousal/Alertness: Awake/alert Behavior During Therapy: WFL for tasks  assessed/performed Overall Cognitive Status: History of cognitive impairments - at baseline    Mobility  Bed Mobility Overal bed mobility: Needs Assistance Bed Mobility: Supine to Sit Supine to sit: Supervision Transfers Overall transfer level: Needs assistance Equipment used: Rolling walker (2 wheeled) Transfers: Sit to/from Stand Sit to Stand: Supervision General transfer comment: VCs for hand placement    Exercises      Balance General Comments General comments (skin integrity, edema, etc.): supervision standing for clothing management.  End of Session OT - End of Session Equipment Utilized During Treatment: Rolling walker Activity Tolerance: Patient tolerated treatment well Patient left: in chair;with call bell/phone within reach  [address], [place] [phone] 10/10/2013, 9:32 AM

## 2013-10-10 NOTE — Discharge Summary (Addendum)
Physician Discharge Summary  [name] BUL:[id number] DOB: April 20, 1931 DOA: 10/05/2013  PCP: Cathlean Cower, MD  Admit date: 10/05/2013 Discharge date: 10/10/2013  Recommendations for Outpatient Follow-up:  1. Follow up with primary care doctor in 1 week for reevaluation.  Repeat BMP and CBC to f/u kidney function and anemia.  F/u pending SPEP/UPEP/IFE. 2. Urology in 1 week for evaluation of urinary retention   Discharge Diagnoses:  Principal Problem:   Diarrhea Active Problems:   Type 2 diabetes, uncontrolled, with neuropathy   Dementia   Elevated lactic acid level   Dehydration   ARF (acute renal failure)   UTI (lower urinary tract infection)   Paradoxical vocal cord motion disorder   CAP (community acquired pneumonia)   Discharge Condition: stable, improved  Diet recommendation:  diabetic with supplements  Wt Readings from Last 3 Encounters:  10/05/13 63.685 kg (140 lb 6.4 oz)  10/04/13 65.318 kg (144 lb)  07/27/13 65.998 kg (145 lb 8 oz)    History of present illness:  [name] is a 80 y.o. female with history of diabetes mellitus type 2, hypertension, hyperlipidemia, history of TIA, dementia and chronic anemia was brought to the ER patient had multiple episodes of diarrhea since last night. Patient also was feeling weak and has had 3 falls in her house. Patient during the falls as per the patient's daughter did not lose consciousness. She also had one large loose bowel movement in the ER which was nonbloody. Stool for blood was negative. Lactic acid was found to be elevated. Patient has been feeling extremely weak and tired. Otherwise patient denies any chest pain or shortness of breath. Per patient's family patient has been having poor appetite over the last 2 days. Patient had gone for routine office visit to her primary care 2 days ago and since patient has had recurrent UTI UA was done which are showing features concerning for UTI. Macrobid was called in and was  only taken today around afternoon the first dose. Patient's diarrhea as per the family had started even before Macrobid was given. On my exam patient's abdomen appears benign. As per the family patient also was looking confused at times hallucinating. Presently patient is alert awake oriented to her name and follows commands.  Hospital Course:   Diarrhea, resolved.  Likely due to enterocolitis given acute onset, limited duration, association with pneumonia.  She had no abdominal pain to suggest ischemic colitis.   - C. Diff negative  - GI pathogen panel and stool culture were not obtained because she did not have any more diarrhea after admission.    Mild acute renal failure due to dehydration from diarrhea, resolved with hydration.  Creatinine trended down from 1.37 to 1.11.  Her ARB was held and her medications were renally dose.  She is at risk of recurrent dehydration due to poor appetite.  See below.    Elevated lactic acid level - probably from dehydration and resolved with IVF:  decreased from 3.1 to 0.8.    Concern for UTI: UCx negative but obtained post administration of abx.  She should continue abx to complete a 7-10 day course.    Acute hypoxic respiratory failure with wheezing due to CAP and known vocal cord dysfunction. No history of asthma or COPD although does have distant smoking history.  Her CXR demonstrated multifocal pneumonia.  She had initially been started on ciprofloxacin for presumptive UTI, however, when her CXR revealed PNA, this was transitioned to levofloxacin for better gram positive  coverage.  Her dyspnea and hypoxia improved and on the day of discharge, she did not require oxygen at rest or with ambulation to keep O2 saturations >89%.  She completed 3- days of levofloxacin in the hospital and should continue this abx for 4 more days to complete a 7-day course.   - Blood culture NGTD - Urine legionella and s. pneumo neg  - ECHO demonstrated normal EF with no evidence  of diastolic heart failure.   Weakness with falls likely due to acute illness - CT head and x-ray of the pelvis are unremarkable.  Will set up home health services for home safety evaluation, to work on increasing strength, endurance, and maximize independence.    Diabetes mellitus type 2, with some hypoglycemia initially, likely secondary to acute illness and poor oral intake.  Her lantus dose was decreased and she should continue reduced lantus at home until her follow up appointment with her PCP in 1 week.  She should call her doctor if she has CBG < 70 or > 300 prior to her follow up appointment.    Hypertension, SBP elevated but diastolic BPs low.  Continue amlodipine.  Acute on chronic anemia due to iron deficiency and chronic disease - Stool for occult blood was negative.  - Iron studies suggestive of iron deficiency and chronic disease, b12 442, folate wnl. - Start iron supplementation  - Deferred TSH due to acute illness, this can be checked by PCP -  Consider referral to GI for endoscopy if family interested in pursuing more aggressive care -  PCP to follow up on pending SPEP/UPEP/IFE and referral to hematology if needed   History of TIA - continue Plavix.   Leukocytosis probably from diarrhea and resolved   ST depression in the lateral leads of the EKG, stable. Patient chest pain free. Initial troponin mildly elevated at 0.41, likely some demand ischemia plus renal insufificiency.  Troponins trended down and remained negative.  Telemetry demonstrated NSR.  ECHO without focal wall motion abnl.  She continued plavix and may follow up with cardiology within one month of discharge.    Delirium with underlying dementia due to acute illness. Continue memantine   Acute urinary retention, unclear etiology.  She had a foley catheter placed which was removed several times during admission, however, she continued to have difficulty voiding with > 250 mL residuals.  She had her foley catheter  replaced and was advised to follow up with urology in 1 week.     Moderate protein calorie malnutrition, chronic problem according to family.  Recommended a liberalized diet with supplements.  Started megace.    Consultants:  None Procedures:  CT head  Pelvis XR Antibiotics:  cipro 1/29 >>1/31  Flagyl 1/29 >> 1/30  Levofloxacin 1/31 >>  Discharge Exam: Filed Vitals:   10/10/13 0624  BP: 148/51  Pulse: 89  Temp: 99 F (37.2 C)  Resp: 18   Filed Vitals:   10/09/13 2240 10/10/13 0624 10/10/13 0831 10/10/13 0920  BP: 135/47 148/51    Pulse: 88 89    Temp: 98 F (36.7 C) 99 F (37.2 C)    TempSrc: Oral Oral    Resp: 18 18    Height:      Weight:      SpO2: 93% 97% 96% 98%    General: CF, No acute distress, sitting up eating breakfast, smiling HEENT: NCAT, MMM  Cardiovascular: RRR, nl S1, S2 no mrg, 2+ pulses, warm extremities  Respiratory:  CTAB, no increased  WOB  Abdomen: NABS, soft, NT/ND  MSK: Normal tone and bulk, no LEE  Neuro: Grossly intact  Psych:  A&O x 4    Discharge Instructions      Discharge Orders   Future Appointments Provider Department Dept Phone   12/14/2013 10:00 AM Dennie Bible, NP [facility] [phone]   04/04/2014 10:00 AM Biagio Borg, MD [facility] [phone]   Future Orders Complete By Expires   Call MD for:  difficulty breathing, headache or visual disturbances  As directed    Call MD for:  extreme fatigue  As directed    Call MD for:  hives  As directed    Call MD for:  persistant dizziness or light-headedness  As directed    Call MD for:  persistant nausea and vomiting  As directed    Call MD for:  severe uncontrolled pain  As directed    Call MD for:  temperature >100.4  As directed    Diet Carb Modified  As directed    Discharge instructions  As directed    Comments:     Ms. Mitter was hospitalized with severe diarrhea and most likely had a viral infection.  Although  she did not have bacterial C. Diff infection, we were unable to collect a stool sample to test for other bacterial causes of diarrhea.  Fortunately, her diarrhea stopped quickly.  She has been treated for possible urinary tract infection and for pneumonia.  She will need to take levofloxacin to complete a 7-day course of antibiotic for pneumonia.  Her next dose is due Wednesday and her final dose is due on Friday.  Because her blood sugars have been low, please decrease her lantus to 10 units until she follows up with her primary care doctor.  Also, her losartan blood pressure medication can worsen kidney function if you have recently been dehydrated so please stop this medication until she follows up with her doctor in 1-2 weeks.  She was started on megace to stimulate her appetite.  It is okay to be more liberal with what you let her eat and drink.  We can always increase her insulin if needed to accommodate rising blood sugars, but she is at higher risk of dehydration and malnutrition than out-of-control diabetes at this time.  She will need repeat bloodwork and an exam by her primary doctor in 1-2 weeks.  He will need to follow up on final report of tests for multiple myeloma.   Increase activity slowly  As directed        Medication List    STOP taking these medications       KLOR-CON 8 MEQ tablet  Generic drug:  potassium chloride     losartan 50 MG tablet  Commonly known as:  COZAAR     nitrofurantoin (macrocrystal-monohydrate) 100 MG capsule  Commonly known as:  MACROBID      TAKE these medications       amLODipine 10 MG tablet  Commonly known as:  NORVASC  Take 1 tablet (10 mg total) by mouth daily.     atorvastatin 10 MG tablet  Commonly known as:  LIPITOR  Take 1 tablet (10 mg total) by mouth daily.     cetirizine 10 MG tablet  Commonly known as:  ZYRTEC  Take 10 mg by mouth daily.     clonazePAM 0.25 MG disintegrating tablet  Commonly known as:  KLONOPIN  Take 0.25 mg  by mouth 2 (  two) times daily as needed (anxiety).     clopidogrel 75 MG tablet  Commonly known as:  PLAVIX  Take 1 tablet (75 mg total) by mouth daily.     DSS 100 MG Caps  Take 100 mg by mouth 2 (two) times daily.     esomeprazole 20 MG capsule  Commonly known as:  NEXIUM 24HR  Take 1 capsule (20 mg total) by mouth daily before breakfast.     feeding supplement (GLUCERNA SHAKE) Liqd  Take 237 mLs by mouth 2 (two) times daily between meals.     ferrous sulfate 325 (65 FE) MG tablet  Take 1 tablet (325 mg total) by mouth 3 (three) times daily after meals.     insulin glargine 100 UNIT/ML injection  Commonly known as:  LANTUS  Inject 0.1 mLs (10 Units total) into the skin daily.     levofloxacin 750 MG tablet  Commonly known as:  LEVAQUIN  Take 1 tablet (750 mg total) by mouth every other day. Next dose on Wednesday, 10/11/2013     megestrol 400 MG/10ML suspension  Commonly known as:  MEGACE  Take 10 mLs (400 mg total) by mouth daily.     MELATONIN PO  Take 1 tablet by mouth at bedtime as needed (sleep).     Memantine HCl ER 28 MG Cp24  Commonly known as:  NAMENDA XR  Take 28 mg by mouth daily.     metFORMIN 500 MG tablet  Commonly known as:  GLUCOPHAGE  Take 1 tablet (500 mg total) by mouth 2 (two) times daily with a meal.       Follow-up Information   Follow up with Cathlean Cower, MD. Schedule an appointment as soon as possible for a visit in 1 week.   Specialties:  Internal Medicine, Radiology   Contact information:   [place] [address] [phone]       Follow up with [facility]. Schedule an appointment as soon as possible for a visit in 1 week.   Contact information:   [address] [place] [address] [phone]      The results of significant diagnostics from this hospitalization (including imaging, microbiology, ancillary and laboratory) are listed below for reference.    Significant Diagnostic  Studies: Dg Pelvis 1-2 Views  10/06/2013   CLINICAL DATA:  Fall, hip pain  EXAM: PELVIS - 1-2 VIEW  COMPARISON:  None.  FINDINGS: Diffuse degenerative changes of the lower lumbar spine with an associated scoliosis as before. Mild SI joint arthropathy. Bones are osteopenic. Pelvis and hips appear symmetric and intact. No displaced fracture.  IMPRESSION: Degenerative changes of the spine.  Osteopenia.  No acute displaced fracture   Electronically Signed   By: Daryll Brod M.D.   On: 10/06/2013 00:01   Ct Head Wo Contrast  10/06/2013   CLINICAL DATA:  Fall, confusion, hypertension, diabetes, dementia, CVA 2013.  EXAM: CT HEAD WITHOUT CONTRAST  TECHNIQUE: Contiguous axial images were obtained from the base of the skull through the vertex without intravenous contrast.  COMPARISON:  05/22/2013 MRI, 05/22/2013 CT  FINDINGS: Similar prominence of the sulci, cisterns, and ventricles, in keeping with volume loss. Periventricular and subcortical white matter hypodensities are similar to prior and in keeping with chronic microangiopathic change. A remote right thalamic lacunar infarction is suspected. No CT evidence of a cortical based (large artery) infarct. No intraparenchymal hemorrhage, mass, mass effect, or abnormal extra-axial fluid collection. No hydrocephalus.  Atherosclerotic vascular calcifications. Bilateral maxillary sinus mucosal thickening and mucous retention cysts. Mastoid air cells are clear. No displaced calvarial fracture.  IMPRESSION: Volume loss and white matter changes are similar to prior. Remote right thalamic lacunar infarction. No CT evidence of acute intracranial abnormality.   Electronically Signed   By: Carlos Levering M.D.   On: 10/06/2013 00:01   Dg Chest Port 1 View  10/07/2013   CLINICAL DATA:  Hypoxia, fever  EXAM: PORTABLE CHEST - 1 VIEW  COMPARISON:  05/22/2013  FINDINGS: Ill-defined airspace opacity in the right upper lobe. Ill-defined focal airspace opacities in bilateral lower  lobes. Mild diffuse interstitial prominence. Heart size upper limits normal. Old fracture deformity of the proximal left humerus. . No effusion.  IMPRESSION: New asymmetric right upper lobe and bilateral lower lobe airspace opacities suggesting multifocal pneumonia, less likely atypical edema.   Electronically Signed   By: Arne Cleveland M.D.   On: 10/07/2013 14:59    Microbiology: Recent Results (from the past 240 hour(s))  CLOSTRIDIUM DIFFICILE BY PCR     Status: None   Collection Time    10/05/13  7:48 PM      Result Value Range Status   C difficile by pcr NEGATIVE  NEGATIVE Final   Comment: Performed at [facility]     Status: None   Collection Time    10/05/13  9:11 PM      Result Value Range Status   Specimen Description URINE, CATHETERIZED   Final   Special Requests NONE   Final   Culture  Setup Time     Final   Value: 10/06/2013 10:56     Performed at [facility]     Final   Value: NO GROWTH     Performed at [facility]   Culture     Final   Value: NO GROWTH     Performed at [facility]   Report Status 10/07/2013 FINAL   Final  CULTURE, BLOOD (ROUTINE X 2)     Status: None   Collection Time    10/07/13  5:03 PM      Result Value Range Status   Specimen Description BLOOD LEFT ARM   Final   Special Requests BOTTLES DRAWN AEROBIC AND ANAEROBIC 10CC   Final   Culture  Setup Time     Final   Value: 10/07/2013 20:20     Performed at [facility]   Culture     Final   Value:        BLOOD CULTURE RECEIVED NO GROWTH TO DATE CULTURE WILL BE HELD FOR 5 DAYS BEFORE ISSUING A FINAL NEGATIVE REPORT     Performed at [facility]   Report Status PENDING   Incomplete  CULTURE, BLOOD (ROUTINE X 2)     Status: None   Collection Time    10/07/13  5:16 PM      Result Value Range Status   Specimen Description BLOOD RIGHT ARM   Final   Special Requests BOTTLES DRAWN AEROBIC AND ANAEROBIC 10CC   Final    Culture  Setup Time     Final   Value: 10/07/2013 20:20     Performed at [facility]   Culture     Final   Value:        BLOOD CULTURE RECEIVED NO GROWTH TO DATE CULTURE WILL BE HELD FOR 5 DAYS BEFORE ISSUING A FINAL  NEGATIVE REPORT     Performed at [facility]   Report Status PENDING   Incomplete     Labs: Basic Metabolic Panel:  Recent Labs Lab 10/06/13 0310 10/07/13 0347 10/08/13 0545 10/09/13 0441 10/10/13 0537  NA 137 138 138 136* 141  K 3.7 3.6* 3.7 4.3 3.8  CL 101 103 101 100 103  CO2 24 25 24 25 26   GLUCOSE 158* 57* 109* 148* 170*  BUN 18 11 12 17 20   CREATININE 1.18* 1.11* 1.11* 1.22* 1.22*  CALCIUM 8.3* 8.5 8.8 8.5 8.7   Liver Function Tests:  Recent Labs Lab 10/04/13 1108 10/05/13 1940 10/06/13 0310  AST 23 24 23   ALT 19 17 14   ALKPHOS 57 59 54  BILITOT 0.4 0.3 0.4  PROT 7.9 7.6 6.5  ALBUMIN 3.8 3.4* 2.8*   No results found for this basename: LIPASE, AMYLASE,  in the last 168 hours No results found for this basename: AMMONIA,  in the last 168 hours CBC:  Recent Labs Lab 10/04/13 1108 10/05/13 1940 10/06/13 0310 10/07/13 0347 10/08/13 0545 10/09/13 0441 10/10/13 0537  WBC 12.0* 17.7* 13.9* 10.7* 6.6 6.3 9.5  NEUTROABS 8.9* 15.0* 11.1*  --   --   --   --   HGB 9.4* 8.5* 7.5* 7.9* 8.4* 7.6* 7.2*  HCT 28.9* 26.2* 23.3* 24.9* 26.4* 23.6* 22.4*  MCV 89.0 89.1 88.9 89.6 88.6 88.1 87.5  PLT 334.0 293 276 288 327 305 291   Cardiac Enzymes:  Recent Labs Lab 10/06/13 0350 10/06/13 0823 10/06/13 1322 10/06/13 1935  TROPONINI 0.41* <0.30 0.31* <0.30   BNP: BNP (last 3 results)  Recent Labs  05/22/13 1640  PROBNP 173.3   CBG:  Recent Labs Lab 10/09/13 0716 10/09/13 1133 10/09/13 1701 10/09/13 2204 10/10/13 0745  GLUCAP 123* 310* 190* 215* 140*    Time coordinating discharge: 45 minutes  Signed:  Quinta Eimer  [facility] 10/10/2013, 10:35 AM

## 2013-10-11 LAB — GLUCOSE, CAPILLARY: GLUCOSE-CAPILLARY: 229 mg/dL — AB (ref 70–99)

## 2013-10-13 ENCOUNTER — Inpatient Hospital Stay (HOSPITAL_COMMUNITY)
Admission: EM | Admit: 2013-10-13 | Discharge: 2013-10-17 | DRG: 291 | Disposition: A | Discharge status: Skilled Nursing Facility | Payer: Medicare Other | Attending: Internal Medicine | Admitting: Internal Medicine

## 2013-10-13 ENCOUNTER — Encounter (HOSPITAL_COMMUNITY): Payer: Self-pay | Admitting: Emergency Medicine

## 2013-10-13 ENCOUNTER — Emergency Department (HOSPITAL_COMMUNITY): Payer: Medicare Other

## 2013-10-13 DIAGNOSIS — E669 Obesity, unspecified: Secondary | ICD-10-CM

## 2013-10-13 DIAGNOSIS — R197 Diarrhea, unspecified: Secondary | ICD-10-CM

## 2013-10-13 DIAGNOSIS — R7989 Other specified abnormal findings of blood chemistry: Secondary | ICD-10-CM

## 2013-10-13 DIAGNOSIS — R634 Abnormal weight loss: Secondary | ICD-10-CM

## 2013-10-13 DIAGNOSIS — K5900 Constipation, unspecified: Secondary | ICD-10-CM

## 2013-10-13 DIAGNOSIS — R490 Dysphonia: Secondary | ICD-10-CM

## 2013-10-13 DIAGNOSIS — Z860100 Personal history of colon polyps, unspecified: Secondary | ICD-10-CM

## 2013-10-13 DIAGNOSIS — E1140 Type 2 diabetes mellitus with diabetic neuropathy, unspecified: Secondary | ICD-10-CM | POA: Diagnosis present

## 2013-10-13 DIAGNOSIS — H81399 Other peripheral vertigo, unspecified ear: Secondary | ICD-10-CM | POA: Diagnosis present

## 2013-10-13 DIAGNOSIS — R269 Unspecified abnormalities of gait and mobility: Secondary | ICD-10-CM

## 2013-10-13 DIAGNOSIS — Z8673 Personal history of transient ischemic attack (TIA), and cerebral infarction without residual deficits: Secondary | ICD-10-CM

## 2013-10-13 DIAGNOSIS — E1165 Type 2 diabetes mellitus with hyperglycemia: Secondary | ICD-10-CM

## 2013-10-13 DIAGNOSIS — Z79899 Other long term (current) drug therapy: Secondary | ICD-10-CM

## 2013-10-13 DIAGNOSIS — F329 Major depressive disorder, single episode, unspecified: Secondary | ICD-10-CM | POA: Diagnosis present

## 2013-10-13 DIAGNOSIS — K222 Esophageal obstruction: Secondary | ICD-10-CM

## 2013-10-13 DIAGNOSIS — K219 Gastro-esophageal reflux disease without esophagitis: Secondary | ICD-10-CM

## 2013-10-13 DIAGNOSIS — F411 Generalized anxiety disorder: Secondary | ICD-10-CM

## 2013-10-13 DIAGNOSIS — N390 Urinary tract infection, site not specified: Secondary | ICD-10-CM

## 2013-10-13 DIAGNOSIS — Z8249 Family history of ischemic heart disease and other diseases of the circulatory system: Secondary | ICD-10-CM

## 2013-10-13 DIAGNOSIS — F22 Delusional disorders: Secondary | ICD-10-CM

## 2013-10-13 DIAGNOSIS — R42 Dizziness and giddiness: Secondary | ICD-10-CM

## 2013-10-13 DIAGNOSIS — E43 Unspecified severe protein-calorie malnutrition: Secondary | ICD-10-CM

## 2013-10-13 DIAGNOSIS — M549 Dorsalgia, unspecified: Secondary | ICD-10-CM

## 2013-10-13 DIAGNOSIS — F0390 Unspecified dementia without behavioral disturbance: Secondary | ICD-10-CM

## 2013-10-13 DIAGNOSIS — R5383 Other fatigue: Secondary | ICD-10-CM

## 2013-10-13 DIAGNOSIS — J383 Other diseases of vocal cords: Secondary | ICD-10-CM

## 2013-10-13 DIAGNOSIS — I351 Nonrheumatic aortic (valve) insufficiency: Secondary | ICD-10-CM

## 2013-10-13 DIAGNOSIS — G459 Transient cerebral ischemic attack, unspecified: Secondary | ICD-10-CM

## 2013-10-13 DIAGNOSIS — J189 Pneumonia, unspecified organism: Secondary | ICD-10-CM

## 2013-10-13 DIAGNOSIS — Z8701 Personal history of pneumonia (recurrent): Secondary | ICD-10-CM

## 2013-10-13 DIAGNOSIS — J309 Allergic rhinitis, unspecified: Secondary | ICD-10-CM

## 2013-10-13 DIAGNOSIS — E1149 Type 2 diabetes mellitus with other diabetic neurological complication: Secondary | ICD-10-CM | POA: Diagnosis present

## 2013-10-13 DIAGNOSIS — R1319 Other dysphagia: Secondary | ICD-10-CM

## 2013-10-13 DIAGNOSIS — E785 Hyperlipidemia, unspecified: Secondary | ICD-10-CM

## 2013-10-13 DIAGNOSIS — R0609 Other forms of dyspnea: Secondary | ICD-10-CM

## 2013-10-13 DIAGNOSIS — N179 Acute kidney failure, unspecified: Secondary | ICD-10-CM

## 2013-10-13 DIAGNOSIS — M810 Age-related osteoporosis without current pathological fracture: Secondary | ICD-10-CM

## 2013-10-13 DIAGNOSIS — R5381 Other malaise: Secondary | ICD-10-CM

## 2013-10-13 DIAGNOSIS — I509 Heart failure, unspecified: Secondary | ICD-10-CM | POA: Diagnosis present

## 2013-10-13 DIAGNOSIS — Z0000 Encounter for general adult medical examination without abnormal findings: Secondary | ICD-10-CM

## 2013-10-13 DIAGNOSIS — E860 Dehydration: Secondary | ICD-10-CM

## 2013-10-13 DIAGNOSIS — Z833 Family history of diabetes mellitus: Secondary | ICD-10-CM

## 2013-10-13 DIAGNOSIS — Z794 Long term (current) use of insulin: Secondary | ICD-10-CM

## 2013-10-13 DIAGNOSIS — Z885 Allergy status to narcotic agent status: Secondary | ICD-10-CM

## 2013-10-13 DIAGNOSIS — Z7902 Long term (current) use of antithrombotics/antiplatelets: Secondary | ICD-10-CM

## 2013-10-13 DIAGNOSIS — E7800 Pure hypercholesterolemia, unspecified: Secondary | ICD-10-CM | POA: Diagnosis present

## 2013-10-13 DIAGNOSIS — R531 Weakness: Secondary | ICD-10-CM

## 2013-10-13 DIAGNOSIS — IMO0002 Reserved for concepts with insufficient information to code with codable children: Secondary | ICD-10-CM

## 2013-10-13 DIAGNOSIS — Z800 Family history of malignant neoplasm of digestive organs: Secondary | ICD-10-CM

## 2013-10-13 DIAGNOSIS — K5730 Diverticulosis of large intestine without perforation or abscess without bleeding: Secondary | ICD-10-CM

## 2013-10-13 DIAGNOSIS — I10 Essential (primary) hypertension: Secondary | ICD-10-CM

## 2013-10-13 DIAGNOSIS — R350 Frequency of micturition: Secondary | ICD-10-CM

## 2013-10-13 DIAGNOSIS — K449 Diaphragmatic hernia without obstruction or gangrene: Secondary | ICD-10-CM

## 2013-10-13 DIAGNOSIS — R110 Nausea: Secondary | ICD-10-CM

## 2013-10-13 DIAGNOSIS — F3289 Other specified depressive episodes: Secondary | ICD-10-CM

## 2013-10-13 DIAGNOSIS — Z881 Allergy status to other antibiotic agents status: Secondary | ICD-10-CM

## 2013-10-13 DIAGNOSIS — E1142 Type 2 diabetes mellitus with diabetic polyneuropathy: Secondary | ICD-10-CM | POA: Diagnosis present

## 2013-10-13 DIAGNOSIS — R339 Retention of urine, unspecified: Secondary | ICD-10-CM | POA: Diagnosis present

## 2013-10-13 DIAGNOSIS — I5031 Acute diastolic (congestive) heart failure: Principal | ICD-10-CM

## 2013-10-13 DIAGNOSIS — D509 Iron deficiency anemia, unspecified: Secondary | ICD-10-CM

## 2013-10-13 DIAGNOSIS — R0600 Dyspnea, unspecified: Secondary | ICD-10-CM

## 2013-10-13 LAB — CBC WITH DIFFERENTIAL/PLATELET
Basophils Absolute: 0 10*3/uL (ref 0.0–0.1)
Basophils Relative: 0 % (ref 0–1)
EOS PCT: 1 % (ref 0–5)
Eosinophils Absolute: 0.1 10*3/uL (ref 0.0–0.7)
HEMATOCRIT: 23.1 % — AB (ref 36.0–46.0)
HEMOGLOBIN: 7.5 g/dL — AB (ref 12.0–15.0)
LYMPHS ABS: 1.8 10*3/uL (ref 0.7–4.0)
LYMPHS PCT: 18 % (ref 12–46)
MCH: 28.2 pg (ref 26.0–34.0)
MCHC: 32.5 g/dL (ref 30.0–36.0)
MCV: 86.8 fL (ref 78.0–100.0)
MONOS PCT: 7 % (ref 3–12)
Monocytes Absolute: 0.7 10*3/uL (ref 0.1–1.0)
Neutro Abs: 7.3 10*3/uL (ref 1.7–7.7)
Neutrophils Relative %: 73 % (ref 43–77)
PLATELETS: 385 10*3/uL (ref 150–400)
RBC: 2.66 MIL/uL — AB (ref 3.87–5.11)
RDW: 13.8 % (ref 11.5–15.5)
WBC: 10 10*3/uL (ref 4.0–10.5)

## 2013-10-13 LAB — COMPREHENSIVE METABOLIC PANEL
ALT: 18 U/L (ref 0–35)
AST: 18 U/L (ref 0–37)
Albumin: 2.9 g/dL — ABNORMAL LOW (ref 3.5–5.2)
Alkaline Phosphatase: 54 U/L (ref 39–117)
BUN: 21 mg/dL (ref 6–23)
CALCIUM: 9.2 mg/dL (ref 8.4–10.5)
CO2: 22 mEq/L (ref 19–32)
Chloride: 102 mEq/L (ref 96–112)
Creatinine, Ser: 1.29 mg/dL — ABNORMAL HIGH (ref 0.50–1.10)
GFR calc Af Amer: 44 mL/min — ABNORMAL LOW (ref >90)
GFR, EST NON AFRICAN AMERICAN: 38 mL/min — AB (ref >90)
GLUCOSE: 189 mg/dL — AB (ref 70–99)
Potassium: 3.6 mEq/L — ABNORMAL LOW (ref 3.7–5.3)
SODIUM: 139 meq/L (ref 137–147)
Total Bilirubin: <0.2 mg/dL — ABNORMAL LOW (ref 0.3–1.2)
Total Protein: 7.1 g/dL (ref 6.0–8.3)

## 2013-10-13 LAB — CULTURE, BLOOD (ROUTINE X 2)
CULTURE: NO GROWTH
Culture: NO GROWTH

## 2013-10-13 LAB — URINALYSIS, ROUTINE W REFLEX MICROSCOPIC
Bilirubin Urine: NEGATIVE
Glucose, UA: NEGATIVE mg/dL
Hgb urine dipstick: NEGATIVE
KETONES UR: NEGATIVE mg/dL
LEUKOCYTES UA: NEGATIVE
Nitrite: NEGATIVE
PROTEIN: NEGATIVE mg/dL
Specific Gravity, Urine: 1.02 (ref 1.005–1.030)
UROBILINOGEN UA: 0.2 mg/dL (ref 0.0–1.0)
pH: 8.5 — ABNORMAL HIGH (ref 5.0–8.0)

## 2013-10-13 LAB — TROPONIN I
Troponin I: <0.3 ng/mL (ref <0.30)
Troponin I: <0.3 ng/mL (ref <0.30)

## 2013-10-13 LAB — PRO B NATRIURETIC PEPTIDE: Pro B Natriuretic peptide (BNP): 1431 pg/mL — ABNORMAL HIGH (ref 0–450)

## 2013-10-13 LAB — OCCULT BLOOD, POC DEVICE: FECAL OCCULT BLD: POSITIVE — AB

## 2013-10-13 MED ORDER — ACETAMINOPHEN 650 MG RE SUPP: 650.0000 mg | Freq: Four times a day (QID) | RECTAL | Status: DC | PRN

## 2013-10-13 MED ORDER — SODIUM CHLORIDE 0.9 % IJ SOLN
3.0000 mL | Freq: Two times a day (BID) | INTRAMUSCULAR | Status: DC
  Administered 2013-10-15 (×2): 3 mL via INTRAVENOUS

## 2013-10-13 MED ORDER — ONDANSETRON HCL 4 MG/2ML IJ SOLN
4.0000 mg | Freq: Four times a day (QID) | INTRAMUSCULAR | Status: DC | PRN
  Administered 2013-10-13: 4 mg via INTRAVENOUS
  Filled 2013-10-13 (×2): qty 2

## 2013-10-13 MED ORDER — METFORMIN HCL 500 MG PO TABS
500.0000 mg | ORAL_TABLET | Freq: Two times a day (BID) | ORAL | Status: DC
  Filled 2013-10-13 (×3): qty 1

## 2013-10-13 MED ORDER — CLOPIDOGREL BISULFATE 75 MG PO TABS
75.0000 mg | ORAL_TABLET | Freq: Every day | ORAL | Status: DC
  Administered 2013-10-14 – 2013-10-17 (×4): 75 mg via ORAL
  Filled 2013-10-13 (×5): qty 1

## 2013-10-13 MED ORDER — CLONAZEPAM 0.125 MG PO TBDP
0.2500 mg | ORAL_TABLET | Freq: Two times a day (BID) | ORAL | Status: DC | PRN
  Administered 2013-10-14 (×2): 0.25 mg via ORAL
  Filled 2013-10-13 (×2): qty 2

## 2013-10-13 MED ORDER — MEGESTROL ACETATE 400 MG/10ML PO SUSP
400.0000 mg | Freq: Every day | ORAL | Status: DC
  Administered 2013-10-14 – 2013-10-17 (×3): 400 mg via ORAL
  Filled 2013-10-13 (×4): qty 10

## 2013-10-13 MED ORDER — ONDANSETRON HCL 4 MG PO TABS
4.0000 mg | ORAL_TABLET | Freq: Four times a day (QID) | ORAL | Status: DC | PRN
  Administered 2013-10-14 – 2013-10-16 (×2): 4 mg via ORAL
  Filled 2013-10-13 (×2): qty 1

## 2013-10-13 MED ORDER — FUROSEMIDE 10 MG/ML IJ SOLN
20.0000 mg | Freq: Once | INTRAMUSCULAR | Status: AC
  Administered 2013-10-14: 20 mg via INTRAVENOUS
  Filled 2013-10-13: qty 2

## 2013-10-13 MED ORDER — INSULIN GLARGINE 100 UNIT/ML ~~LOC~~ SOLN
10.0000 [IU] | Freq: Two times a day (BID) | SUBCUTANEOUS | Status: DC
  Administered 2013-10-14 – 2013-10-17 (×7): 10 [IU] via SUBCUTANEOUS
  Filled 2013-10-13 (×10): qty 0.1

## 2013-10-13 MED ORDER — FUROSEMIDE 10 MG/ML IJ SOLN
INTRAMUSCULAR | Status: AC
  Administered 2013-10-13: 20 mg via INTRAVENOUS
  Filled 2013-10-13: qty 4

## 2013-10-13 MED ORDER — AMLODIPINE BESYLATE 10 MG PO TABS
10.0000 mg | ORAL_TABLET | Freq: Every morning | ORAL | Status: DC
  Administered 2013-10-14 – 2013-10-17 (×4): 10 mg via ORAL
  Filled 2013-10-13 (×4): qty 1

## 2013-10-13 MED ORDER — IOHEXOL 350 MG/ML SOLN
80.0000 mL | Freq: Once | INTRAVENOUS | Status: AC | PRN
  Administered 2013-10-13: 80 mL via INTRAVENOUS

## 2013-10-13 MED ORDER — DOCUSATE SODIUM 100 MG PO CAPS
100.0000 mg | ORAL_CAPSULE | Freq: Two times a day (BID) | ORAL | Status: DC
  Administered 2013-10-14 – 2013-10-17 (×6): 100 mg via ORAL
  Filled 2013-10-13 (×9): qty 1

## 2013-10-13 MED ORDER — FUROSEMIDE 10 MG/ML IJ SOLN
20.0000 mg | Freq: Once | INTRAMUSCULAR | Status: AC
  Administered 2013-10-13: 20 mg via INTRAVENOUS

## 2013-10-13 MED ORDER — GLUCERNA SHAKE PO LIQD
237.0000 mL | Freq: Two times a day (BID) | ORAL | Status: DC
  Administered 2013-10-14 – 2013-10-17 (×5): 237 mL via ORAL
  Filled 2013-10-13 (×8): qty 237

## 2013-10-13 MED ORDER — FUROSEMIDE 20 MG PO TABS
20.0000 mg | ORAL_TABLET | Freq: Every day | ORAL | Status: DC
  Administered 2013-10-14 – 2013-10-17 (×4): 20 mg via ORAL
  Filled 2013-10-13 (×4): qty 1

## 2013-10-13 MED ORDER — SODIUM CHLORIDE 0.9 % IJ SOLN
3.0000 mL | Freq: Two times a day (BID) | INTRAMUSCULAR | Status: DC
  Administered 2013-10-14 – 2013-10-16 (×4): 3 mL via INTRAVENOUS

## 2013-10-13 MED ORDER — ATORVASTATIN CALCIUM 10 MG PO TABS
10.0000 mg | ORAL_TABLET | Freq: Every day | ORAL | Status: DC
  Administered 2013-10-14 – 2013-10-16 (×3): 10 mg via ORAL
  Filled 2013-10-13 (×4): qty 1

## 2013-10-13 MED ORDER — MEMANTINE HCL ER 28 MG PO CP24
1.0000 | ORAL_CAPSULE | Freq: Every morning | ORAL | Status: DC
  Filled 2013-10-13: qty 28

## 2013-10-13 MED ORDER — SODIUM CHLORIDE 0.9 % IJ SOLN: 3.0000 mL | INTRAMUSCULAR | Status: DC | PRN

## 2013-10-13 MED ORDER — PANTOPRAZOLE SODIUM 40 MG PO TBEC
40.0000 mg | DELAYED_RELEASE_TABLET | Freq: Every day | ORAL | Status: DC
  Administered 2013-10-14 – 2013-10-17 (×4): 40 mg via ORAL
  Filled 2013-10-13 (×4): qty 1

## 2013-10-13 MED ORDER — ACETAMINOPHEN 325 MG PO TABS
650.0000 mg | ORAL_TABLET | Freq: Four times a day (QID) | ORAL | Status: DC | PRN
  Filled 2013-10-13: qty 2

## 2013-10-13 MED ORDER — FERROUS SULFATE 325 (65 FE) MG PO TABS
325.0000 mg | ORAL_TABLET | Freq: Three times a day (TID) | ORAL | Status: DC
  Administered 2013-10-13 – 2013-10-17 (×12): 325 mg via ORAL
  Filled 2013-10-13 (×14): qty 1

## 2013-10-13 MED ORDER — TAMSULOSIN HCL 0.4 MG PO CAPS
0.4000 mg | ORAL_CAPSULE | Freq: Every morning | ORAL | Status: DC
Start: 2013-10-14 — End: 2013-10-17
  Administered 2013-10-14 – 2013-10-17 (×4): 0.4 mg via ORAL
  Filled 2013-10-13 (×4): qty 1

## 2013-10-13 MED ORDER — SODIUM CHLORIDE 0.9 % IV SOLN: 250.0000 mL | INTRAVENOUS | Status: DC | PRN

## 2013-10-13 MED ORDER — ENOXAPARIN SODIUM 30 MG/0.3ML ~~LOC~~ SOLN
30.0000 mg | SUBCUTANEOUS | Status: DC
  Administered 2013-10-13 – 2013-10-16 (×4): 30 mg via SUBCUTANEOUS
  Filled 2013-10-13 (×5): qty 0.3

## 2013-10-13 MED ORDER — ENOXAPARIN SODIUM 40 MG/0.4ML ~~LOC~~ SOLN: 40.0000 mg | SUBCUTANEOUS | Status: DC

## 2013-10-13 NOTE — ED Notes (Signed)
Pt transported to xray 

## 2013-10-13 NOTE — ED Notes (Signed)
Per NT D Mel [facility]. Pt RA O2 sat 87%. Pt placed on 2 lpm [facility] 95% at present time.

## 2013-10-13 NOTE — ED Notes (Signed)
EDP [facility] at bedside. O2 turned off. Pt currently 100% on RA.

## 2013-10-13 NOTE — ED Provider Notes (Signed)
CSN: [id number]     Arrival date & time 10/13/13  1219 History   First MD Initiated Contact with Patient 10/13/13 1220     Chief Complaint  Patient presents with  . Shortness of Breath   (Consider location/radiation/quality/duration/timing/severity/associated sxs/prior Treatment) HPI Comments: Patient is an 80 year old female with history of dementia, hypertension. She was recently admitted for urinary tract infection and pneumonia and was discharged one week ago. Today she got up to the bathroom to take a shower. When she was walking back she suddenly became extremely fatigued, weak, and felt short of breath. She denies any chest pain. She denies any productive cough. She has no prior history of coronary disease or congestive heart failure.  Patient is a 80 y.o. female presenting with shortness of breath. The history is provided by the patient.  Shortness of Breath Severity:  Moderate Onset quality:  Sudden Duration:  1 hour Timing:  Constant Progression:  Unchanged Chronicity:  New Context: not activity   Relieved by:  Nothing Worsened by:  Nothing tried Ineffective treatments:  None tried Associated symptoms: no abdominal pain and no fever     Past Medical History  Diagnosis Date  . COLONIC POLYPS 06/15/2005  . DIABETES MELLITUS, TYPE II 04/22/2007  . HYPERCHOLESTEROLEMIA   . HYPERLIPIDEMIA   . OBESITY   . ANEMIA-IRON DEFICIENCY   . DEPRESSION   . HYPERTENSION   . ALLERGIC RHINITIS   . ESOPHAGEAL STRICTURE   . GERD   . HIATAL HERNIA   . DIVERTICULOSIS, COLON   . BACK PAIN   . OSTEOPOROSIS   . DIZZINESS   . WEIGHT LOSS   . DYSPNEA   . Other dysphagia   . COLONIC POLYPS, HX OF 04/03/2002 & 06/15/2005    TUBULAR ADENOMA  . History of CVA (cerebrovascular accident) 07/29/2012    Noted old, to right thalamus and pons by Head CT Jul 27, 2012  . Dementia 09/17/2012  . Type II or unspecified type diabetes mellitus without mention of complication, uncontrolled 07/16/2012    Past Surgical History  Procedure Laterality Date  . Appendectomy    . Abdominal hysterectomy    . Tonsillectomy and adenoidectomy    . Left ankle surgury    . S/p bladder pubovaginal sling    . S/p cystocele/rectocele repair    . Laparoscopic takedown of incarcerated stomach within the chest/nissen  2009  . Esophageal manometry  12/21/2011    Procedure: ESOPHAGEAL MANOMETRY (EM);  Surgeon: Sable Feil, MD;  Location: [facility];  Service: Endoscopy;  Laterality: N/A;   Family History  Problem Relation Age of Onset  . Heart disease Mother 37  . Diabetes Mother   . Heart attack Mother   . Colon cancer Father   . Diabetes Father    History  Substance Use Topics  . Smoking status: Never Smoker   . Smokeless tobacco: Never Used  . Alcohol Use: No   OB History   Grav Para Term Preterm Abortions TAB SAB Ect Mult Living                 Review of Systems  Constitutional: Positive for fatigue. Negative for fever.  Respiratory: Positive for shortness of breath.   Gastrointestinal: Negative for abdominal pain.  All other systems reviewed and are negative.    Allergies  Keflex; Codeine; Hydrocodone-acetaminophen; and Oxycodone-acetaminophen  Home Medications   Current Outpatient Rx  Name  Route  Sig  Dispense  Refill  . amLODipine (NORVASC) 10 MG  tablet   Oral   Take 1 tablet (10 mg total) by mouth daily.   90 tablet   3   . atorvastatin (LIPITOR) 10 MG tablet   Oral   Take 1 tablet (10 mg total) by mouth daily.   90 tablet   3   . cetirizine (ZYRTEC) 10 MG tablet   Oral   Take 10 mg by mouth daily.         . clonazePAM (KLONOPIN) 0.25 MG disintegrating tablet   Oral   Take 0.25 mg by mouth 2 (two) times daily as needed (anxiety).         . clopidogrel (PLAVIX) 75 MG tablet   Oral   Take 1 tablet (75 mg total) by mouth daily.   30 tablet   2   . docusate sodium 100 MG CAPS   Oral   Take 100 mg by mouth 2 (two) times daily.   10 capsule    0   . esomeprazole (NEXIUM 24HR) 20 MG capsule   Oral   Take 1 capsule (20 mg total) by mouth daily before breakfast.   30 capsule   2   . feeding supplement, GLUCERNA SHAKE, (GLUCERNA SHAKE) LIQD   Oral   Take 237 mLs by mouth 2 (two) times daily between meals.   60 Can   0   . ferrous sulfate 325 (65 FE) MG tablet   Oral   Take 1 tablet (325 mg total) by mouth 3 (three) times daily after meals.   90 tablet   0   . insulin glargine (LANTUS) 100 UNIT/ML injection   Subcutaneous   Inject 0.1 mLs (10 Units total) into the skin daily.   10 mL   11   . levofloxacin (LEVAQUIN) 750 MG tablet   Oral   Take 1 tablet (750 mg total) by mouth every other day. Next dose on Wednesday, 10/11/2013   2 tablet   0   . megestrol (MEGACE) 400 MG/10ML suspension   Oral   Take 10 mLs (400 mg total) by mouth daily.   480 mL   0   . MELATONIN PO   Oral   Take 1 tablet by mouth at bedtime as needed (sleep).         . Memantine HCl ER (NAMENDA XR) 28 MG CP24   Oral   Take 28 mg by mouth daily.   30 capsule   6   . metFORMIN (GLUCOPHAGE) 500 MG tablet   Oral   Take 1 tablet (500 mg total) by mouth 2 (two) times daily with a meal.   180 tablet   3    BP 141/88  Pulse 80  Temp(Src) 98.9 F (37.2 C)  Resp 16  SpO2 87% Physical Exam  Nursing note and vitals reviewed. Constitutional: She is oriented to person, place, and time. She appears well-developed and well-nourished. No distress.  Patient is an elderly female in no acute distress. She is awake and alert, however appears somewhat anxious.  HENT:  Head: Normocephalic and atraumatic.  Mouth/Throat: Oropharynx is clear and moist.  Neck: Normal range of motion. Neck supple.  Cardiovascular: Normal rate and regular rhythm.  Exam reveals no gallop and no friction rub.   No murmur heard. Pulmonary/Chest: Effort normal and breath sounds normal. No respiratory distress. She has no wheezes.  Abdominal: Soft. Bowel sounds are  normal. She exhibits no distension. There is no tenderness.  Musculoskeletal: Normal range of motion.  Neurological: She is  alert and oriented to person, place, and time. She exhibits normal muscle tone. Coordination normal.  Skin: Skin is warm and dry. She is not diaphoretic.    ED Course  Procedures (including critical care time) Labs Review Labs Reviewed - No data to display Imaging Review No results found.  EKG Interpretation    Date/Time:  Friday October 13 2013 12:27:41 EST Ventricular Rate:  94 PR Interval:  192 QRS Duration: 93 QT Interval:  379 QTC Calculation: 474 R Axis:   -26 Text Interpretation:  Sinus rhythm Borderline left axis deviation Anterior infarct, old Minimal ST depression, lateral leads Confirmed by DELOS  MD, Shunda Rabadi ([id number]) on 10/13/2013 2:13:56 PM            MDM  No diagnosis found. Patient with shortness of breath on exertion worsening over the past 2 months. She was recently admitted for pneumonia and diarrhea. Workup today reveals an elevated BNP but normal laboratory studies otherwise. CT scan of the chest was performed to rule out PE. This was negative, however it showed a persistent infiltrate in the right upper lobe. I've discussed the care with Dr.Viyouh from medicine who agrees to admit. Lasix given.    Veryl Speak, MD 10/13/13 [id number]

## 2013-10-13 NOTE — ED Notes (Signed)
2 IV attempts unsuccessful by this RN. Mini lab staff at bedside attempting lab draw for pt.

## 2013-10-13 NOTE — H&P (Signed)
[facility] History and Physical  [name] [id number] DOB: 05-20-1931 DOA: 10/13/2013  Referring physician: EDP PCP: Julia Cower, MD   Chief Complaint: Shortness of breath and generalized weakness  HPI: [name] is a 80 y.o. female history of diabetes mellitus, hypertension, dementia, iron deficiency anemia, essentially discharged from hospital on 2/3 following hospitalization and treatment of diarrhea and, UTI pneumonia who presents with above complaints. She states she developed shortness of breath with minimal activity today and has been getting weaker. She denies chest pain, leg swelling, cough fevers and no dysuria. She was seen in the ED and CT angiogram of the chest negative for PE, infiltrate noted in her right upper lobe-same location as her recent pneumonia. Was found to have a hemoglobin of 7.5 which is improved from 7.2 on discharge. Her BNP was elevated at 1431 and urinalysis was negative for infection. Her recent echo of 10/06/13 showed and EF of 55-60%, and grade 1 diastolic dysfunction was noted on the echo of 05/2013. She was given IV Lasix in ED and is admitted for further evaluation and management. She has no prior history of CHF, it is noted that she was hydrated with IV fluids during her recent admission with diarrhea.    Review of Systems The patient denies anorexia, fever, weight loss,, vision loss, decreased hearing, hoarseness, chest pain, syncope, peripheral edema, , hemoptysis, abdominal pain, melena, hematochezia, severe indigestion/heartburn, hematuria, incontinence, abnormal bleeding.   Past Medical History  Diagnosis Date  . COLONIC POLYPS 06/15/2005  . DIABETES MELLITUS, TYPE II 04/22/2007  . HYPERCHOLESTEROLEMIA   . HYPERLIPIDEMIA   . OBESITY   . ANEMIA-IRON DEFICIENCY   . DEPRESSION   . HYPERTENSION   . ALLERGIC RHINITIS   . ESOPHAGEAL STRICTURE   . GERD   . HIATAL HERNIA   . DIVERTICULOSIS, COLON   . BACK PAIN   . OSTEOPOROSIS    . DIZZINESS   . WEIGHT LOSS   . DYSPNEA   . Other dysphagia   . COLONIC POLYPS, HX OF 04/03/2002 & 06/15/2005    TUBULAR ADENOMA  . History of CVA (cerebrovascular accident) 07/29/2012    Noted old, to right thalamus and pons by Head CT Jul 27, 2012  . Dementia 09/17/2012  . Type II or unspecified type diabetes mellitus without mention of complication, uncontrolled 07/16/2012   Past Surgical History  Procedure Laterality Date  . Appendectomy    . Abdominal hysterectomy    . Tonsillectomy and adenoidectomy    . Left ankle surgury    . S/p bladder pubovaginal sling    . S/p cystocele/rectocele repair    . Laparoscopic takedown of incarcerated stomach within the chest/nissen  2009  . Esophageal manometry  12/21/2011    Procedure: ESOPHAGEAL MANOMETRY (EM);  Surgeon: Sable Feil, MD;  Location: [facility];  Service: Endoscopy;  Laterality: N/A;   Social History:  reports that she has never smoked. She has never used smokeless tobacco. She reports that she does not drink alcohol or use illicit drugs.  Allergies  Allergen Reactions  . Keflex [Cephalexin] Other (See Comments)    Pt and her son do not know what the reaction is to this medication  . Codeine Nausea And Vomiting and Rash  . Hydrocodone-Acetaminophen Nausea And Vomiting and Rash  . Oxycodone-Acetaminophen Nausea And Vomiting and Rash    Family History  Problem Relation Age of Onset  . Heart disease Mother 60  . Diabetes Mother   . Heart attack Mother   .  Colon cancer Father   . Diabetes Father      Prior to Admission medications   Medication Sig Start Date End Date Taking? Authorizing Provider  amLODipine (NORVASC) 10 MG tablet Take 10 mg by mouth every morning.   Yes Historical Provider, MD  atorvastatin (LIPITOR) 10 MG tablet Take 10 mg by mouth every morning.   Yes Historical Provider, MD  cetirizine (ZYRTEC) 10 MG tablet Take 10 mg by mouth every morning.    Yes Historical Provider, MD  clonazePAM  (KLONOPIN) 0.25 MG disintegrating tablet Take 0.25 mg by mouth 2 (two) times daily as needed (anxiety). 10/04/13  Yes Biagio Borg, MD  clopidogrel (PLAVIX) 75 MG tablet Take 75 mg by mouth daily with breakfast.   Yes Historical Provider, MD  docusate sodium 100 MG CAPS Take 100 mg by mouth 2 (two) times daily. 10/09/13  Yes Janece Canterbury, MD  esomeprazole (NEXIUM 24HR) 20 MG capsule Take 1 capsule (20 mg total) by mouth daily before breakfast. 06/02/13  Yes Evie Lacks Plotnikov, MD  feeding supplement, GLUCERNA SHAKE, (GLUCERNA SHAKE) LIQD Take 237 mLs by mouth 2 (two) times daily between meals. 10/09/13  Yes Janece Canterbury, MD  ferrous sulfate 325 (65 FE) MG tablet Take 1 tablet (325 mg total) by mouth 3 (three) times daily after meals. 10/09/13  Yes Janece Canterbury, MD  insulin glargine (LANTUS) 100 UNIT/ML injection Inject 10 Units into the skin 2 (two) times daily.   Yes Historical Provider, MD  megestrol (MEGACE) 400 MG/10ML suspension Take 10 mLs (400 mg total) by mouth daily. 10/09/13  Yes Janece Canterbury, MD  MELATONIN PO Take 1 tablet by mouth at bedtime as needed (sleep).   Yes Historical Provider, MD  Memantine HCl ER (NAMENDA XR) 28 MG CP24 Take 1 capsule by mouth every morning.   Yes Historical Provider, MD  metFORMIN (GLUCOPHAGE) 500 MG tablet Take 1 tablet (500 mg total) by mouth 2 (two) times daily with a meal. 04/06/13  Yes Biagio Borg, MD  tamsulosin (FLOMAX) 0.4 MG CAPS capsule Take 0.4 mg by mouth every morning.   Yes Historical Provider, MD  levofloxacin (LEVAQUIN) 750 MG tablet Take 1 tablet (750 mg total) by mouth every other day. Next dose on Wednesday, 10/11/2013 10/09/13   Janece Canterbury, MD   Physical Exam: Filed Vitals:   10/13/13 1828  BP: 133/62  Pulse: 87  Temp: 98.2 F (36.8 C)  Resp: 18    BP 133/62  Pulse 87  Temp(Src) 98.2 F (36.8 C) (Oral)  Resp 18  Ht 5\' 3"  (1.6 m)  Wt 62.143 kg (137 lb)  BMI 24.27 kg/m2  SpO2 100% Constitutional: Vital signs reviewed.   Patient is a well-developed and well-nourished in no acute distress and cooperative with exam. Alert and oriented x2.  Head: Normocephalic and atraumatic Mouth: no erythema or exudates, MMM Eyes: PERRL, EOMI, conjunctivae normal, No scleral icterus.  Neck: Supple, Trachea midline normal ROM, No JVD, mass, thyromegaly, or carotid bruit present.  Cardiovascular: RRR, S1 normal, S2 normal, no MRG, pulses symmetric and intact bilaterally Pulmonary/Chest: normal respiratory effort, decreased breath sounds at the bases, no wheezes Abdominal: Soft. Non-tender, non-distended, bowel sounds are normal, no masses, organomegaly, or guarding present.  GU: no CVA tenderness  extremities: No cyanosis and no edema  Neurological: A&O x2, Strength is normal and symmetric bilaterally, cranial nerve II-XII are grossly intact, no focal motor deficit, sensory intact to light touch bilaterally.  Skin: Warm, dry and intact. No rash.  Psychiatric: Normal mood and affect. s              Labs on Admission:  Basic Metabolic Panel:  Recent Labs Lab 10/07/13 0347 10/08/13 0545 10/09/13 0441 10/10/13 0537 10/13/13 1305  NA 138 138 136* 141 139  K 3.6* 3.7 4.3 3.8 3.6*  CL 103 101 100 103 102  CO2 25 24 25 26 22   GLUCOSE 57* 109* 148* 170* 189*  BUN 11 12 17 20 21   CREATININE 1.11* 1.11* 1.22* 1.22* 1.29*  CALCIUM 8.5 8.8 8.5 8.7 9.2   Liver Function Tests:  Recent Labs Lab 10/13/13 1305  AST 18  ALT 18  ALKPHOS 54  BILITOT <0.2*  PROT 7.1  ALBUMIN 2.9*   No results found for this basename: LIPASE, AMYLASE,  in the last 168 hours No results found for this basename: AMMONIA,  in the last 168 hours CBC:  Recent Labs Lab 10/07/13 0347 10/08/13 0545 10/09/13 0441 10/10/13 0537 10/13/13 1305  WBC 10.7* 6.6 6.3 9.5 10.0  NEUTROABS  --   --   --   --  7.3  HGB 7.9* 8.4* 7.6* 7.2* 7.5*  HCT 24.9* 26.4* 23.6* 22.4* 23.1*  MCV 89.6 88.6 88.1 87.5 86.8  PLT 288 327 305 291 385   Cardiac  Enzymes:  Recent Labs Lab 10/13/13 1305  TROPONINI <0.30    BNP (last 3 results)  Recent Labs  05/22/13 1640 10/13/13 1305  PROBNP 173.3 1431.0*   CBG:  Recent Labs Lab 10/09/13 1133 10/09/13 1701 10/09/13 2204 10/10/13 0745 10/10/13 1146  GLUCAP 310* 190* 215* 140* 229*    Radiological Exams on Admission: Dg Chest 2 View  10/13/2013   CLINICAL DATA:  Shortness of Breath  EXAM: CHEST  2 VIEW  COMPARISON:  October 07, 2013  FINDINGS: There are a few scattered calcified granulomas. Lungs are otherwise clear. Heart is upper normal in size with normal pulmonary vascularity. No adenopathy. There is atherosclerotic change in the aorta. No bone lesions.  IMPRESSION: Recent edema has cleared. Currently lungs are clear except for a few scattered small granulomas.   Electronically Signed   By: Lowella Grip M.D.   On: 10/13/2013 13:26   Ct Angio Chest Pe W/cm &/or Wo Cm  10/13/2013   CLINICAL DATA:  Shortness of breath, clinical concern of pulmonary arterial embolic disease  EXAM: CT ANGIOGRAPHY CHEST WITH CONTRAST  TECHNIQUE: Multidetector CT imaging of the chest was performed using the standard protocol during bolus administration of intravenous contrast. Multiplanar CT image reconstructions including MIPs were obtained to evaluate the vascular anatomy.  CONTRAST:  5mL OMNIPAQUE IOHEXOL 350 MG/ML SOLN  COMPARISON:  DG CHEST 2 VIEW dated 10/13/2013; CT ANGIO CHEST W/CM &/OR WO/CM dated 10/23/2011  FINDINGS: The thoracic inlet is unremarkable. Multichamber cardiac enlargement is identified as well as coronary artery atherosclerotic calcifications.  The esophagus is not dilated and filled with debris, fluid and air. When compared to the previous study the dilation of the esophagus has increased. Measuring 2.6 cm in AP dimensions on image 28 series 4 correlated with a similar level on prior study would esophagus measured 2 cm.  The esophagus is dilated to the level of the gastroesophageal  junction. There is lobulated soft tissue prominence at the level of the gastroesophageal junction. The patient has a history of prior Nissen and subsequent laparoscopic Nissen takedown. Postsurgical changes are identified within this region. When compared to the previous study. The lobulated soft tissue prominence appears stable.  There  is no evidence of filling defects within the main, lobar, or segmental pulmonary arteries. Atherosclerotic calcification identified within the thoracic aorta without evidence of an aneurysm nor dissection. There are areas of mural based thrombus within the descending aorta.  The lung parenchyma demonstrates ill-defined areas of increased density with a ground-glass appearance in the right upper lobe. Hypoventilation of the identified within the dependent portions of the right and left lower lobes. An area of atelectasis is appreciated within the left lung base is well is a trace pleural effusion.  Visualized upper abdominal viscera demonstrate atherosclerotic calcifications within the aorta and mesenteric vessels and otherwise unremarkable.  The osseous structures demonstrate no evidence of aggressive appearing osseous lesions.  Review of the MIP images confirms the above findings.  IMPRESSION: 1. Diffuse interstitial appearing infiltrate within the right upper lobe. 2. No CT evidence of pulmonary arterial embolic disease. 3. Increased dilation of the esophagus when compared to the previous study a component of mass effect secondary to the postsurgical changes in the region of the gastroesophageal junction is a diagnostic consideration. Further evaluation with direct visualization is recommended if clinically warranted a definitive soft tissue mass, nonsurgical, is not clearly identified though cannot be completely excluded. . 4. Atherosclerotic calcifications involving the coronary arteries and aorta Age advanced coronary artery atherosclerosis. Recommend assessment of coronary risk  factors and consideration of medical therapy.   Electronically Signed   By: Margaree Mackintosh M.D.   On: 10/13/2013 15:20     Assessment/Plan Active Problems:  Acute diastolic CHF (congestive heart failure) -As discussed above-sent EF 55-60%, and previous echo showed grade 1 diastolic dysfunction. Will diurese low-dose IV Lasix -Cycle cardiac enzymes -Monitor I.'s and O.'s and daily weights -Follow and change to by mouth Lasix when clinically appropriate   ANEMIA-IRON DEFICIENCY -Hemoglobin stable above, patient was started onsupplemental iron during her last hospitalization -No gross bleeding reported, Follow recheck and if remains stable>> outpatient followup with GI   Type 2 diabetes, uncontrolled, with neuropathy -Continue outpatient medications, monitor Accu-Cheks and cover with sliding scale insulin   Dementia -Continue outpatient medications   Anxiety state, unspecified -Continue outpatient medications   Weakness generalized -Cycle cardiac enzymes as above, obtain TSH -Consult PT OT Recent pneumonia and UTI -Status post completion of antibiotics       Code Status: full Family Communication: None at bedside Disposition Plan: Admit to telemetry  Time spent:>60mins  [facility] Pager [phone]

## 2013-10-13 NOTE — ED Notes (Signed)
Per EMS pt discharged 1/29 from hospital for diarrhea and treated for UTI/pneumonia.  Pt called EMS related to SOB with exertion. Pt normally able to ambulate independently. Pt NSR on monitor en route with EMS.

## 2013-10-13 NOTE — ED Notes (Signed)
IV team paged.  

## 2013-10-14 DIAGNOSIS — E1142 Type 2 diabetes mellitus with diabetic polyneuropathy: Secondary | ICD-10-CM

## 2013-10-14 DIAGNOSIS — R5381 Other malaise: Secondary | ICD-10-CM

## 2013-10-14 DIAGNOSIS — I10 Essential (primary) hypertension: Secondary | ICD-10-CM

## 2013-10-14 DIAGNOSIS — R5383 Other fatigue: Secondary | ICD-10-CM

## 2013-10-14 DIAGNOSIS — E1149 Type 2 diabetes mellitus with other diabetic neurological complication: Secondary | ICD-10-CM

## 2013-10-14 LAB — CBC
HEMATOCRIT: 23.6 % — AB (ref 36.0–46.0)
HEMOGLOBIN: 7.7 g/dL — AB (ref 12.0–15.0)
MCH: 28.6 pg (ref 26.0–34.0)
MCHC: 32.6 g/dL (ref 30.0–36.0)
MCV: 87.7 fL (ref 78.0–100.0)
Platelets: 323 10*3/uL (ref 150–400)
RBC: 2.69 MIL/uL — ABNORMAL LOW (ref 3.87–5.11)
RDW: 14 % (ref 11.5–15.5)
WBC: 6.1 10*3/uL (ref 4.0–10.5)

## 2013-10-14 LAB — TSH: TSH: 2.564 u[IU]/mL (ref 0.350–4.500)

## 2013-10-14 LAB — BASIC METABOLIC PANEL
BUN: 19 mg/dL (ref 6–23)
CO2: 24 meq/L (ref 19–32)
CREATININE: 1.38 mg/dL — AB (ref 0.50–1.10)
Calcium: 8.8 mg/dL (ref 8.4–10.5)
Chloride: 101 mEq/L (ref 96–112)
GFR calc Af Amer: 41 mL/min — ABNORMAL LOW (ref >90)
GFR calc non Af Amer: 35 mL/min — ABNORMAL LOW (ref >90)
Glucose, Bld: 150 mg/dL — ABNORMAL HIGH (ref 70–99)
Potassium: 3.8 mEq/L (ref 3.7–5.3)
Sodium: 138 mEq/L (ref 137–147)

## 2013-10-14 LAB — GLUCOSE, CAPILLARY
GLUCOSE-CAPILLARY: 198 mg/dL — AB (ref 70–99)
Glucose-Capillary: 200 mg/dL — ABNORMAL HIGH (ref 70–99)
Glucose-Capillary: 112 mg/dL — ABNORMAL HIGH (ref 70–99)
Glucose-Capillary: 245 mg/dL — ABNORMAL HIGH (ref 70–99)

## 2013-10-14 LAB — TROPONIN I
Troponin I: <0.3 ng/mL (ref <0.30)
Troponin I: <0.3 ng/mL (ref <0.30)

## 2013-10-14 MED ORDER — FUROSEMIDE 10 MG/ML IJ SOLN
20.0000 mg | Freq: Once | INTRAMUSCULAR | Status: AC
  Administered 2013-10-14: 20 mg via INTRAVENOUS
  Filled 2013-10-14: qty 4

## 2013-10-14 MED ORDER — INSULIN ASPART 100 UNIT/ML ~~LOC~~ SOLN
0.0000 [IU] | Freq: Three times a day (TID) | SUBCUTANEOUS | Status: DC
Start: 2013-10-14 — End: 2013-10-17
  Administered 2013-10-14: 2 [IU] via SUBCUTANEOUS
  Administered 2013-10-14: 3 [IU] via SUBCUTANEOUS
  Administered 2013-10-15: 2 [IU] via SUBCUTANEOUS
  Administered 2013-10-15: 3 [IU] via SUBCUTANEOUS
  Administered 2013-10-16 (×2): 1 [IU] via SUBCUTANEOUS
  Administered 2013-10-16: 3 [IU] via SUBCUTANEOUS
  Administered 2013-10-17: 1 [IU] via SUBCUTANEOUS
  Administered 2013-10-17: 3 [IU] via SUBCUTANEOUS

## 2013-10-14 MED ORDER — MEMANTINE HCL ER 7 MG PO CP24
14.0000 mg | ORAL_CAPSULE | Freq: Every day | ORAL | Status: DC
  Administered 2013-10-14 – 2013-10-17 (×4): 14 mg via ORAL
  Filled 2013-10-14 (×4): qty 2

## 2013-10-14 NOTE — Progress Notes (Signed)
[facility] PROGRESS NOTE  ROGAN NOA [id number] DOB: 09-23-1930 DOA: 10/13/2013 PCP: Cathlean Cower, MD  Assessment/Plan: Acute diastolic CHF (congestive heart failure)  -As discussed above-sent EF 55-60%, and previous echo showed grade 1 diastolic dysfunction. Will diurese low-dose IV Lasix  -Troponins negative x3 -Diuresing well negative 2 L over past 24 hours -Will give extra dose of Lasix iv, follow. ANEMIA-IRON DEFICIENCY  -Hemoglobin stable above, patient was started onsupplemental iron during her last hospitalization  -No gross bleeding reported, hemoglobin stable s>> outpatient followup with GI  Type 2 diabetes, uncontrolled, with neuropathy  -Continue outpatient medications, monitor Accu-Cheks and cover with sliding scale insulin  Dementia  -Continue outpatient medications  Anxiety state, unspecified  -Continue outpatient medications  Weakness generalized  -Cycle cardiac enzymes as above, obtain TSH  -Consult PT OT  Recent pneumonia and UTI  -Status post completion of antibiotics    Code Status: full Family Communication: None at bedside Disposition Plan: To SNF when medically ready   Consultants:  None  Procedures:  None  Antibiotics:  none  HPI/Subjective: States feeling better but still some shortness of breath  Objective: Filed Vitals:   10/14/13 1602  BP: 136/58  Pulse: 76  Temp: 98.2 F (36.8 C)  Resp:     Intake/Output Summary (Last 24 hours) at 10/14/13 1823 Last data filed at 10/14/13 1752  Gross per 24 hour  Intake    600 ml  Output   3375 ml  Net  -2775 ml   Filed Weights   10/13/13 1828 10/14/13 0558  Weight: 62.143 kg (137 lb) 62.188 kg (137 lb 1.6 oz)    Exam:  General: alert & appropriate In NAD Cardiovascular: RRR, nl S1 s2 Respiratory: Few left-sided crackles Abdomen: soft +BS NT/ND, no masses palpable Extremities: No cyanosis and no edema    Data Reviewed: Basic Metabolic Panel:  Recent Labs Lab  10/08/13 0545 10/09/13 0441 10/10/13 0537 10/13/13 1305 10/14/13 0215  NA 138 136* 141 139 138  K 3.7 4.3 3.8 3.6* 3.8  CL 101 100 103 102 101  CO2 24 25 26 22 24   GLUCOSE 109* 148* 170* 189* 150*  BUN 12 17 20 21 19   CREATININE 1.11* 1.22* 1.22* 1.29* 1.38*  CALCIUM 8.8 8.5 8.7 9.2 8.8   Liver Function Tests:  Recent Labs Lab 10/13/13 1305  AST 18  ALT 18  ALKPHOS 54  BILITOT <0.2*  PROT 7.1  ALBUMIN 2.9*   No results found for this basename: LIPASE, AMYLASE,  in the last 168 hours No results found for this basename: AMMONIA,  in the last 168 hours CBC:  Recent Labs Lab 10/08/13 0545 10/09/13 0441 10/10/13 0537 10/13/13 1305 10/14/13 0215  WBC 6.6 6.3 9.5 10.0 6.1  NEUTROABS  --   --   --  7.3  --   HGB 8.4* 7.6* 7.2* 7.5* 7.7*  HCT 26.4* 23.6* 22.4* 23.1* 23.6*  MCV 88.6 88.1 87.5 86.8 87.7  PLT 327 305 291 385 323   Cardiac Enzymes:  Recent Labs Lab 10/13/13 1305 10/13/13 2030 10/14/13 0215 10/14/13 0802  TROPONINI <0.30 <0.30 <0.30 <0.30   BNP (last 3 results)  Recent Labs  05/22/13 1640 10/13/13 1305  PROBNP 173.3 1431.0*   CBG:  Recent Labs Lab 10/10/13 0745 10/10/13 1146 10/13/13 2136 10/14/13 0807 10/14/13 1131  GLUCAP 140* 229* 200* 112* 198*    Recent Results (from the past 240 hour(s))  CLOSTRIDIUM DIFFICILE BY PCR     Status: None  Collection Time    10/05/13  7:48 PM      Result Value Range Status   C difficile by pcr NEGATIVE  NEGATIVE Final   Comment: Performed at [facility]     Status: None   Collection Time    10/05/13  9:11 PM      Result Value Range Status   Specimen Description URINE, CATHETERIZED   Final   Special Requests NONE   Final   Culture  Setup Time     Final   Value: 10/06/2013 10:56     Performed at [place]     Final   Value: NO GROWTH     Performed at [facility]   Culture     Final   Value: NO GROWTH     Performed at [facility]   Report Status 10/07/2013 FINAL   Final  CULTURE, BLOOD (ROUTINE X 2)     Status: None   Collection Time    10/07/13  5:03 PM      Result Value Range Status   Specimen Description BLOOD LEFT ARM   Final   Special Requests BOTTLES DRAWN AEROBIC AND ANAEROBIC 10CC   Final   Culture  Setup Time     Final   Value: 10/07/2013 20:20     Performed at [facility]   Culture     Final   Value: NO GROWTH 5 DAYS     Performed at [facility]   Report Status 10/13/2013 FINAL   Final  CULTURE, BLOOD (ROUTINE X 2)     Status: None   Collection Time    10/07/13  5:16 PM      Result Value Range Status   Specimen Description BLOOD RIGHT ARM   Final   Special Requests BOTTLES DRAWN AEROBIC AND ANAEROBIC 10CC   Final   Culture  Setup Time     Final   Value: 10/07/2013 20:20     Performed at [facility]   Culture     Final   Value: NO GROWTH 5 DAYS     Performed at [facility]   Report Status 10/13/2013 FINAL   Final     Studies: Dg Chest 2 View  10/13/2013   CLINICAL DATA:  Shortness of Breath  EXAM: CHEST  2 VIEW  COMPARISON:  October 07, 2013  FINDINGS: There are a few scattered calcified granulomas. Lungs are otherwise clear. Heart is upper normal in size with normal pulmonary vascularity. No adenopathy. There is atherosclerotic change in the aorta. No bone lesions.  IMPRESSION: Recent edema has cleared. Currently lungs are clear except for a few scattered small granulomas.   Electronically Signed   By: Lowella Grip M.D.   On: 10/13/2013 13:26   Ct Angio Chest Pe W/cm &/or Wo Cm  10/13/2013   CLINICAL DATA:  Shortness of breath, clinical concern of pulmonary arterial embolic disease  EXAM: CT ANGIOGRAPHY CHEST WITH CONTRAST  TECHNIQUE: Multidetector CT imaging of the chest was performed using the standard protocol during bolus administration of intravenous contrast. Multiplanar CT image reconstructions including MIPs were obtained to evaluate the  vascular anatomy.  CONTRAST:  72mL OMNIPAQUE IOHEXOL 350 MG/ML SOLN  COMPARISON:  DG CHEST 2 VIEW dated 10/13/2013; CT ANGIO CHEST W/CM &/OR WO/CM dated 10/23/2011  FINDINGS: The thoracic inlet is unremarkable. Multichamber cardiac enlargement is identified as well as coronary artery atherosclerotic calcifications.  The esophagus is not dilated and filled with debris, fluid and air. When compared to the previous study the dilation of the esophagus has increased. Measuring 2.6 cm in AP dimensions on image 28 series 4 correlated with a similar level on prior study would esophagus measured 2 cm.  The esophagus is dilated to the level of the gastroesophageal junction. There is lobulated soft tissue prominence at the level of the gastroesophageal junction. The patient has a history of prior Nissen and subsequent laparoscopic Nissen takedown. Postsurgical changes are identified within this region. When compared to the previous study. The lobulated soft tissue prominence appears stable.  There is no evidence of filling defects within the main, lobar, or segmental pulmonary arteries. Atherosclerotic calcification identified within the thoracic aorta without evidence of an aneurysm nor dissection. There are areas of mural based thrombus within the descending aorta.  The lung parenchyma demonstrates ill-defined areas of increased density with a ground-glass appearance in the right upper lobe. Hypoventilation of the identified within the dependent portions of the right and left lower lobes. An area of atelectasis is appreciated within the left lung base is well is a trace pleural effusion.  Visualized upper abdominal viscera demonstrate atherosclerotic calcifications within the aorta and mesenteric vessels and otherwise unremarkable.  The osseous structures demonstrate no evidence of aggressive appearing osseous lesions.  Review of the MIP images confirms the above findings.  IMPRESSION: 1. Diffuse interstitial appearing  infiltrate within the right upper lobe. 2. No CT evidence of pulmonary arterial embolic disease. 3. Increased dilation of the esophagus when compared to the previous study a component of mass effect secondary to the postsurgical changes in the region of the gastroesophageal junction is a diagnostic consideration. Further evaluation with direct visualization is recommended if clinically warranted a definitive soft tissue mass, nonsurgical, is not clearly identified though cannot be completely excluded. . 4. Atherosclerotic calcifications involving the coronary arteries and aorta Age advanced coronary artery atherosclerosis. Recommend assessment of coronary risk factors and consideration of medical therapy.   Electronically Signed   By: Margaree Mackintosh M.D.   On: 10/13/2013 15:20    Scheduled Meds: . amLODipine  10 mg Oral q morning - 10a  . atorvastatin  10 mg Oral QPC supper  . clopidogrel  75 mg Oral Q breakfast  . docusate sodium  100 mg Oral BID  . enoxaparin (LOVENOX) injection  30 mg Subcutaneous Q24H  . feeding supplement (GLUCERNA SHAKE)  237 mL Oral BID BM  . ferrous sulfate  325 mg Oral TID PC  . furosemide  20 mg Oral Daily  . insulin aspart  0-9 Units Subcutaneous TID WC  . insulin glargine  10 Units Subcutaneous BID  . megestrol  400 mg Oral Daily  . Memantine HCl ER  14 mg Oral Daily  . pantoprazole  40 mg Oral Daily  . sodium chloride  3 mL Intravenous Q12H  . sodium chloride  3 mL Intravenous Q12H  . tamsulosin  0.4 mg Oral q morning - 10a   Continuous Infusions:   Active Problems:   ANEMIA-IRON DEFICIENCY   Type 2 diabetes, uncontrolled, with neuropathy   Dementia   Anxiety state, unspecified   CHF (congestive heart failure)   Weakness generalized   Acute diastolic CHF (congestive heart failure)    Time spent: [facility] Pager [phone]. If 7PM-7AM, please contact night-coverage at [email or web address], password [facility] 10/14/2013, 6:23 PM   LOS: 1 day

## 2013-10-14 NOTE — Progress Notes (Signed)
PHARMACIST - PHYSICIAN COMMUNICATION  CONCERNING:  METFORMIN SAFE ADMINISTRATION POLICY  RECOMMENDATION: Metformin has been placed on DISCONTINUE (rejected order) STATUS and should be reordered only after any of the conditions below are ruled out.  Current safety recommendations include avoiding metformin for a minimum of 48 hours after the patient's exposure to intravenous contrast media.  DESCRIPTION:  The Pharmacy Committee has adopted a policy that restricts the use of metformin in hospitalized patients until all the contraindications to administration have been ruled out. Specific contraindications are: []  Serum creatinine ? 1.5 for males [x]  Serum creatinine ? 1.4 for females []  Shock, acute MI, sepsis, hypoxemia, dehydration [x]  Administration of intravenous iodinated contrast media  []  Heart Failure patients with low EF []  Acute or chronic metabolic acidosis (including DKA)   Allena Napoleon R.Ph. 10/14/2013 8:06 AM

## 2013-10-14 NOTE — Evaluation (Signed)
Physical Therapy Evaluation Patient Details Name: [name] MRN: [id number] DOB: May 12, 1931 Today's Date: 10/14/2013 Time: 1410-1444 PT Time Calculation (min): 34 min  PT Assessment / Plan / Recommendation History of Present Illness  [name] is a 80 y.o. female history of diabetes mellitus, hypertension, dementia, iron deficiency anemia, essentially discharged from hospital on 2/3 following hospitalization and treatment of diarrhea and, UTI pneumonia who presents with above complaints. She states she developed shortness of breath with minimal activity today and has been getting weaker. She denies chest pain, leg swelling, cough fevers and no dysuria. She was seen in the ED and CT angiogram of the chest negative for PE, infiltrate noted in her right upper lobe-same location as her recent pneumonia. Was found to have a hemoglobin of 7.5 which is improved from 7.2 on discharge. Her BNP was elevated at 1431 and urinalysis was negative for infection. Her recent echo of 10/06/13 showed and EF of 55-60%, and grade 1 diastolic dysfunction was noted on the echo of 05/2013. She was given IV Lasix in ED and is admitted for further evaluation and management. She has no prior history of CHF, it is noted that she was hydrated with IV fluids during her recent admission with diarrhea.  Clinical Impression  Pt's son present  And indicates pt will need SNF as pt has gotten weaker and is not mobile. Pt will benefit from PT to address problems to improve functional mobility.    PT Assessment  Patient needs continued PT services    Follow Up Recommendations  SNF    Does the patient have the potential to tolerate intense rehabilitation      Barriers to Discharge        Equipment Recommendations  None recommended by PT    Recommendations for Other Services     Frequency Min 3X/week    Precautions / Restrictions Precautions Precautions: Fall   Pertinent Vitals/Pain BP supine 132/59 137/49  after  Up to recliner.      Mobility  Bed Mobility Bed Mobility: Supine to Sit Supine to sit: Min assist General bed mobility comments: from flat bed Transfers Overall transfer level: Needs assistance Equipment used: Rolling walker (2 wheeled) Transfers: Sit to/from Stand Sit to Stand: Mod assist General transfer comment: VCs for hand placement Ambulation/Gait Ambulation/Gait assistance: Mod assist Ambulation Distance (Feet): 5 Feet Assistive device: Rolling walker (2 wheeled) Gait Pattern/deviations: Step-through pattern General Gait Details: pt could not progress due to dizziness.    Exercises     PT Diagnosis: Difficulty walking  PT Problem List: Decreased strength;Decreased activity tolerance;Decreased mobility;Decreased safety awareness PT Treatment Interventions: Gait training;Functional mobility training;Therapeutic activities;Therapeutic exercise;Patient/family education     PT Goals(Current goals can be found in the care plan section) Acute Rehab PT Goals Patient Stated Goal: none stated; agreeable to PT PT Goal Formulation: With patient/family Time For Goal Achievement: 10/28/13 Potential to Achieve Goals: Fair  Visit Information  Last PT Received On: 10/14/13 Assistance Needed: +2 History of Present Illness: [name] is a 80 y.o. female history of diabetes mellitus, hypertension, dementia, iron deficiency anemia, essentially discharged from hospital on 2/3 following hospitalization and treatment of diarrhea and, UTI pneumonia who presents with above complaints. She states she developed shortness of breath with minimal activity today and has been getting weaker. She denies chest pain, leg swelling, cough fevers and no dysuria. She was seen in the ED and CT angiogram of the chest negative for PE, infiltrate noted in her right upper  lobe-same location as her recent pneumonia. Was found to have a hemoglobin of 7.5 which is improved from 7.2 on discharge. Her  BNP was elevated at 1431 and urinalysis was negative for infection. Her recent echo of 10/06/13 showed and EF of 55-60%, and grade 1 diastolic dysfunction was noted on the echo of 05/2013. She was given IV Lasix in ED and is admitted for further evaluation and management. She has no prior history of CHF, it is noted that she was hydrated with IV fluids during her recent admission with diarrhea.       Prior [facility] [place] expects to be discharged to:: Skilled nursing facility Living Arrangements: Children Available Help at Discharge: Family Type of Home: Mobile home Home Access: Stairs to enter Entrance Stairs-Number of Steps: 6 Entrance Stairs-Rails: Right;Left Home Layout: One level Home Equipment: Grab bars - tub/shower;Shower seat;Cane - single point Additional Comments: made need RW , will continue to asess.     Cognition  Cognition Arousal/Alertness: Awake/alert Behavior During Therapy: WFL for tasks assessed/performed Overall Cognitive Status: History of cognitive impairments - at baseline    Extremity/Trunk Assessment Upper Extremity Assessment Upper Extremity Assessment: Generalized weakness Lower Extremity Assessment Lower Extremity Assessment: Generalized weakness   Balance    End of Session PT - End of Session Equipment Utilized During Treatment: Gait belt Activity Tolerance: Patient limited by fatigue Patient left: in chair;with call bell/phone within reach;with chair alarm set;with family/visitor present Nurse Communication: Mobility status  GP     Claretha Cooper 10/14/2013, 4:47 PM Tresa Endo PT [phone]

## 2013-10-14 NOTE — Progress Notes (Signed)
UR completed 

## 2013-10-15 LAB — BASIC METABOLIC PANEL
BUN: 18 mg/dL (ref 6–23)
CO2: 26 mEq/L (ref 19–32)
Calcium: 8.9 mg/dL (ref 8.4–10.5)
Chloride: 99 mEq/L (ref 96–112)
Creatinine, Ser: 1.52 mg/dL — ABNORMAL HIGH (ref 0.50–1.10)
GFR calc Af Amer: 36 mL/min — ABNORMAL LOW (ref >90)
GFR, EST NON AFRICAN AMERICAN: 31 mL/min — AB (ref >90)
Glucose, Bld: 267 mg/dL — ABNORMAL HIGH (ref 70–99)
POTASSIUM: 4.2 meq/L (ref 3.7–5.3)
SODIUM: 137 meq/L (ref 137–147)

## 2013-10-15 LAB — GLUCOSE, CAPILLARY
GLUCOSE-CAPILLARY: 109 mg/dL — AB (ref 70–99)
GLUCOSE-CAPILLARY: 236 mg/dL — AB (ref 70–99)
GLUCOSE-CAPILLARY: 194 mg/dL — AB (ref 70–99)
Glucose-Capillary: 220 mg/dL — ABNORMAL HIGH (ref 70–99)
Glucose-Capillary: 206 mg/dL — ABNORMAL HIGH (ref 70–99)

## 2013-10-15 NOTE — Progress Notes (Signed)
[facility] PROGRESS NOTE  [name] GBT:[id number] DOB: 1931-07-10 DOA: 10/13/2013 PCP: Cathlean Cower, MD  Assessment/Plan: Acute diastolic CHF (congestive heart failure)  -As discussed above-sent EF 55-60%, and previous echo showed grade 1 diastolic dysfunction. Will diurese low-dose IV Lasix  -Troponins negative x3 -continue po lasix, diuresing well improving clinically ANEMIA-IRON DEFICIENCY  -Hemoglobin stable above, patient was started onsupplemental iron during her last hospitalization  -No gross bleeding reported, hemoglobin stable s>> outpatient followup with GI  Type 2 diabetes, uncontrolled, with neuropathy  -Continue outpatient medications, monitor Accu-Cheks and cover with sliding scale insulin  Dementia  -Continue outpatient medications  Anxiety state, unspecified  -Continue outpatient medications  Weakness generalized  -Cycle cardiac enzymes as above, TSH wnl -Consult PT OT  Recent pneumonia and UTI  -Status post completion of antibiotics    Code Status: full Family Communication: Left voice mail for her son Disposition Plan: To SNF when medically ready   Consultants:  None  Procedures:  None  Antibiotics:  none  HPI/Subjective: States breathing better today, denies chest pain   Objective: Filed Vitals:   10/15/13 0622  BP: 143/62  Pulse: 89  Temp: 98.4 F (36.9 C)  Resp: 18    Intake/Output Summary (Last 24 hours) at 10/15/13 1207 Last data filed at 10/15/13 0843  Gross per 24 hour  Intake    360 ml  Output   1750 ml  Net  -1390 ml   Filed Weights   10/13/13 1828 10/14/13 0558 10/15/13 0622  Weight: 62.143 kg (137 lb) 62.188 kg (137 lb 1.6 oz) 60.51 kg (133 lb 6.4 oz)    Exam:  General: alert & appropriate In NAD Cardiovascular: RRR, nl S1 s2 Respiratory: Clear, no crackles or wheezes Abdomen: soft +BS NT/ND, no masses palpable Extremities: No cyanosis and no edema    Data Reviewed: Basic Metabolic Panel:  Recent  Labs Lab 10/09/13 0441 10/10/13 0537 10/13/13 1305 10/14/13 0215  NA 136* 141 139 138  K 4.3 3.8 3.6* 3.8  CL 100 103 102 101  CO2 25 26 22 24   GLUCOSE 148* 170* 189* 150*  BUN 17 20 21 19   CREATININE 1.22* 1.22* 1.29* 1.38*  CALCIUM 8.5 8.7 9.2 8.8   Liver Function Tests:  Recent Labs Lab 10/13/13 1305  AST 18  ALT 18  ALKPHOS 54  BILITOT <0.2*  PROT 7.1  ALBUMIN 2.9*   No results found for this basename: LIPASE, AMYLASE,  in the last 168 hours No results found for this basename: AMMONIA,  in the last 168 hours CBC:  Recent Labs Lab 10/09/13 0441 10/10/13 0537 10/13/13 1305 10/14/13 0215  WBC 6.3 9.5 10.0 6.1  NEUTROABS  --   --  7.3  --   HGB 7.6* 7.2* 7.5* 7.7*  HCT 23.6* 22.4* 23.1* 23.6*  MCV 88.1 87.5 86.8 87.7  PLT 305 291 385 323   Cardiac Enzymes:  Recent Labs Lab 10/13/13 1305 10/13/13 2030 10/14/13 0215 10/14/13 0802  TROPONINI <0.30 <0.30 <0.30 <0.30   BNP (last 3 results)  Recent Labs  05/22/13 1640 10/13/13 1305  PROBNP 173.3 1431.0*   CBG:  Recent Labs Lab 10/14/13 1131 10/14/13 1622 10/14/13 2253 10/15/13 0730 10/15/13 1147  GLUCAP 198* 245* 220* 109* 236*    Recent Results (from the past 240 hour(s))  CLOSTRIDIUM DIFFICILE BY PCR     Status: None   Collection Time    10/05/13  7:48 PM      Result Value Range Status  C difficile by pcr NEGATIVE  NEGATIVE Final   Comment: Performed at [place]     Status: None   Collection Time    10/05/13  9:11 PM      Result Value Range Status   Specimen Description URINE, CATHETERIZED   Final   Special Requests NONE   Final   Culture  Setup Time     Final   Value: 10/06/2013 10:56     Performed at [place]     Final   Value: NO GROWTH     Performed at [facility]   Culture     Final   Value: NO GROWTH     Performed at [facility]   Report Status 10/07/2013 FINAL   Final  CULTURE, BLOOD (ROUTINE X 2)      Status: None   Collection Time    10/07/13  5:03 PM      Result Value Range Status   Specimen Description BLOOD LEFT ARM   Final   Special Requests BOTTLES DRAWN AEROBIC AND ANAEROBIC 10CC   Final   Culture  Setup Time     Final   Value: 10/07/2013 20:20     Performed at [facility]   Culture     Final   Value: NO GROWTH 5 DAYS     Performed at [facility]   Report Status 10/13/2013 FINAL   Final  CULTURE, BLOOD (ROUTINE X 2)     Status: None   Collection Time    10/07/13  5:16 PM      Result Value Range Status   Specimen Description BLOOD RIGHT ARM   Final   Special Requests BOTTLES DRAWN AEROBIC AND ANAEROBIC 10CC   Final   Culture  Setup Time     Final   Value: 10/07/2013 20:20     Performed at [facility]   Culture     Final   Value: NO GROWTH 5 DAYS     Performed at [facility]   Report Status 10/13/2013 FINAL   Final     Studies: Dg Chest 2 View  10/13/2013   CLINICAL DATA:  Shortness of Breath  EXAM: CHEST  2 VIEW  COMPARISON:  October 07, 2013  FINDINGS: There are a few scattered calcified granulomas. Lungs are otherwise clear. Heart is upper normal in size with normal pulmonary vascularity. No adenopathy. There is atherosclerotic change in the aorta. No bone lesions.  IMPRESSION: Recent edema has cleared. Currently lungs are clear except for a few scattered small granulomas.   Electronically Signed   By: Lowella Grip M.D.   On: 10/13/2013 13:26   Ct Angio Chest Pe W/cm &/or Wo Cm  10/13/2013   CLINICAL DATA:  Shortness of breath, clinical concern of pulmonary arterial embolic disease  EXAM: CT ANGIOGRAPHY CHEST WITH CONTRAST  TECHNIQUE: Multidetector CT imaging of the chest was performed using the standard protocol during bolus administration of intravenous contrast. Multiplanar CT image reconstructions including MIPs were obtained to evaluate the vascular anatomy.  CONTRAST:  48mL OMNIPAQUE IOHEXOL 350 MG/ML SOLN  COMPARISON:  DG  CHEST 2 VIEW dated 10/13/2013; CT ANGIO CHEST W/CM &/OR WO/CM dated 10/23/2011  FINDINGS: The thoracic inlet is unremarkable. Multichamber cardiac enlargement is identified as well as coronary artery atherosclerotic calcifications.  The esophagus is not dilated and filled with debris, fluid and air. When compared to the previous study the  dilation of the esophagus has increased. Measuring 2.6 cm in AP dimensions on image 28 series 4 correlated with a similar level on prior study would esophagus measured 2 cm.  The esophagus is dilated to the level of the gastroesophageal junction. There is lobulated soft tissue prominence at the level of the gastroesophageal junction. The patient has a history of prior Nissen and subsequent laparoscopic Nissen takedown. Postsurgical changes are identified within this region. When compared to the previous study. The lobulated soft tissue prominence appears stable.  There is no evidence of filling defects within the main, lobar, or segmental pulmonary arteries. Atherosclerotic calcification identified within the thoracic aorta without evidence of an aneurysm nor dissection. There are areas of mural based thrombus within the descending aorta.  The lung parenchyma demonstrates ill-defined areas of increased density with a ground-glass appearance in the right upper lobe. Hypoventilation of the identified within the dependent portions of the right and left lower lobes. An area of atelectasis is appreciated within the left lung base is well is a trace pleural effusion.  Visualized upper abdominal viscera demonstrate atherosclerotic calcifications within the aorta and mesenteric vessels and otherwise unremarkable.  The osseous structures demonstrate no evidence of aggressive appearing osseous lesions.  Review of the MIP images confirms the above findings.  IMPRESSION: 1. Diffuse interstitial appearing infiltrate within the right upper lobe. 2. No CT evidence of pulmonary arterial embolic  disease. 3. Increased dilation of the esophagus when compared to the previous study a component of mass effect secondary to the postsurgical changes in the region of the gastroesophageal junction is a diagnostic consideration. Further evaluation with direct visualization is recommended if clinically warranted a definitive soft tissue mass, nonsurgical, is not clearly identified though cannot be completely excluded. . 4. Atherosclerotic calcifications involving the coronary arteries and aorta Age advanced coronary artery atherosclerosis. Recommend assessment of coronary risk factors and consideration of medical therapy.   Electronically Signed   By: Margaree Mackintosh M.D.   On: 10/13/2013 15:20    Scheduled Meds: . amLODipine  10 mg Oral q morning - 10a  . atorvastatin  10 mg Oral QPC supper  . clopidogrel  75 mg Oral Q breakfast  . docusate sodium  100 mg Oral BID  . enoxaparin (LOVENOX) injection  30 mg Subcutaneous Q24H  . feeding supplement (GLUCERNA SHAKE)  237 mL Oral BID BM  . ferrous sulfate  325 mg Oral TID PC  . furosemide  20 mg Oral Daily  . insulin aspart  0-9 Units Subcutaneous TID WC  . insulin glargine  10 Units Subcutaneous BID  . megestrol  400 mg Oral Daily  . Memantine HCl ER  14 mg Oral Daily  . pantoprazole  40 mg Oral Daily  . sodium chloride  3 mL Intravenous Q12H  . sodium chloride  3 mL Intravenous Q12H  . tamsulosin  0.4 mg Oral q morning - 10a   Continuous Infusions:   Active Problems:   ANEMIA-IRON DEFICIENCY   Type 2 diabetes, uncontrolled, with neuropathy   Dementia   Anxiety state, unspecified   CHF (congestive heart failure)   Weakness generalized   Acute diastolic CHF (congestive heart failure)    Time spent: [facility] Pager [phone]. If 7PM-7AM, please contact night-coverage at [email or web address], password [facility] 10/15/2013, 12:07 PM  LOS: 2 days

## 2013-10-15 NOTE — Progress Notes (Signed)
INITIAL NUTRITION ASSESSMENT  DOCUMENTATION CODES Per approved criteria  -severe malnutrition related to acute illness    INTERVENTION:  Continue Glucerna BID  Continue Megace  Educated on a low sodium diabetic diet.  Handouts on low sodium, CHO mod diet provided with label reading tips.  Teach back method used and patient able to verbalize.  NUTRITION DIAGNOSIS: Predicted sub optimal intake related to decreased appetite as evidenced by patient report..   Goal: Intake of >75% of meals and supplements to meet estimated needs.  Monitor:  Weight trend, labs, intake.  Reason for Assessment: MST  80 y.o. female  Admitting Dx: <principal problem not specified>  ASSESSMENT: Patient admitted with CHF after recent admit for diarrhea last week.  Hx includes DM, chronic anemia, HTN and dementia.      Patient reports decreased intake prior to admit.  Drinks Glucerna.  Lives with son and Daughter in law prepares meals.  Intentional weight loss of approximately 35-40 lbs through diet modification and portion control over the past several years.    Reports intake and appetite have improved since admit.  Megace has been added.    Patient now meets criteria for severe malnutrition related to 5% weight loss in the past week, <50% intake for >5 days and decreased muscle mass.  Nutrition Focused Physical Exam:  From last week. Subcutaneous Fat:  Orbital Region: WNL  Upper Arm Region: WNL  Thoracic and Lumbar Region: WNL  Muscle:  Temple Region: moderate  Clavicle Bone Region: WNL  Clavicle and Acromion Bone Region: WNL  Scapular Bone Region: WNL  Dorsal Hand: WNL  Patellar Region: WNL  Anterior Thigh Region: WNL  Posterior Calf Region: WNL  Edema: N/A  Nutrition Focused Physical Exam:  Today  Subcutaneous Fat:  Orbital Region: WNL  Upper Arm Region: WNL Thoracic and Lumbar Region: WNL  Muscle:  Temple Region: mild Clavicle Bone Region: mild Clavicle and Acromion Bone  Region: moderate Scapular Bone Region: moderate Dorsal Hand:WNL Patellar Region: mild Anterior Thigh Region: mild Posterior Calf Region: mild  Edema: yes   Height: Ht Readings from Last 1 Encounters:  10/13/13 5\' 3"  (1.6 m)    Weight: Wt Readings from Last 1 Encounters:  10/15/13 133 lb 6.4 oz (60.51 kg)    Ideal Body Weight: 115 lbs  % Ideal Body Weight: 95  Wt Readings from Last 10 Encounters:  10/15/13 133 lb 6.4 oz (60.51 kg)  10/05/13 140 lb 6.4 oz (63.685 kg)  10/04/13 144 lb (65.318 kg)  07/27/13 145 lb 8 oz (65.998 kg)  06/15/13 144 lb (65.318 kg)  06/02/13 142 lb (64.411 kg)  05/22/13 137 lb 12.8 oz (62.506 kg)  04/06/13 144 lb 4 oz (65.431 kg)  09/16/12 147 lb (66.679 kg)  07/29/12 150 lb (68.04 kg)    Usual Body Weight: 140 lbs last week  % Usual Body Weight: 95  BMI:  Body mass index is 23.64 kg/(m^2).  Estimated Nutritional Needs: Kcal: 1400-1600  Protein: 60-70 gm Fluid: 1.5L  Skin: WNL  Diet Order: Carb Control  EDUCATION NEEDS: -Education needs addressed   Intake/Output Summary (Last 24 hours) at 10/15/13 1205 Last data filed at 10/15/13 0843  Gross per 24 hour  Intake    360 ml  Output   1750 ml  Net  -1390 ml    Labs:   Recent Labs Lab 10/10/13 0537 10/13/13 1305 10/14/13 0215  NA 141 139 138  K 3.8 3.6* 3.8  CL 103 102 101  CO2 26 22  24  BUN 20 21 19   CREATININE 1.22* 1.29* 1.38*  CALCIUM 8.7 9.2 8.8  GLUCOSE 170* 189* 150*    CBG (last 3)   Recent Labs  10/14/13 2253 10/15/13 0730 10/15/13 1147  GLUCAP 220* 109* 236*    Scheduled Meds: . amLODipine  10 mg Oral q morning - 10a  . atorvastatin  10 mg Oral QPC supper  . clopidogrel  75 mg Oral Q breakfast  . docusate sodium  100 mg Oral BID  . enoxaparin (LOVENOX) injection  30 mg Subcutaneous Q24H  . feeding supplement (GLUCERNA SHAKE)  237 mL Oral BID BM  . ferrous sulfate  325 mg Oral TID PC  . furosemide  20 mg Oral Daily  . insulin aspart  0-9  Units Subcutaneous TID WC  . insulin glargine  10 Units Subcutaneous BID  . megestrol  400 mg Oral Daily  . Memantine HCl ER  14 mg Oral Daily  . pantoprazole  40 mg Oral Daily  . sodium chloride  3 mL Intravenous Q12H  . sodium chloride  3 mL Intravenous Q12H  . tamsulosin  0.4 mg Oral q morning - 10a    Continuous Infusions:   Past Medical History  Diagnosis Date  . COLONIC POLYPS 06/15/2005  . DIABETES MELLITUS, TYPE II 04/22/2007  . HYPERCHOLESTEROLEMIA   . HYPERLIPIDEMIA   . OBESITY   . ANEMIA-IRON DEFICIENCY   . DEPRESSION   . HYPERTENSION   . ALLERGIC RHINITIS   . ESOPHAGEAL STRICTURE   . GERD   . HIATAL HERNIA   . DIVERTICULOSIS, COLON   . BACK PAIN   . OSTEOPOROSIS   . DIZZINESS   . WEIGHT LOSS   . DYSPNEA   . Other dysphagia   . COLONIC POLYPS, HX OF 04/03/2002 & 06/15/2005    TUBULAR ADENOMA  . History of CVA (cerebrovascular accident) 07/29/2012    Noted old, to right thalamus and pons by Head CT Jul 27, 2012  . Dementia 09/17/2012  . Type II or unspecified type diabetes mellitus without mention of complication, uncontrolled 07/16/2012    Past Surgical History  Procedure Laterality Date  . Appendectomy    . Abdominal hysterectomy    . Tonsillectomy and adenoidectomy    . Left ankle surgury    . S/p bladder pubovaginal sling    . S/p cystocele/rectocele repair    . Laparoscopic takedown of incarcerated stomach within the chest/nissen  2009  . Esophageal manometry  12/21/2011    Procedure: ESOPHAGEAL MANOMETRY (EM);  Surgeon: Sable Feil, MD;  Location: [facility];  Service: Endoscopy;  Laterality: N/A;    Antonieta Iba, RD, LDN Clinical Inpatient Dietitian Pager:  [phone] Weekend and after hours pager:  [phone]

## 2013-10-16 DIAGNOSIS — R42 Dizziness and giddiness: Secondary | ICD-10-CM

## 2013-10-16 DIAGNOSIS — E43 Unspecified severe protein-calorie malnutrition: Secondary | ICD-10-CM | POA: Insufficient documentation

## 2013-10-16 LAB — BASIC METABOLIC PANEL
BUN: 16 mg/dL (ref 6–23)
CALCIUM: 8.8 mg/dL (ref 8.4–10.5)
CO2: 25 mEq/L (ref 19–32)
CREATININE: 1.29 mg/dL — AB (ref 0.50–1.10)
Chloride: 100 mEq/L (ref 96–112)
GFR, EST AFRICAN AMERICAN: 44 mL/min — AB (ref >90)
GFR, EST NON AFRICAN AMERICAN: 38 mL/min — AB (ref >90)
Glucose, Bld: 187 mg/dL — ABNORMAL HIGH (ref 70–99)
Potassium: 3.5 mEq/L — ABNORMAL LOW (ref 3.7–5.3)
SODIUM: 138 meq/L (ref 137–147)

## 2013-10-16 LAB — GLUCOSE, CAPILLARY
GLUCOSE-CAPILLARY: 121 mg/dL — AB (ref 70–99)
Glucose-Capillary: 132 mg/dL — ABNORMAL HIGH (ref 70–99)
Glucose-Capillary: 216 mg/dL — ABNORMAL HIGH (ref 70–99)
Glucose-Capillary: 174 mg/dL — ABNORMAL HIGH (ref 70–99)

## 2013-10-16 LAB — PRO B NATRIURETIC PEPTIDE: PRO B NATRI PEPTIDE: 718.6 pg/mL — AB (ref 0–450)

## 2013-10-16 MED ORDER — MECLIZINE HCL 12.5 MG PO TABS
12.5000 mg | ORAL_TABLET | Freq: Three times a day (TID) | ORAL | Status: DC
  Administered 2013-10-16 – 2013-10-17 (×4): 12.5 mg via ORAL
  Filled 2013-10-16 (×6): qty 1

## 2013-10-16 MED ORDER — FUROSEMIDE 20 MG PO TABS: 20.0000 mg | ORAL_TABLET | Freq: Once | ORAL | Status: DC

## 2013-10-16 NOTE — Progress Notes (Signed)
Clinical Social Work Department CLINICAL SOCIAL WORK PLACEMENT NOTE 10/16/2013  Patient:  [name], [name]  Account Number:  [id number] Admit date:  10/13/2013  Clinical Social Worker:  Renold Genta  Date/time:  10/16/2013 10:26 AM  Clinical Social Work is seeking post-discharge placement for this patient at the following level of care:   SKILLED NURSING   (*CSW will update this form in Epic as items are completed)   10/16/2013  Patient/family provided with [place] Department of Clinical Social Work's list of facilities offering this level of care within the geographic area requested by the patient (or if unable, by the patient's family).  10/16/2013  Patient/family informed of their freedom to choose among providers that offer the needed level of care, that participate in Medicare, Medicaid or managed care program needed by the patient, have an available bed and are willing to accept the patient.  10/16/2013  Patient/family informed of [facility]' ownership interest in [facility], as well as of the fact that they are under no obligation to receive care at this facility.  PASARR submitted to EDS on 10/16/2013 PASARR number received from EDS on 10/16/2013  FL2 transmitted to all facilities in geographic area requested by pt/family on  10/16/2013 FL2 transmitted to all facilities within larger geographic area on   Patient informed that his/her managed care company has contracts with or will negotiate with  certain facilities, including the following:     Patient/family informed of bed offers received:  10/16/2013 Patient chooses bed at [facility], [place] Physician recommends and patient chooses bed at    Patient to be transferred to [place] on   Patient to be transferred to facility by   The following physician request were entered in Epic:   Additional Comments:   [name], [place] Worker cell #: [phone]

## 2013-10-16 NOTE — Progress Notes (Signed)
[facility] PROGRESS NOTE  [name] OEU:[id number] DOB: [birth year] DOA: 10/13/2013 PCP: Cathlean Cower, MD  Assessment/Plan: Acute diastolic CHF (congestive heart failure)  -As discussed above- recent echo EF 55-60%, and previous echo showed grade 1 diastolic dysfunction. Will diurese low-dose IV Lasix  -Troponins negative x3 -continue po lasix, diuresing-well, down 5 pounds from admission improving clinically ANEMIA-IRON DEFICIENCY  -Hemoglobin stable above, patient was started onsupplemental iron during her last hospitalization  -No gross bleeding reported, hemoglobin stable s>> outpatient followup with GI  Type 2 diabetes, uncontrolled, with neuropathy  -Continue outpatient medications, monitor Accu-Cheks and cover with sliding scale insulin  Dementia  -Continue outpatient medications  Anxiety state, unspecified  -Continue outpatient medications  Weakness generalized  -Cycle cardiac enzymes as above, TSH wnl -Consult PT OT  Recent pneumonia and UTI  -Status post completion of antibiotics  Vertigo -Antivert, follow -No focal findings on exam -PT above  Code Status: full Family Communication: Left voice mail for her son Disposition Plan: To SNF when medically ready   Consultants:  None  Procedures:  None  Antibiotics:  none  HPI/Subjective: Patient complaining of vertigo and nausea this am, no focal weakness.  Objective: Filed Vitals:   10/16/13 1500  BP: 131/84  Pulse: 119  Temp: 98.2 F (36.8 C)  Resp: 18    Intake/Output Summary (Last 24 hours) at 10/16/13 1845 Last data filed at 10/16/13 1700  Gross per 24 hour  Intake    726 ml  Output   3250 ml  Net  -2524 ml   Filed Weights   10/14/13 0558 10/15/13 0622 10/16/13 0500  Weight: 62.188 kg (137 lb 1.6 oz) 60.51 kg (133 lb 6.4 oz) 60.056 kg (132 lb 6.4 oz)    Exam:  General: alert & appropriate In NAD Cardiovascular: RRR, nl S1 s2 Respiratory: Clear, no crackles or wheezes Abdomen:  soft +BS NT/ND, no masses palpable Extremities: No cyanosis and no edema    Data Reviewed: Basic Metabolic Panel:  Recent Labs Lab 10/10/13 0537 10/13/13 1305 10/14/13 0215 10/15/13 1145 10/16/13 0423  NA 141 139 138 137 138  K 3.8 3.6* 3.8 4.2 3.5*  CL 103 102 101 99 100  CO2 26 22 24 26 25   GLUCOSE 170* 189* 150* 267* 187*  BUN 20 21 19 18 16   CREATININE 1.22* 1.29* 1.38* 1.52* 1.29*  CALCIUM 8.7 9.2 8.8 8.9 8.8   Liver Function Tests:  Recent Labs Lab 10/13/13 1305  AST 18  ALT 18  ALKPHOS 54  BILITOT <0.2*  PROT 7.1  ALBUMIN 2.9*   No results found for this basename: LIPASE, AMYLASE,  in the last 168 hours No results found for this basename: AMMONIA,  in the last 168 hours CBC:  Recent Labs Lab 10/10/13 0537 10/13/13 1305 10/14/13 0215  WBC 9.5 10.0 6.1  NEUTROABS  --  7.3  --   HGB 7.2* 7.5* 7.7*  HCT 22.4* 23.1* 23.6*  MCV 87.5 86.8 87.7  PLT 291 385 323   Cardiac Enzymes:  Recent Labs Lab 10/13/13 1305 10/13/13 2030 10/14/13 0215 10/14/13 0802  TROPONINI <0.30 <0.30 <0.30 <0.30   BNP (last 3 results)  Recent Labs  05/22/13 1640 10/13/13 1305 10/16/13 0423  PROBNP 173.3 1431.0* 718.6*   CBG:  Recent Labs Lab 10/15/13 1634 10/15/13 2151 10/16/13 0726 10/16/13 1148 10/16/13 1617  GLUCAP 194* 206* 132* 121* 216*    Recent Results (from the past 240 hour(s))  CULTURE, BLOOD (ROUTINE X 2)  Status: None   Collection Time    10/07/13  5:03 PM      Result Value Range Status   Specimen Description BLOOD LEFT ARM   Final   Special Requests BOTTLES DRAWN AEROBIC AND ANAEROBIC 10CC   Final   Culture  Setup Time     Final   Value: 10/07/2013 20:20     Performed at [facility]   Culture     Final   Value: NO GROWTH 5 DAYS     Performed at [facility]   Report Status 10/13/2013 FINAL   Final  CULTURE, BLOOD (ROUTINE X 2)     Status: None   Collection Time    10/07/13  5:16 PM      Result Value Range Status    Specimen Description BLOOD RIGHT ARM   Final   Special Requests BOTTLES DRAWN AEROBIC AND ANAEROBIC 10CC   Final   Culture  Setup Time     Final   Value: 10/07/2013 20:20     Performed at [facility]   Culture     Final   Value: NO GROWTH 5 DAYS     Performed at [facility]   Report Status 10/13/2013 FINAL   Final     Studies: No results found.  Scheduled Meds: . amLODipine  10 mg Oral q morning - 10a  . atorvastatin  10 mg Oral QPC supper  . clopidogrel  75 mg Oral Q breakfast  . docusate sodium  100 mg Oral BID  . enoxaparin (LOVENOX) injection  30 mg Subcutaneous Q24H  . feeding supplement (GLUCERNA SHAKE)  237 mL Oral BID BM  . ferrous sulfate  325 mg Oral TID PC  . furosemide  20 mg Oral Daily  . insulin aspart  0-9 Units Subcutaneous TID WC  . insulin glargine  10 Units Subcutaneous BID  . meclizine  12.5 mg Oral TID  . megestrol  400 mg Oral Daily  . Memantine HCl ER  14 mg Oral Daily  . pantoprazole  40 mg Oral Daily  . sodium chloride  3 mL Intravenous Q12H  . sodium chloride  3 mL Intravenous Q12H  . tamsulosin  0.4 mg Oral q morning - 10a   Continuous Infusions:   Active Problems:   ANEMIA-IRON DEFICIENCY   Type 2 diabetes, uncontrolled, with neuropathy   Dementia   Anxiety state, unspecified   CHF (congestive heart failure)   Weakness generalized   Acute diastolic CHF (congestive heart failure)   Protein-calorie malnutrition, severe    Time spent: [facility] Pager [phone]. If 7PM-7AM, please contact night-coverage at [email or web address], password [facility] 10/16/2013, 6:45 PM  LOS: 3 days

## 2013-10-16 NOTE — Progress Notes (Signed)
Physical Therapy Treatment Patient Details Name: [name] MRN: [id number] DOB: [birth year] Today's Date: 10/16/2013 Time: 2376-2831 PT Time Calculation (min): 24 min  PT Assessment / Plan / Recommendation  History of Present Illness [name] is a 80 y.o. female history of diabetes mellitus, hypertension, dementia, iron deficiency anemia, essentially discharged from hospital on 2/3 following hospitalization and treatment of diarrhea and, UTI pneumonia who presents with above complaints. She states she developed shortness of breath with minimal activity today and has been getting weaker. She denies chest pain, leg swelling, cough fevers and no dysuria. She was seen in the ED and CT angiogram of the chest negative for PE, infiltrate noted in her right upper lobe-same location as her recent pneumonia. Was found to have a hemoglobin of 7.5 which is improved from 7.2 on discharge. Her BNP was elevated at 1431 and urinalysis was negative for infection. Her recent echo of 10/06/13 showed and EF of 55-60%, and grade 1 diastolic dysfunction was noted on the echo of 05/2013. She was given IV Lasix in ED and is admitted for further evaluation and management. She has no prior history of CHF, it is noted that she was hydrated with IV fluids during her recent admission with diarrhea.   PT Comments   Assisted pt OOB to amb in hallway several short distances due to limited activity tolerance and MAXZ c/o weakness/fatigue.   Follow Up Recommendations  SNF     Does the patient have the potential to tolerate intense rehabilitation     Barriers to Discharge        Equipment Recommendations       Recommendations for Other Services    Frequency Min 3X/week   Progress towards PT Goals Progress towards PT goals: Progressing toward goals  Plan      Precautions / Restrictions Precautions Precautions: Fall Restrictions Weight Bearing Restrictions: No   Pertinent Vitals/Pain     Mobility  Bed  Mobility Overal bed mobility: Needs Assistance Bed Mobility: Supine to Sit Supine to sit: Min assist General bed mobility comments: increased time Transfers Overall transfer level: Needs assistance Equipment used: Rolling walker (2 wheeled) Transfers: Sit to/from Stand Sit to Stand: Mod assist;Min assist General transfer comment: limited amb distance demon 3/4 DOE Ambulation/Gait Ambulation/Gait assistance: Mod assist Ambulation Distance (Feet): 30 Feet (10' x 3 sitting rest breaks) Assistive device: Rolling walker (2 wheeled) Gait velocity: decr General Gait Details: limited activity tolerance with MAX c/o fatigue/weakness.  Amb in hallway with chair following behind to allow sitting rest breaks.      PT Goals (current goals can now be found in the care plan section)    Visit Information  Last PT Received On: 10/16/13 Assistance Needed: +1 History of Present Illness: [name] is a 80 y.o. female history of diabetes mellitus, hypertension, dementia, iron deficiency anemia, essentially discharged from hospital on 2/3 following hospitalization and treatment of diarrhea and, UTI pneumonia who presents with above complaints. She states she developed shortness of breath with minimal activity today and has been getting weaker. She denies chest pain, leg swelling, cough fevers and no dysuria. She was seen in the ED and CT angiogram of the chest negative for PE, infiltrate noted in her right upper lobe-same location as her recent pneumonia. Was found to have a hemoglobin of 7.5 which is improved from 7.2 on discharge. Her BNP was elevated at 1431 and urinalysis was negative for infection. Her recent echo of 10/06/13 showed and EF of 55-60%,  and grade 1 diastolic dysfunction was noted on the echo of 05/2013. She was given IV Lasix in ED and is admitted for further evaluation and management. She has no prior history of CHF, it is noted that she was hydrated with IV fluids during her recent  admission with diarrhea.    Subjective Data      Cognition       Balance     End of Session PT - End of Session Equipment Utilized During Treatment: Gait belt Activity Tolerance: Patient limited by fatigue Patient left: in bed;with call bell/phone within reach   Rica Koyanagi  PTA [facility]  Acute  Rehab Pager      [phone]

## 2013-10-16 NOTE — Clinical Documentation Improvement (Signed)
10/15/13 Nutrition assessment noted w/ documentation for " severe malnutrition related to acute illness ". ..."Patient reports decreased intake prior to admit. Drinks Glucerna. Lives with son and Daughter in law prepares meals. Intentional weight loss of approximately 35-40 lbs through diet modification and portion control over the past several years.  Reports intake and appetite have improved since admit. Megace has been added."  For accurate Dx specificity & severity can noted Nutr DX & documentation be validated w/ cond being eval'd, mon'd & tx'd. Thank you  Possible Clinical Conditions? Severe Malnutrition   Protein Calorie Malnutrition Severe Protein Calorie Malnutrition  Other Condition Cannot clinically determine  Supporting Information: Risk Factors: see Nutr Eval 10/15/13 Signs & Symptoms: see Nutr Eval 10/15/13 Diagnostics: see Nutr Eval 10/15/13 Treatment: see Nutr Eval 10/15/13  Thank You, Ermelinda Das, RN, BSN, CCDS Certified Clinical Documentation Specialist Pager: [place] [facility]

## 2013-10-16 NOTE — Progress Notes (Signed)
OT Cancellation Note  Patient Details Name: [name] MRN: [id number] DOB: 09/28/1930   Noted pt going to SNF today. Will defer OT eval to SNF. Thanks, Betsy Pries 10/16/2013, 12:07 PM

## 2013-10-16 NOTE — Progress Notes (Signed)
Clinical Social Work Department BRIEF PSYCHOSOCIAL ASSESSMENT 10/16/2013  Patient:  [name], [name]     Account Number:  [id number]     Admit date:  10/13/2013  Clinical Social Worker:  Renold Genta  Date/Time:  10/16/2013 10:11 AM  Referred by:  Physician  Date Referred:  10/16/2013 Referred for  SNF Placement   Other Referral:   Interview type:  Patient Other interview type:   and son    PSYCHOSOCIAL DATA Living Status:  ALONE Admitted from facility:   Level of care:   Primary support name:  Abril Cappiello (son) c#: [phone] Primary support relationship to patient:  CHILD, ADULT Degree of support available:   good    CURRENT CONCERNS Current Concerns  Post-Acute Placement   Other Concerns:    SOCIAL WORK ASSESSMENT / PLAN CSW received consult for SNF placement per son's request.   Assessment/plan status:  Information/Referral to [employer] Other assessment/ plan:   Information/referral to community resources:   CSW completed FL2 and faxed information to [facility], provided bed offers to patient & son who accepted bed offer @ [facility].    PATIENT'S/FAMILY'S RESPONSE TO PLAN OF CARE: Patient & son are in agreement with SNF placement for rehab, patient states that she lives alone and does not feel comfortable going back home at this time. CSW confirmed with Ebony Hail @ [facility] that they would be able to take patient today. Dr. Dillard Essex aware. Son to complete admission paperwork @ [facility] at 11:30a.       [name], [place] Worker cell #: [phone]

## 2013-10-17 DIAGNOSIS — F329 Major depressive disorder, single episode, unspecified: Secondary | ICD-10-CM

## 2013-10-17 DIAGNOSIS — N179 Acute kidney failure, unspecified: Secondary | ICD-10-CM

## 2013-10-17 DIAGNOSIS — E43 Unspecified severe protein-calorie malnutrition: Secondary | ICD-10-CM

## 2013-10-17 DIAGNOSIS — F3289 Other specified depressive episodes: Secondary | ICD-10-CM

## 2013-10-17 LAB — BASIC METABOLIC PANEL
BUN: 17 mg/dL (ref 6–23)
CHLORIDE: 100 meq/L (ref 96–112)
CO2: 25 mEq/L (ref 19–32)
Calcium: 8.7 mg/dL (ref 8.4–10.5)
Creatinine, Ser: 1.22 mg/dL — ABNORMAL HIGH (ref 0.50–1.10)
GFR calc non Af Amer: 41 mL/min — ABNORMAL LOW (ref >90)
GFR, EST AFRICAN AMERICAN: 47 mL/min — AB (ref >90)
Glucose, Bld: 199 mg/dL — ABNORMAL HIGH (ref 70–99)
POTASSIUM: 3.8 meq/L (ref 3.7–5.3)
SODIUM: 137 meq/L (ref 137–147)

## 2013-10-17 LAB — GLUCOSE, CAPILLARY
GLUCOSE-CAPILLARY: 125 mg/dL — AB (ref 70–99)
GLUCOSE-CAPILLARY: 213 mg/dL — AB (ref 70–99)

## 2013-10-17 MED ORDER — FUROSEMIDE 20 MG PO TABS: 20.0000 mg | ORAL_TABLET | Freq: Every day | ORAL | Status: DC

## 2013-10-17 MED ORDER — MECLIZINE HCL 12.5 MG PO TABS: 12.5000 mg | ORAL_TABLET | Freq: Three times a day (TID) | ORAL | Status: DC

## 2013-10-17 NOTE — Discharge Summary (Addendum)
Physician Discharge Summary  MACHELLE RAYBON RKY:[id number] DOB: [birth year] DOA: 10/13/2013  PCP: Cathlean Cower, MD  Admit date: 10/13/2013 Discharge date: 10/17/2013  Time spent: >30 minutes  Recommendations for Outpatient Follow-up:  Follow-up Information   Follow up with Minus Breeding, MD. (in [place], call for appt upon discharge)    Specialty:  Cardiology   Contact information:   [address], [place] [address] [phone]       Please follow up. (Urologist on Thurs 2/12 as previously scheduled)       Please follow up. (SNF M.D. in one to 2 days)        Discharge Diagnoses:  Active Problems: Acute diastolic CHF (congestive heart failure) Vertigo, peripheral  ANEMIA-IRON DEFICIENCY  Type 2 diabetes, uncontrolled, with neuropathy  Dementia  Anxiety state, unspecified  Weakness generalized   Protein-calorie malnutrition, severe    Discharge Condition: Improved/stable  Diet recommendation: Low sodium modified carbohydrate diet  Filed Weights   10/15/13 0622 10/16/13 0500 10/17/13 0447  Weight: 60.51 kg (133 lb 6.4 oz) 60.056 kg (132 lb 6.4 oz) 60.2 kg (132 lb 11.5 oz)    History of present illness:  [name] is a 80 y.o. female history of diabetes mellitus, hypertension, dementia, iron deficiency anemia, essentially discharged from hospital on 2/3 following hospitalization and treatment of diarrhea and, UTI pneumonia who presents with above complaints. She states she developed shortness of breath with minimal activity today and has been getting weaker. She denies chest pain, leg swelling, cough fevers and no dysuria. She was seen in the ED and CT angiogram of the chest negative for PE, infiltrate noted in her right upper lobe-same location as her recent pneumonia. Was found to have a hemoglobin of 7.5 which is improved from 7.2 on discharge. Her BNP was elevated at 1431 and urinalysis was negative for infection. Her  recent echo of 10/06/13 showed and EF of 55-60%, and grade 1 diastolic dysfunction was noted on the echo of 05/2013. She was given IV Lasix in ED and is admitted for further evaluation and management. She has no prior history of CHF, it is noted that she was hydrated with IV fluids during her recent admission with diarrhea.      Hospital Course:  Acute diastolic CHF (congestive heart failure)  -As discussed above-sent EF 55-60%, and previous echo showed grade 1 diastolic dysfunction.  -Upon admission patient was started on diuresis with IV Lasix and as she improved clinically she was switched to by mouth Lasix -Cycle cardiac enzymes in back negative  - I.'s and O.'s and daily weights were monitored, her discharge weight today is 132lbs(from 137 lbs on admission) -She is medically ready for discharge on oral Lasix at this time and she is to followup with Dr. Genella Mech outpatient [facility] DEFICIENCY  -Hemoglobin stable above, patient was started on supplemental iron during her last hospitalization  -No gross bleeding reported, she is to followup outpatient with his PCP and to be referred outpatient  GI as clinically appropriate Type 2 diabetes, uncontrolled, with neuropathy  -Continue outpatient medications, monitor Accu-Cheks and cover with sliding scale insulin  Dementia  -Continue outpatient medications  Anxiety state, unspecified  -Continue outpatient medications  Weakness generalized  -Cycle cardiac enzymes as above, her TSH was within normal limits -PT/OT saw her in the hospital and recommended SNF for rehabilitation Recent pneumonia and UTI  -Status post completion of antibiotics Vertigo, peripheral -Patient reported vertigo in the hospital with  nausea and was started on Antivert  -clinically improved on antivert, denies any further vertigo today and tolerating by mouth well. Urinary retention -Continue Flomax on discharge, follow up with urologist as previously scheduled on  Thursday 2/12  Procedures: none  Consultations:  none  Discharge Exam: Filed Vitals:   10/17/13 0447  BP: 131/61  Pulse: 90  Temp: 98.2 F (36.8 C)  Resp: 18   Exam:  General: alert & appropriate In NAD  Cardiovascular: RRR, nl S1 s2  Respiratory: Clear, no crackles or wheezes  Abdomen: soft +BS NT/ND, no masses palpable  Extremities: No cyanosis and no edema   Discharge Instructions  Discharge Orders   Future Appointments Provider Department Dept Phone   12/14/2013 10:00 AM Dennie Bible, NP [facility] [phone]   04/04/2014 10:00 AM Biagio Borg, MD [place] [phone]   Future Orders Complete By Expires   Diet Carb Modified  As directed    Increase activity slowly  As directed        Medication List    STOP taking these medications       levofloxacin 750 MG tablet  Commonly known as:  LEVAQUIN      TAKE these medications       amLODipine 10 MG tablet  Commonly known as:  NORVASC  Take 10 mg by mouth every morning.     atorvastatin 10 MG tablet  Commonly known as:  LIPITOR  Take 10 mg by mouth every morning.     cetirizine 10 MG tablet  Commonly known as:  ZYRTEC  Take 10 mg by mouth every morning.     clonazePAM 0.25 MG disintegrating tablet  Commonly known as:  KLONOPIN  Take 0.25 mg by mouth 2 (two) times daily as needed (anxiety).     clopidogrel 75 MG tablet  Commonly known as:  PLAVIX  Take 75 mg by mouth daily with breakfast.     DSS 100 MG Caps  Take 100 mg by mouth 2 (two) times daily.     esomeprazole 20 MG capsule  Commonly known as:  NEXIUM 24HR  Take 1 capsule (20 mg total) by mouth daily before breakfast.     feeding supplement (GLUCERNA SHAKE) Liqd  Take 237 mLs by mouth 2 (two) times daily between meals.     ferrous sulfate 325 (65 FE) MG tablet  Take 1 tablet (325 mg total) by mouth 3 (three) times daily after meals.     furosemide 20 MG tablet  Commonly known  as:  LASIX  Take 1 tablet (20 mg total) by mouth daily.     insulin glargine 100 UNIT/ML injection  Commonly known as:  LANTUS  Inject 10 Units into the skin 2 (two) times daily.     meclizine 12.5 MG tablet  Commonly known as:  ANTIVERT  Take 1 tablet (12.5 mg total) by mouth 3 (three) times daily.     megestrol 400 MG/10ML suspension  Commonly known as:  MEGACE  Take 10 mLs (400 mg total) by mouth daily.     MELATONIN PO  Take 1 tablet by mouth at bedtime as needed (sleep).     metFORMIN 500 MG tablet  Commonly known as:  GLUCOPHAGE  Take 1 tablet (500 mg total) by mouth 2 (two) times daily with a meal.     NAMENDA XR 28 MG Cp24  Generic drug:  Memantine HCl ER  Take 1 capsule by mouth every morning.  tamsulosin 0.4 MG Caps capsule  Commonly known as:  FLOMAX  Take 0.4 mg by mouth every morning.       Allergies  Allergen Reactions  . Keflex [Cephalexin] Other (See Comments)    Pt and her son do not know what the reaction is to this medication  . Codeine Nausea And Vomiting and Rash  . Hydrocodone-Acetaminophen Nausea And Vomiting and Rash  . Oxycodone-Acetaminophen Nausea And Vomiting and Rash      The results of significant diagnostics from this hospitalization (including imaging, microbiology, ancillary and laboratory) are listed below for reference.    Significant Diagnostic Studies: Dg Chest 2 View  10/13/2013   CLINICAL DATA:  Shortness of Breath  EXAM: CHEST  2 VIEW  COMPARISON:  October 07, 2013  FINDINGS: There are a few scattered calcified granulomas. Lungs are otherwise clear. Heart is upper normal in size with normal pulmonary vascularity. No adenopathy. There is atherosclerotic change in the aorta. No bone lesions.  IMPRESSION: Recent edema has cleared. Currently lungs are clear except for a few scattered small granulomas.   Electronically Signed   By: Lowella Grip M.D.   On: 10/13/2013 13:26   Dg Pelvis 1-2 Views  10/06/2013   CLINICAL DATA:   Fall, hip pain  EXAM: PELVIS - 1-2 VIEW  COMPARISON:  None.  FINDINGS: Diffuse degenerative changes of the lower lumbar spine with an associated scoliosis as before. Mild SI joint arthropathy. Bones are osteopenic. Pelvis and hips appear symmetric and intact. No displaced fracture.  IMPRESSION: Degenerative changes of the spine.  Osteopenia.  No acute displaced fracture   Electronically Signed   By: Daryll Brod M.D.   On: 10/06/2013 00:01   Ct Head Wo Contrast  10/06/2013   CLINICAL DATA:  Fall, confusion, hypertension, diabetes, dementia, CVA 2013.  EXAM: CT HEAD WITHOUT CONTRAST  TECHNIQUE: Contiguous axial images were obtained from the base of the skull through the vertex without intravenous contrast.  COMPARISON:  05/22/2013 MRI, 05/22/2013 CT  FINDINGS: Similar prominence of the sulci, cisterns, and ventricles, in keeping with volume loss. Periventricular and subcortical white matter hypodensities are similar to prior and in keeping with chronic microangiopathic change. A remote right thalamic lacunar infarction is suspected. No CT evidence of a cortical based (large artery) infarct. No intraparenchymal hemorrhage, mass, mass effect, or abnormal extra-axial fluid collection. No hydrocephalus. Atherosclerotic vascular calcifications. Bilateral maxillary sinus mucosal thickening and mucous retention cysts. Mastoid air cells are clear. No displaced calvarial fracture.  IMPRESSION: Volume loss and white matter changes are similar to prior. Remote right thalamic lacunar infarction. No CT evidence of acute intracranial abnormality.   Electronically Signed   By: Carlos Levering M.D.   On: 10/06/2013 00:01   Ct Angio Chest Pe W/cm &/or Wo Cm  10/13/2013   CLINICAL DATA:  Shortness of breath, clinical concern of pulmonary arterial embolic disease  EXAM: CT ANGIOGRAPHY CHEST WITH CONTRAST  TECHNIQUE: Multidetector CT imaging of the chest was performed using the standard protocol during bolus administration of  intravenous contrast. Multiplanar CT image reconstructions including MIPs were obtained to evaluate the vascular anatomy.  CONTRAST:  75mL OMNIPAQUE IOHEXOL 350 MG/ML SOLN  COMPARISON:  DG CHEST 2 VIEW dated 10/13/2013; CT ANGIO CHEST W/CM &/OR WO/CM dated 10/23/2011  FINDINGS: The thoracic inlet is unremarkable. Multichamber cardiac enlargement is identified as well as coronary artery atherosclerotic calcifications.  The esophagus is not dilated and filled with debris, fluid and air. When compared to the previous study the  dilation of the esophagus has increased. Measuring 2.6 cm in AP dimensions on image 28 series 4 correlated with a similar level on prior study would esophagus measured 2 cm.  The esophagus is dilated to the level of the gastroesophageal junction. There is lobulated soft tissue prominence at the level of the gastroesophageal junction. The patient has a history of prior Nissen and subsequent laparoscopic Nissen takedown. Postsurgical changes are identified within this region. When compared to the previous study. The lobulated soft tissue prominence appears stable.  There is no evidence of filling defects within the main, lobar, or segmental pulmonary arteries. Atherosclerotic calcification identified within the thoracic aorta without evidence of an aneurysm nor dissection. There are areas of mural based thrombus within the descending aorta.  The lung parenchyma demonstrates ill-defined areas of increased density with a ground-glass appearance in the right upper lobe. Hypoventilation of the identified within the dependent portions of the right and left lower lobes. An area of atelectasis is appreciated within the left lung base is well is a trace pleural effusion.  Visualized upper abdominal viscera demonstrate atherosclerotic calcifications within the aorta and mesenteric vessels and otherwise unremarkable.  The osseous structures demonstrate no evidence of aggressive appearing osseous lesions.   Review of the MIP images confirms the above findings.  IMPRESSION: 1. Diffuse interstitial appearing infiltrate within the right upper lobe. 2. No CT evidence of pulmonary arterial embolic disease. 3. Increased dilation of the esophagus when compared to the previous study a component of mass effect secondary to the postsurgical changes in the region of the gastroesophageal junction is a diagnostic consideration. Further evaluation with direct visualization is recommended if clinically warranted a definitive soft tissue mass, nonsurgical, is not clearly identified though cannot be completely excluded. . 4. Atherosclerotic calcifications involving the coronary arteries and aorta Age advanced coronary artery atherosclerosis. Recommend assessment of coronary risk factors and consideration of medical therapy.   Electronically Signed   By: Salome Holmes M.D.   On: 10/13/2013 15:20   Dg Chest Port 1 View  10/07/2013   CLINICAL DATA:  Hypoxia, fever  EXAM: PORTABLE CHEST - 1 VIEW  COMPARISON:  05/22/2013  FINDINGS: Ill-defined airspace opacity in the right upper lobe. Ill-defined focal airspace opacities in bilateral lower lobes. Mild diffuse interstitial prominence. Heart size upper limits normal. Old fracture deformity of the proximal left humerus. . No effusion.  IMPRESSION: New asymmetric right upper lobe and bilateral lower lobe airspace opacities suggesting multifocal pneumonia, less likely atypical edema.   Electronically Signed   By: Oley Balm M.D.   On: 10/07/2013 14:59    Microbiology: Recent Results (from the past 240 hour(s))  CULTURE, BLOOD (ROUTINE X 2)     Status: None   Collection Time    10/07/13  5:03 PM      Result Value Range Status   Specimen Description BLOOD LEFT ARM   Final   Special Requests BOTTLES DRAWN AEROBIC AND ANAEROBIC 10CC   Final   Culture  Setup Time     Final   Value: 10/07/2013 20:20     Performed at [facility]   Culture     Final   Value: NO GROWTH 5  DAYS     Performed at [facility]   Report Status 10/13/2013 FINAL   Final  CULTURE, BLOOD (ROUTINE X 2)     Status: None   Collection Time    10/07/13  5:16 PM      Result Value Range Status  Specimen Description BLOOD RIGHT ARM   Final   Special Requests BOTTLES DRAWN AEROBIC AND ANAEROBIC 10CC   Final   Culture  Setup Time     Final   Value: 10/07/2013 20:20     Performed at [facility]   Culture     Final   Value: NO GROWTH 5 DAYS     Performed at [facility]   Report Status 10/13/2013 FINAL   Final     Labs: Basic Metabolic Panel:  Recent Labs Lab 10/13/13 1305 10/14/13 0215 10/15/13 1145 10/16/13 0423 10/17/13 0402  NA 139 138 137 138 137  K 3.6* 3.8 4.2 3.5* 3.8  CL 102 101 99 100 100  CO2 22 24 26 25 25   GLUCOSE 189* 150* 267* 187* 199*  BUN 21 19 18 16 17   CREATININE 1.29* 1.38* 1.52* 1.29* 1.22*  CALCIUM 9.2 8.8 8.9 8.8 8.7   Liver Function Tests:  Recent Labs Lab 10/13/13 1305  AST 18  ALT 18  ALKPHOS 54  BILITOT <0.2*  PROT 7.1  ALBUMIN 2.9*   No results found for this basename: LIPASE, AMYLASE,  in the last 168 hours No results found for this basename: AMMONIA,  in the last 168 hours CBC:  Recent Labs Lab 10/13/13 1305 10/14/13 0215  WBC 10.0 6.1  NEUTROABS 7.3  --   HGB 7.5* 7.7*  HCT 23.1* 23.6*  MCV 86.8 87.7  PLT 385 323   Cardiac Enzymes:  Recent Labs Lab 10/13/13 1305 10/13/13 2030 10/14/13 0215 10/14/13 0802  TROPONINI <0.30 <0.30 <0.30 <0.30   BNP: BNP (last 3 results)  Recent Labs  05/22/13 1640 10/13/13 1305 10/16/13 0423  PROBNP 173.3 1431.0* 718.6*   CBG:  Recent Labs Lab 10/16/13 0726 10/16/13 1148 10/16/13 1617 10/16/13 2107 10/17/13 0757  GLUCAP 132* 121* 216* 174* 125*       Signed:  Jaelen Soth C  [facility] 10/17/2013, 11:51 AM

## 2013-10-17 NOTE — Progress Notes (Signed)
Report called to [place] at [facility]. Discharge instructions reviewed and all questions answered. Callie Fielding RN

## 2013-10-17 NOTE — Progress Notes (Signed)
Patient is set to discharge to [facility] today. Patient & son at bedside aware. Discharge packet in [place], [place] aware. Son to transport to facility.   Clinical Social Work Department CLINICAL SOCIAL WORK PLACEMENT NOTE 10/17/2013  Patient:  [name], [name]  Account Number:  [id number] Admit date:  10/13/2013  Clinical Social Worker:  Renold Genta  Date/time:  10/16/2013 10:26 AM  Clinical Social Work is seeking post-discharge placement for this patient at the following level of care:   SKILLED NURSING   (*CSW will update this form in Epic as items are completed)   10/16/2013  Patient/family provided with [facility] Department of Clinical Social Work's list of facilities offering this level of care within the geographic area requested by the patient (or if unable, by the patient's family).  10/16/2013  Patient/family informed of their freedom to choose among providers that offer the needed level of care, that participate in Medicare, Medicaid or managed care program needed by the patient, have an available bed and are willing to accept the patient.  10/16/2013  Patient/family informed of [facility]' ownership interest in [facility], as well as of the fact that they are under no obligation to receive care at this facility.  PASARR submitted to EDS on 10/16/2013 PASARR number received from EDS on 10/16/2013  FL2 transmitted to all facilities in geographic area requested by pt/family on  10/16/2013 FL2 transmitted to all facilities within larger geographic area on   Patient informed that his/her managed care company has contracts with or will negotiate with  certain facilities, including the following:     Patient/family informed of bed offers received:  10/16/2013 Patient chooses bed at [facility] [place] Physician recommends and patient chooses bed at    Patient to be transferred to [place] on  10/17/2013 Patient to be transferred to facility by son's car  The following physician request were entered in Epic:   Additional Comments:   [name], [place] Worker cell #: [phone]

## 2013-11-21 IMAGING — RF DG ESOPHAGUS
9 of 14 series · 14 of 24 positions shown · non-contrast
Comparison: CT chest of 10/23/2011

CLINICAL DATA: Dysphagia, some vomiting

ESOPHOGRAM/BARIUM SWALLOW
TECHNIQUE: Combined double contrast and single contrast
examination performed using effervescent crystals, thick barium
liquid, and thin barium liquid.
Fluoroscopy time:  2.4 minutes.

[Series 1: run · 1 of 1 slices shown (1 of 9)]
[im 1/1]
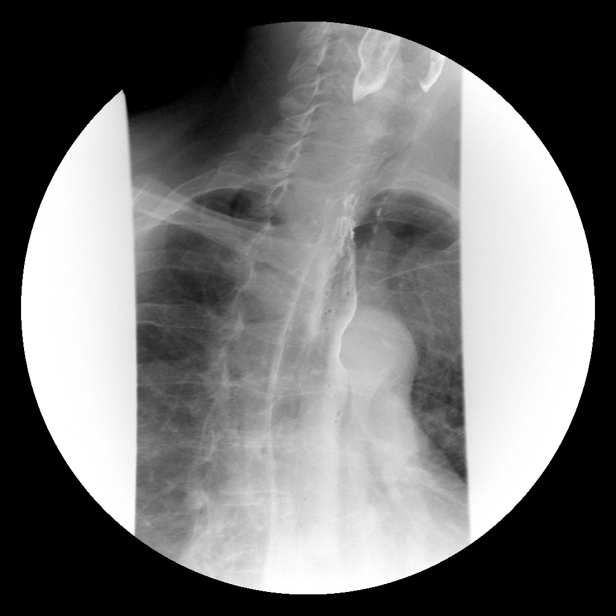

[Series 3: run · 1 of 1 slices shown (2 of 9)]
[im 1/1]
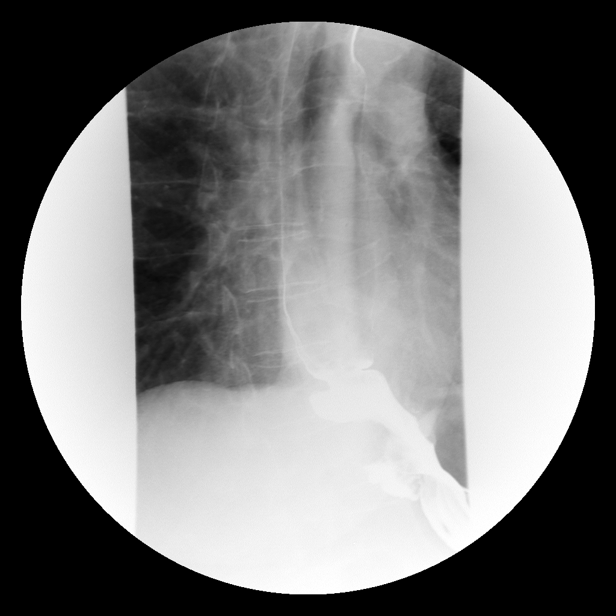

[Series 5: run · 1 of 1 slices shown (3 of 9)]
[im 1/1]
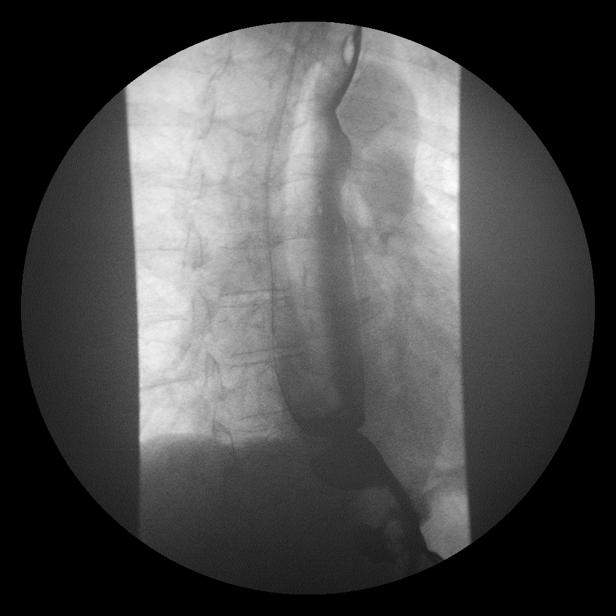

[Series 7: run · 3 of 10 slices shown (4 of 9)]
[im 1/10]
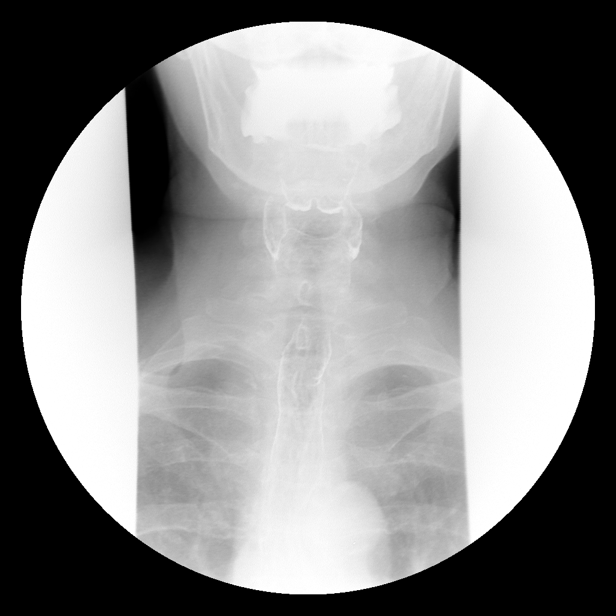
[im 3/10]
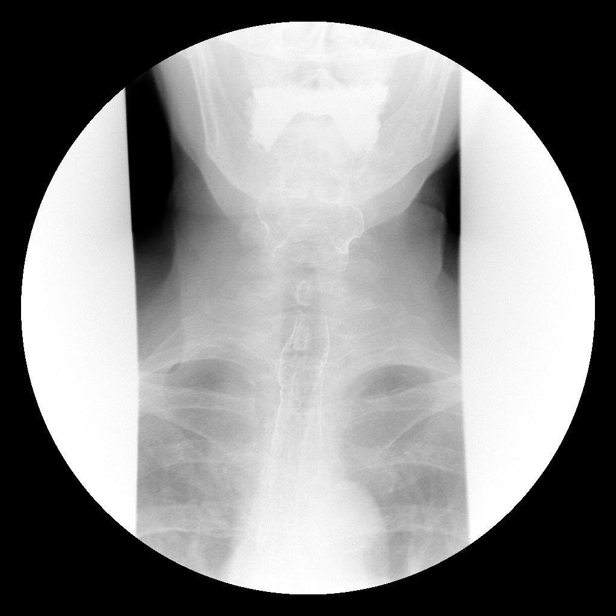
[im 7/10]
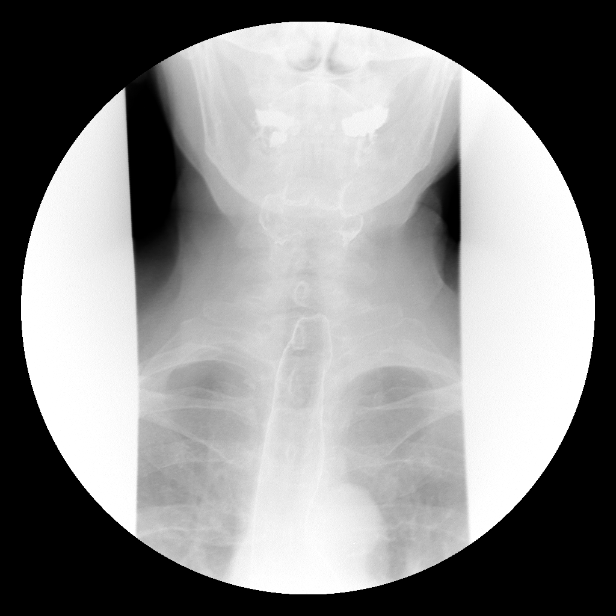

[Series 8: run · 4 of 11 slices shown (5 of 9)]
[im 1/11]
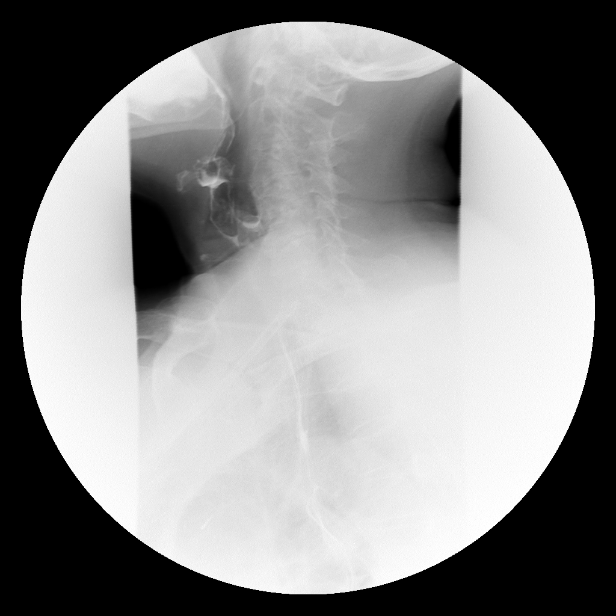
[im 2/11]
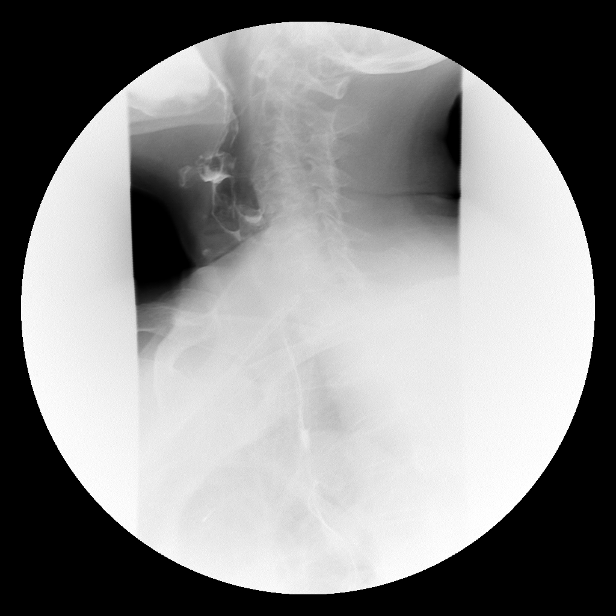
[im 6/11]
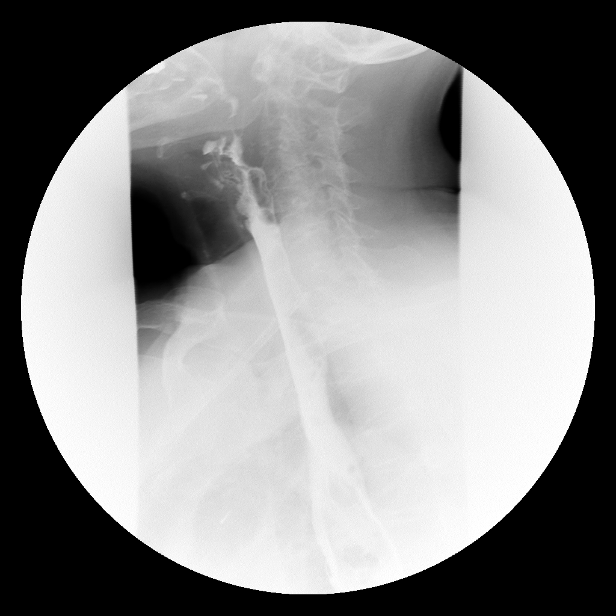
[im 9/11]
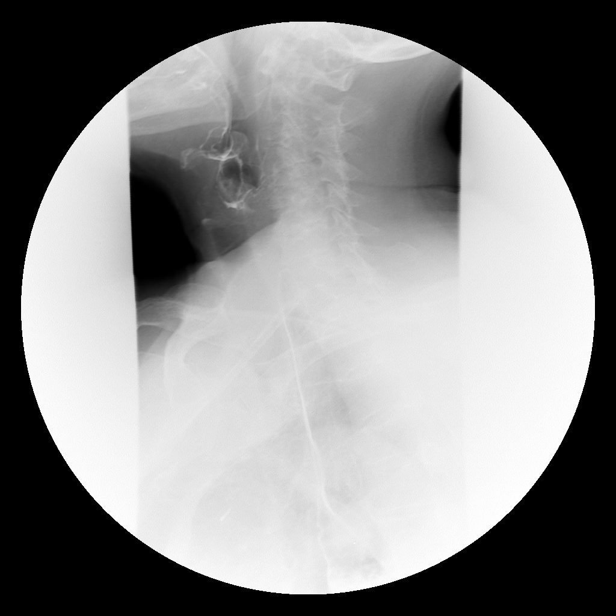

[Series 9: run · 1 of 1 slices shown (6 of 9)]
[im 1/1]
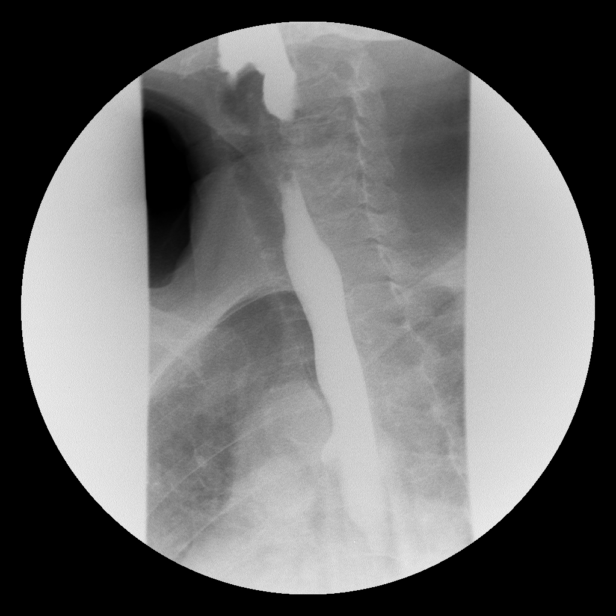

[Series 10: run · 1 of 1 slices shown (7 of 9)]
[im 1/1]
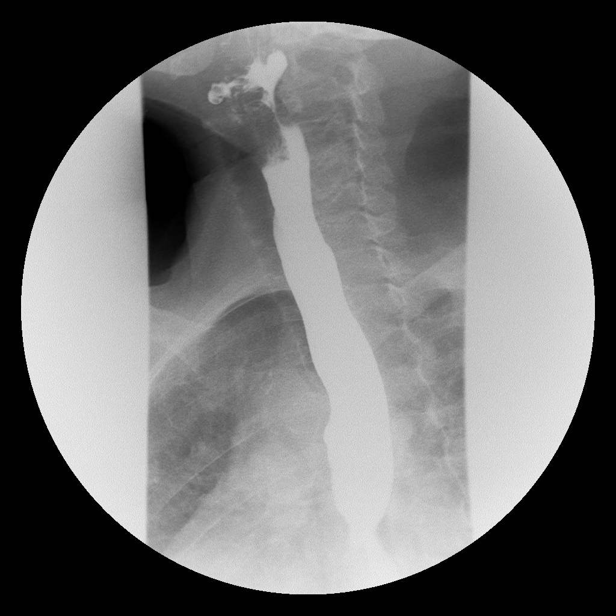

[Series 12: run · 1 of 1 slices shown (8 of 9)]
[im 1/1]
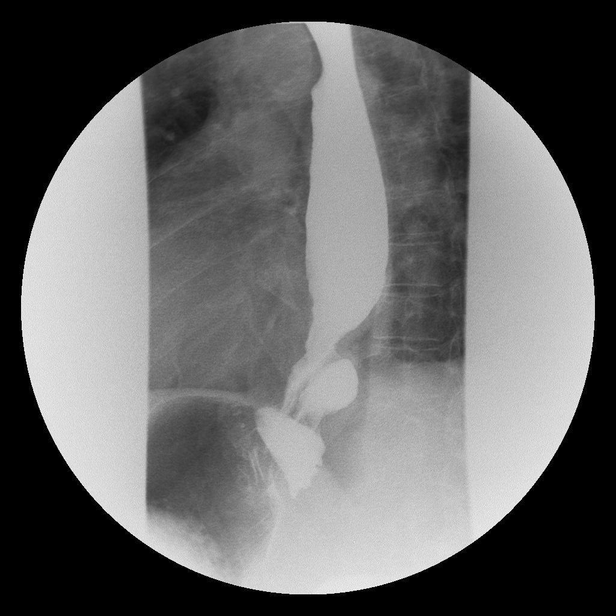

[Series 14: run · 1 of 1 slices shown (9 of 9)]
[im 1/1]
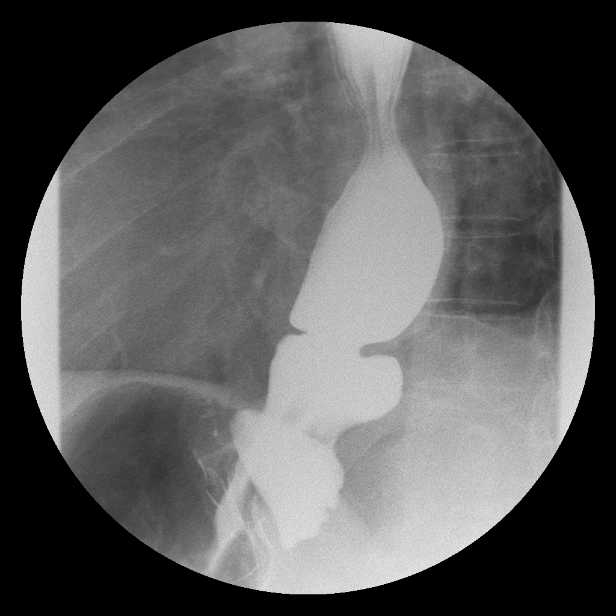

[14 of 24 positions shown; findings below may reference images not displayed]

FINDINGS: Initially a double contrast study was performed and the
mucosa of the esophagus is unremarkable.

A single contrast study was then performed.  The swallowing
mechanism appears normal.  There is a slightly prominent
cricopharyngeus muscle noted.  The primary esophageal peristaltic
wave is diminished. There is a small hiatal hernia present.  No
definite reflux could be demonstrated.  A barium pill was given at
the end of the study which did pass into the stomach without delay
IMPRESSION: 1.  Small hiatal hernia.  No definite reflux could be demonstrated.
2.  Diminished primary peristaltic wave.
3.  Slightly prominent cricopharyngeus muscle.

## 2013-12-06 ENCOUNTER — Telehealth: Payer: Self-pay

## 2013-12-06 MED ORDER — OLANZAPINE 2.5 MG PO TABS: 2.5000 mg | ORAL_TABLET | Freq: Every day | ORAL | Status: DC

## 2013-12-06 MED ORDER — FUROSEMIDE 20 MG PO TABS: 20.0000 mg | ORAL_TABLET | Freq: Every day | ORAL | Status: DC

## 2013-12-06 NOTE — Telephone Encounter (Signed)
Medications added to list and refilled as requested.  Called the family to inform refills done.

## 2013-12-06 NOTE — Telephone Encounter (Signed)
Patient just released from rehab and needs refill on medications started at rehab.  Please advise on filling Olanzapine 2.5 mg one at night and furosemide 20 mg one daily.  Advise is ok to add to list and send to [id number].  Patient has f/u appt. 12/12/13.

## 2013-12-06 NOTE — Telephone Encounter (Signed)
Ok to add to list, and routine refills

## 2013-12-10 ENCOUNTER — Encounter (HOSPITAL_COMMUNITY): Payer: Self-pay | Admitting: Emergency Medicine

## 2013-12-10 ENCOUNTER — Emergency Department (INDEPENDENT_AMBULATORY_CARE_PROVIDER_SITE_OTHER)
Admission: EM | Admit: 2013-12-10 | Discharge: 2013-12-10 | Disposition: A | Discharge status: Nursing Home (Includes ICF) | Payer: Medicare Other | Source: Home / Self Care

## 2013-12-10 ENCOUNTER — Inpatient Hospital Stay (HOSPITAL_COMMUNITY)
Admission: EM | Admit: 2013-12-10 | Discharge: 2013-12-14 | DRG: 100 | Disposition: A | Discharge status: Skilled Nursing Facility | Payer: Medicare Other | Attending: Internal Medicine | Admitting: Internal Medicine

## 2013-12-10 ENCOUNTER — Emergency Department (HOSPITAL_COMMUNITY): Payer: Medicare Other

## 2013-12-10 DIAGNOSIS — K5730 Diverticulosis of large intestine without perforation or abscess without bleeding: Secondary | ICD-10-CM

## 2013-12-10 DIAGNOSIS — N179 Acute kidney failure, unspecified: Secondary | ICD-10-CM

## 2013-12-10 DIAGNOSIS — R269 Unspecified abnormalities of gait and mobility: Secondary | ICD-10-CM

## 2013-12-10 DIAGNOSIS — F3289 Other specified depressive episodes: Secondary | ICD-10-CM

## 2013-12-10 DIAGNOSIS — T43505A Adverse effect of unspecified antipsychotics and neuroleptics, initial encounter: Secondary | ICD-10-CM | POA: Diagnosis present

## 2013-12-10 DIAGNOSIS — R0609 Other forms of dyspnea: Secondary | ICD-10-CM

## 2013-12-10 DIAGNOSIS — I351 Nonrheumatic aortic (valve) insufficiency: Secondary | ICD-10-CM

## 2013-12-10 DIAGNOSIS — M549 Dorsalgia, unspecified: Secondary | ICD-10-CM

## 2013-12-10 DIAGNOSIS — K222 Esophageal obstruction: Secondary | ICD-10-CM

## 2013-12-10 DIAGNOSIS — F22 Delusional disorders: Secondary | ICD-10-CM

## 2013-12-10 DIAGNOSIS — R5381 Other malaise: Secondary | ICD-10-CM

## 2013-12-10 DIAGNOSIS — F329 Major depressive disorder, single episode, unspecified: Secondary | ICD-10-CM

## 2013-12-10 DIAGNOSIS — E1142 Type 2 diabetes mellitus with diabetic polyneuropathy: Secondary | ICD-10-CM | POA: Diagnosis present

## 2013-12-10 DIAGNOSIS — Z860100 Personal history of colon polyps, unspecified: Secondary | ICD-10-CM

## 2013-12-10 DIAGNOSIS — K449 Diaphragmatic hernia without obstruction or gangrene: Secondary | ICD-10-CM

## 2013-12-10 DIAGNOSIS — M810 Age-related osteoporosis without current pathological fracture: Secondary | ICD-10-CM

## 2013-12-10 DIAGNOSIS — H5316 Psychophysical visual disturbances: Secondary | ICD-10-CM | POA: Diagnosis not present

## 2013-12-10 DIAGNOSIS — Z0000 Encounter for general adult medical examination without abnormal findings: Secondary | ICD-10-CM

## 2013-12-10 DIAGNOSIS — Y92009 Unspecified place in unspecified non-institutional (private) residence as the place of occurrence of the external cause: Secondary | ICD-10-CM

## 2013-12-10 DIAGNOSIS — E1140 Type 2 diabetes mellitus with diabetic neuropathy, unspecified: Secondary | ICD-10-CM | POA: Diagnosis present

## 2013-12-10 DIAGNOSIS — R634 Abnormal weight loss: Secondary | ICD-10-CM

## 2013-12-10 DIAGNOSIS — R569 Unspecified convulsions: Secondary | ICD-10-CM

## 2013-12-10 DIAGNOSIS — R531 Weakness: Secondary | ICD-10-CM

## 2013-12-10 DIAGNOSIS — F411 Generalized anxiety disorder: Secondary | ICD-10-CM

## 2013-12-10 DIAGNOSIS — I10 Essential (primary) hypertension: Secondary | ICD-10-CM

## 2013-12-10 DIAGNOSIS — E43 Unspecified severe protein-calorie malnutrition: Secondary | ICD-10-CM

## 2013-12-10 DIAGNOSIS — R259 Unspecified abnormal involuntary movements: Secondary | ICD-10-CM

## 2013-12-10 DIAGNOSIS — IMO0002 Reserved for concepts with insufficient information to code with codable children: Secondary | ICD-10-CM

## 2013-12-10 DIAGNOSIS — Z794 Long term (current) use of insulin: Secondary | ICD-10-CM

## 2013-12-10 DIAGNOSIS — D509 Iron deficiency anemia, unspecified: Secondary | ICD-10-CM

## 2013-12-10 DIAGNOSIS — N390 Urinary tract infection, site not specified: Secondary | ICD-10-CM

## 2013-12-10 DIAGNOSIS — R4182 Altered mental status, unspecified: Secondary | ICD-10-CM

## 2013-12-10 DIAGNOSIS — R5383 Other fatigue: Secondary | ICD-10-CM

## 2013-12-10 DIAGNOSIS — R7989 Other specified abnormal findings of blood chemistry: Secondary | ICD-10-CM

## 2013-12-10 DIAGNOSIS — J309 Allergic rhinitis, unspecified: Secondary | ICD-10-CM

## 2013-12-10 DIAGNOSIS — E785 Hyperlipidemia, unspecified: Secondary | ICD-10-CM

## 2013-12-10 DIAGNOSIS — I509 Heart failure, unspecified: Secondary | ICD-10-CM

## 2013-12-10 DIAGNOSIS — K219 Gastro-esophageal reflux disease without esophagitis: Secondary | ICD-10-CM

## 2013-12-10 DIAGNOSIS — K5900 Constipation, unspecified: Secondary | ICD-10-CM

## 2013-12-10 DIAGNOSIS — G459 Transient cerebral ischemic attack, unspecified: Secondary | ICD-10-CM

## 2013-12-10 DIAGNOSIS — E1149 Type 2 diabetes mellitus with other diabetic neurological complication: Secondary | ICD-10-CM

## 2013-12-10 DIAGNOSIS — R1319 Other dysphagia: Secondary | ICD-10-CM

## 2013-12-10 DIAGNOSIS — R197 Diarrhea, unspecified: Secondary | ICD-10-CM

## 2013-12-10 DIAGNOSIS — Z800 Family history of malignant neoplasm of digestive organs: Secondary | ICD-10-CM

## 2013-12-10 DIAGNOSIS — J189 Pneumonia, unspecified organism: Secondary | ICD-10-CM

## 2013-12-10 DIAGNOSIS — Z66 Do not resuscitate: Secondary | ICD-10-CM | POA: Diagnosis present

## 2013-12-10 DIAGNOSIS — I5031 Acute diastolic (congestive) heart failure: Secondary | ICD-10-CM

## 2013-12-10 DIAGNOSIS — E860 Dehydration: Secondary | ICD-10-CM

## 2013-12-10 DIAGNOSIS — J383 Other diseases of vocal cords: Secondary | ICD-10-CM

## 2013-12-10 DIAGNOSIS — R110 Nausea: Secondary | ICD-10-CM

## 2013-12-10 DIAGNOSIS — E1165 Type 2 diabetes mellitus with hyperglycemia: Secondary | ICD-10-CM

## 2013-12-10 DIAGNOSIS — G40209 Localization-related (focal) (partial) symptomatic epilepsy and epileptic syndromes with complex partial seizures, not intractable, without status epilepticus: Principal | ICD-10-CM

## 2013-12-10 DIAGNOSIS — R490 Dysphonia: Secondary | ICD-10-CM

## 2013-12-10 DIAGNOSIS — E669 Obesity, unspecified: Secondary | ICD-10-CM

## 2013-12-10 DIAGNOSIS — F0390 Unspecified dementia without behavioral disturbance: Secondary | ICD-10-CM

## 2013-12-10 DIAGNOSIS — E7800 Pure hypercholesterolemia, unspecified: Secondary | ICD-10-CM | POA: Diagnosis present

## 2013-12-10 DIAGNOSIS — R350 Frequency of micturition: Secondary | ICD-10-CM

## 2013-12-10 DIAGNOSIS — Z8673 Personal history of transient ischemic attack (TIA), and cerebral infarction without residual deficits: Secondary | ICD-10-CM

## 2013-12-10 LAB — I-STAT TROPONIN, ED: TROPONIN I, POC: 0.01 ng/mL (ref 0.00–0.08)

## 2013-12-10 LAB — DIFFERENTIAL
Basophils Absolute: 0 10*3/uL (ref 0.0–0.1)
Basophils Relative: 0 % (ref 0–1)
EOS ABS: 0.2 10*3/uL (ref 0.0–0.7)
EOS PCT: 2 % (ref 0–5)
LYMPHS ABS: 2.3 10*3/uL (ref 0.7–4.0)
LYMPHS PCT: 29 % (ref 12–46)
MONO ABS: 0.6 10*3/uL (ref 0.1–1.0)
MONOS PCT: 8 % (ref 3–12)
Neutro Abs: 4.9 10*3/uL (ref 1.7–7.7)
Neutrophils Relative %: 61 % (ref 43–77)

## 2013-12-10 LAB — COMPREHENSIVE METABOLIC PANEL
ALT: 12 U/L (ref 0–35)
AST: 17 U/L (ref 0–37)
Albumin: 3.6 g/dL (ref 3.5–5.2)
Alkaline Phosphatase: 55 U/L (ref 39–117)
BUN: 18 mg/dL (ref 6–23)
CALCIUM: 9.6 mg/dL (ref 8.4–10.5)
CO2: 20 meq/L (ref 19–32)
CREATININE: 1.2 mg/dL — AB (ref 0.50–1.10)
Chloride: 97 mEq/L (ref 96–112)
GFR calc Af Amer: 48 mL/min — ABNORMAL LOW (ref >90)
GFR, EST NON AFRICAN AMERICAN: 42 mL/min — AB (ref >90)
GLUCOSE: 110 mg/dL — AB (ref 70–99)
Potassium: 4.2 mEq/L (ref 3.7–5.3)
Sodium: 140 mEq/L (ref 137–147)
TOTAL PROTEIN: 7.7 g/dL (ref 6.0–8.3)
Total Bilirubin: 0.3 mg/dL (ref 0.3–1.2)

## 2013-12-10 LAB — CBC
HEMATOCRIT: 28.6 % — AB (ref 36.0–46.0)
Hemoglobin: 9.6 g/dL — ABNORMAL LOW (ref 12.0–15.0)
MCH: 29.5 pg (ref 26.0–34.0)
MCHC: 33.6 g/dL (ref 30.0–36.0)
MCV: 88 fL (ref 78.0–100.0)
Platelets: 291 10*3/uL (ref 150–400)
RBC: 3.25 MIL/uL — AB (ref 3.87–5.11)
RDW: 14.4 % (ref 11.5–15.5)
WBC: 8.1 10*3/uL (ref 4.0–10.5)

## 2013-12-10 LAB — I-STAT CHEM 8, ED
BUN: 17 mg/dL (ref 6–23)
CALCIUM ION: 1.07 mmol/L — AB (ref 1.13–1.30)
Chloride: 103 mEq/L (ref 96–112)
Creatinine, Ser: 1.3 mg/dL — ABNORMAL HIGH (ref 0.50–1.10)
GLUCOSE: 110 mg/dL — AB (ref 70–99)
HCT: 31 % — ABNORMAL LOW (ref 36.0–46.0)
Hemoglobin: 10.5 g/dL — ABNORMAL LOW (ref 12.0–15.0)
Potassium: 3.7 mEq/L (ref 3.7–5.3)
Sodium: 140 mEq/L (ref 137–147)
TCO2: 22 mmol/L (ref 0–100)

## 2013-12-10 LAB — GLUCOSE, CAPILLARY
GLUCOSE-CAPILLARY: 109 mg/dL — AB (ref 70–99)
Glucose-Capillary: 151 mg/dL — ABNORMAL HIGH (ref 70–99)

## 2013-12-10 LAB — PROTIME-INR
INR: 1 (ref 0.00–1.49)
Prothrombin Time: 13 seconds (ref 11.6–15.2)

## 2013-12-10 LAB — APTT: aPTT: 30 seconds (ref 24–37)

## 2013-12-10 MED ORDER — LEVETIRACETAM 500 MG/5ML IV SOLN
500.0000 mg | Freq: Once | INTRAVENOUS | Status: AC
  Administered 2013-12-10: 500 mg via INTRAVENOUS
  Filled 2013-12-10: qty 5

## 2013-12-10 MED ORDER — TAMSULOSIN HCL 0.4 MG PO CAPS
0.4000 mg | ORAL_CAPSULE | Freq: Every morning | ORAL | Status: DC
  Administered 2013-12-11 – 2013-12-14 (×4): 0.4 mg via ORAL
  Filled 2013-12-10 (×4): qty 1

## 2013-12-10 MED ORDER — ATORVASTATIN CALCIUM 10 MG PO TABS
10.0000 mg | ORAL_TABLET | Freq: Every morning | ORAL | Status: DC
  Administered 2013-12-11 – 2013-12-14 (×4): 10 mg via ORAL
  Filled 2013-12-10 (×4): qty 1

## 2013-12-10 MED ORDER — PANTOPRAZOLE SODIUM 40 MG PO TBEC
80.0000 mg | DELAYED_RELEASE_TABLET | Freq: Every day | ORAL | Status: DC
  Administered 2013-12-11 – 2013-12-14 (×4): 80 mg via ORAL
  Filled 2013-12-10 (×3): qty 2

## 2013-12-10 MED ORDER — AMLODIPINE BESYLATE 10 MG PO TABS
10.0000 mg | ORAL_TABLET | Freq: Every morning | ORAL | Status: DC
  Administered 2013-12-11 – 2013-12-14 (×4): 10 mg via ORAL
  Filled 2013-12-10 (×4): qty 1

## 2013-12-10 MED ORDER — DOCUSATE SODIUM 100 MG PO CAPS
100.0000 mg | ORAL_CAPSULE | Freq: Two times a day (BID) | ORAL | Status: DC
  Administered 2013-12-10 – 2013-12-14 (×8): 100 mg via ORAL
  Filled 2013-12-10 (×7): qty 1

## 2013-12-10 MED ORDER — OLANZAPINE 2.5 MG PO TABS
2.5000 mg | ORAL_TABLET | Freq: Every day | ORAL | Status: DC
  Administered 2013-12-10 – 2013-12-13 (×4): 2.5 mg via ORAL
  Filled 2013-12-10 (×5): qty 1

## 2013-12-10 MED ORDER — LORATADINE 10 MG PO TABS
10.0000 mg | ORAL_TABLET | Freq: Every day | ORAL | Status: DC
  Administered 2013-12-11 – 2013-12-14 (×4): 10 mg via ORAL
  Filled 2013-12-10 (×4): qty 1

## 2013-12-10 MED ORDER — FERROUS SULFATE 325 (65 FE) MG PO TABS
325.0000 mg | ORAL_TABLET | Freq: Three times a day (TID) | ORAL | Status: DC
  Administered 2013-12-11 – 2013-12-14 (×11): 325 mg via ORAL
  Filled 2013-12-10 (×13): qty 1

## 2013-12-10 MED ORDER — HEPARIN SODIUM (PORCINE) 5000 UNIT/ML IJ SOLN
5000.0000 [IU] | Freq: Three times a day (TID) | INTRAMUSCULAR | Status: DC
  Administered 2013-12-10 – 2013-12-14 (×12): 5000 [IU] via SUBCUTANEOUS
  Filled 2013-12-10 (×14): qty 1

## 2013-12-10 MED ORDER — MEMANTINE HCL ER 28 MG PO CP24
1.0000 | ORAL_CAPSULE | Freq: Every morning | ORAL | Status: DC
  Administered 2013-12-11 – 2013-12-14 (×4): 28 mg via ORAL
  Filled 2013-12-10 (×4): qty 28

## 2013-12-10 MED ORDER — INSULIN ASPART 100 UNIT/ML ~~LOC~~ SOLN
0.0000 [IU] | Freq: Three times a day (TID) | SUBCUTANEOUS | Status: DC
Start: 2013-12-11 — End: 2013-12-14
  Administered 2013-12-11: 2 [IU] via SUBCUTANEOUS
  Administered 2013-12-11: 5 [IU] via SUBCUTANEOUS
  Administered 2013-12-12: 3 [IU] via SUBCUTANEOUS
  Administered 2013-12-12: 2 [IU] via SUBCUTANEOUS
  Administered 2013-12-13: 9 [IU] via SUBCUTANEOUS
  Administered 2013-12-13: 1 [IU] via SUBCUTANEOUS
  Administered 2013-12-14: 2 [IU] via SUBCUTANEOUS
  Administered 2013-12-14: 3 [IU] via SUBCUTANEOUS

## 2013-12-10 MED ORDER — INSULIN ASPART 100 UNIT/ML ~~LOC~~ SOLN: 0.0000 [IU] | Freq: Every day | SUBCUTANEOUS | Status: DC

## 2013-12-10 MED ORDER — CLONAZEPAM 0.5 MG PO TABS
0.2500 mg | ORAL_TABLET | Freq: Two times a day (BID) | ORAL | Status: DC | PRN
  Administered 2013-12-12: 10:00:00 via ORAL
  Administered 2013-12-14: 0.25 mg via ORAL
  Filled 2013-12-10 (×2): qty 1

## 2013-12-10 MED ORDER — CLOPIDOGREL BISULFATE 75 MG PO TABS
75.0000 mg | ORAL_TABLET | Freq: Every day | ORAL | Status: DC
  Administered 2013-12-11 – 2013-12-14 (×4): 75 mg via ORAL
  Filled 2013-12-10 (×7): qty 1

## 2013-12-10 MED ORDER — FUROSEMIDE 20 MG PO TABS
20.0000 mg | ORAL_TABLET | Freq: Every day | ORAL | Status: DC
  Administered 2013-12-11 – 2013-12-14 (×4): 20 mg via ORAL
  Filled 2013-12-10 (×4): qty 1

## 2013-12-10 MED ORDER — LEVETIRACETAM 500 MG PO TABS
500.0000 mg | ORAL_TABLET | Freq: Two times a day (BID) | ORAL | Status: DC
  Administered 2013-12-11 – 2013-12-14 (×7): 500 mg via ORAL
  Filled 2013-12-10 (×9): qty 1

## 2013-12-10 MED ORDER — MELATONIN 1 MG SL SUBL
1.0000 mg | SUBLINGUAL_TABLET | Freq: Every evening | SUBLINGUAL | Status: DC | PRN
Start: 2013-12-10 — End: 2013-12-10

## 2013-12-10 NOTE — Significant Event (Signed)
Rapid Response Event Note  Overview:  called for Code stroke.    Initial Focused Assessment: Assisted with pateint transport and CT scan.   Interventions:   Event Summary:   at      at          Baron Hamper

## 2013-12-10 NOTE — ED Notes (Signed)
Pt presents from [facility] via [facility] with c/o Code strokes. Pt presented to [facility] with c/o worste headache of her life and tremoring in bilateral hands and head and dizziness. Pt also has mild right sided facial droop. Family at bedside, pt alert and interactive.

## 2013-12-10 NOTE — ED Provider Notes (Signed)
CSN: [id number]     Arrival date & time 12/10/13  1719 History   First MD Initiated Contact with Patient 12/10/13 1720     Chief Complaint  Patient presents with  . Code Stroke   HPI 80 year old female presents from urgent care as a code stroke.  Patient is unable to provide full history secondary to dementia.  History is obtained from family and from urgent care provider. She was reportedly in her usual state of health, about 3:30 she developed a severe, sudden onset headache. Her speech became abnormal and she had some abnormal staring. She had some shaking of her bilateral upper extremities and some flexion of her lower shoulders. Her blood pressure was greater than 200 home. She was taken to urgent care for evaluation. There was concern for possible stroke and for possible seizures and therefore she was transferred here, culture back today.  Arrival to the emergency department patient is awake and alert and airway is intact she is protecting her airway and in no distress though complaining of severe headache. She proceed immediately to CT scan her after brief evaluation for stability.   Past Medical History  Diagnosis Date  . COLONIC POLYPS 06/15/2005  . DIABETES MELLITUS, TYPE II 04/22/2007  . HYPERCHOLESTEROLEMIA   . HYPERLIPIDEMIA   . OBESITY   . ANEMIA-IRON DEFICIENCY   . DEPRESSION   . HYPERTENSION   . ALLERGIC RHINITIS   . ESOPHAGEAL STRICTURE   . GERD   . HIATAL HERNIA   . DIVERTICULOSIS, COLON   . BACK PAIN   . OSTEOPOROSIS   . DIZZINESS   . WEIGHT LOSS   . DYSPNEA   . Other dysphagia   . COLONIC POLYPS, HX OF 04/03/2002 & 06/15/2005    TUBULAR ADENOMA  . History of CVA (cerebrovascular accident) 07/29/2012    Noted old, to right thalamus and pons by Head CT Jul 27, 2012  . Dementia 09/17/2012  . Type II or unspecified type diabetes mellitus without mention of complication, uncontrolled 07/16/2012   Past Surgical History  Procedure Laterality Date  . Appendectomy     . Abdominal hysterectomy    . Tonsillectomy and adenoidectomy    . Left ankle surgury    . S/p bladder pubovaginal sling    . S/p cystocele/rectocele repair    . Laparoscopic takedown of incarcerated stomach within the chest/nissen  2009  . Esophageal manometry  12/21/2011    Procedure: ESOPHAGEAL MANOMETRY (EM);  Surgeon: Sable Feil, MD;  Location: [facility];  Service: Endoscopy;  Laterality: N/A;   Family History  Problem Relation Age of Onset  . Heart disease Mother 54  . Diabetes Mother   . Heart attack Mother   . Colon cancer Father   . Diabetes Father    History  Substance Use Topics  . Smoking status: Never Smoker   . Smokeless tobacco: Never Used  . Alcohol Use: No   OB History   Grav Para Term Preterm Abortions TAB SAB Ect Mult Living                 Review of Systems  Unable to perform ROS: Acuity of condition  and dementia.   Allergies  Other; Codeine; Hydrocodone-acetaminophen; Keflex; and Oxycodone-acetaminophen  Home Medications   No current outpatient prescriptions on file. BP 144/54  Pulse 80  Temp(Src) 97.7 F (36.5 C) (Tympanic)  Resp 18  Ht 5\' 3"  (1.6 m)  Wt 148 lb 11.2 oz (67.45 kg)  BMI 26.35 kg/m2  SpO2 98% Physical Exam  Nursing note and vitals reviewed. Constitutional: She appears well-developed and well-nourished.  Appears uncomfortable, complaining of headache, gross tremor-like movements of bilateral upper extremities, L facial droop, abnormal speech  HENT:  Head: Normocephalic and atraumatic.  Mouth/Throat: Oropharynx is clear and moist.  Eyes: Conjunctivae and EOM are normal. Pupils are equal, round, and reactive to light. No scleral icterus.  Neck: Normal range of motion. Neck supple.  Cardiovascular: Normal rate, regular rhythm and normal heart sounds.   Pulmonary/Chest: Effort normal and breath sounds normal. No respiratory distress. She has no wheezes. She has no rales.  Abdominal: Soft. She exhibits no distension.  There is no tenderness.  Musculoskeletal: She exhibits no edema.  Neurological:  Alert, following commands, moves all extremities equally. She has gross tremor leg movements of her bilateral upper extremities. She has a left-sided facial droop. She has abnormal speech. No generalized seizure activity.  Skin: Skin is warm and dry.    ED Course  Procedures (including critical care time) Labs Review Labs Reviewed  CBC - Abnormal; Notable for the following:    RBC 3.25 (*)    Hemoglobin 9.6 (*)    HCT 28.6 (*)    All other components within normal limits  COMPREHENSIVE METABOLIC PANEL - Abnormal; Notable for the following:    Glucose, Bld 110 (*)    Creatinine, Ser 1.20 (*)    GFR calc non Af Amer 42 (*)    GFR calc Af Amer 48 (*)    All other components within normal limits  GLUCOSE, CAPILLARY - Abnormal; Notable for the following:    Glucose-Capillary 151 (*)    All other components within normal limits  I-STAT CHEM 8, ED - Abnormal; Notable for the following:    Creatinine, Ser 1.30 (*)    Glucose, Bld 110 (*)    Calcium, Ion 1.07 (*)    Hemoglobin 10.5 (*)    HCT 31.0 (*)    All other components within normal limits  PROTIME-INR  APTT  DIFFERENTIAL  COMPREHENSIVE METABOLIC PANEL  CBG MONITORING, ED  I-STAT TROPOININ, ED   Imaging Review Ct Head (brain) Wo Contrast  12/10/2013   CLINICAL DATA:  Tremors  EXAM: CT HEAD WITHOUT CONTRAST  TECHNIQUE: Contiguous axial images were obtained from the base of the skull through the vertex without intravenous contrast. Study was obtained within 24 hr of patient's arrival at the emergency department.  COMPARISON:  October 05, 2013 and May 01, 2009  FINDINGS: There is moderate diffuse atrophy. There is no appreciable mass, hemorrhage, extra-axial fluid collection, or midline shift. There is evidence of a prior infarct in the mid left frontal lobe. There is evidence of a prior small infarct in the right thalamus. There is a prior small  infarct in the left pons. There is patchy small vessel disease in the centra semiovale bilaterally. There is no new gray-white compartment lesion. No acute infarct is appreciable.  An area of relative lucency in the left frontal bone is stable compared to 2010. Bony calvarium appears stable. Mastoid air cells are clear. There are areas of mild mucosal thickening in both maxillary antra.  IMPRESSION: Atrophy with small vessel disease. Prior small infarcts. No acute appearing infarct. No hemorrhage or mass effect. Mild maxillary sinus disease bilaterally.  Critical Value/emergent results were called by telephone at the time of interpretation on 12/10/2013 at 5:40 PM to Dr. Nicole Kindred, neruology , who verbally acknowledged these results.   Electronically Signed   By: Gwyndolyn Saxon  Jasmine December M.D.   On: 12/10/2013 17:41     EKG Interpretation   Date/Time:  Sunday December 10 2013 17:38:43 EDT Ventricular Rate:  90 PR Interval:    QRS Duration: 87 QT Interval:  439 QTC Calculation: 537 R Axis:   4 Text Interpretation:  Normal sinus rhythm Anterior infarct, old Borderline  repolarization abnormality Prolonged QT interval Confirmed by Kenna Gilbert,  KRISTEN [phone]) on 12/10/2013 5:50:15 PM      MDM   80 year old female arrived as a transfer from urgent care, code stroke, symptoms started at 3:30 PM, severe sudden headache. Bilateral upper extremity abnormal movements. Left facial droop.  On arrival hypertensive, otherwise normal vitals. Awake alert and following commands. Left facial droop noted, abnormal speech noted. Patient protecting airway and cleared to proceed to CT scanner for emergent to stroke head CT.  Neurology present within minutes the patient's arrival and assumed care. They feel the patient is having a partial seizure. They admit her to the neurology service. She was given Keppra load in the ED. Her head CT was negative for acute intracranial abnormality.    Final diagnoses:  Complex partial  seizures  Dementia  Type 2 diabetes, uncontrolled, with neuropathy       Wendall Papa, MD 12/11/13 0028

## 2013-12-10 NOTE — ED Notes (Signed)
Pt  Reports    Became  Confused   With random   Jerking  Ansd  Confusion  About  1  Hr  Ago       Arrived  With  Son  And  Daughter in law   Developed    Headache  Vision  Loss           Has     Distant  starte  Pt  Taking  Anti  Coag                        Code  Stroke  Called             Family  At  bedisde    Glucose   109  Pt  Has   Some  Tremors    Although  Her  Eyes  Are  open Her  Eyes  Are  Open

## 2013-12-10 NOTE — ED Provider Notes (Signed)
Medical screening examination/treatment/procedure(s) were performed by non-physician practitioner and as supervising physician I was immediately available for consultation/collaboration.  Philipp Deputy, M.D.  Harden Mo, MD 12/10/13 [phone]

## 2013-12-10 NOTE — ED Provider Notes (Signed)
CSN: [id number]     Arrival date & time 12/10/13  1643 History   None    Chief Complaint  Patient presents with  . Altered Mental Status   (Consider location/radiation/quality/duration/timing/severity/associated sxs/prior Treatment) HPI Comments: Family reports that patient was in her usual state of health until 3:30pm this afternoon when she developed sudden onset of right occipital headache, became unable to respond verbally, developed a distant staring gaze, and began with tonic-clonic shaking of bilateral upper extremities and fixed extension of bilateral lower extremities. Daughter-in-law reports at that time she took patient's blood pressure and found systolic pressure to be >035. Daughter-in-law and son stated that patient appeared very anxious, so the attempted to treat her with a dose of medication prescribed to patient for anxiety (clonazepam) and when this did not seem to help with patient's symptoms, family transported patient to [facility] for evaluation.    Patient is a 80 y.o. female presenting with altered mental status. The history is provided by a relative.  Altered Mental Status Presenting symptoms: confusion, disorientation and partial responsiveness     Past Medical History  Diagnosis Date  . COLONIC POLYPS 06/15/2005  . DIABETES MELLITUS, TYPE II 04/22/2007  . HYPERCHOLESTEROLEMIA   . HYPERLIPIDEMIA   . OBESITY   . ANEMIA-IRON DEFICIENCY   . DEPRESSION   . HYPERTENSION   . ALLERGIC RHINITIS   . ESOPHAGEAL STRICTURE   . GERD   . HIATAL HERNIA   . DIVERTICULOSIS, COLON   . BACK PAIN   . OSTEOPOROSIS   . DIZZINESS   . WEIGHT LOSS   . DYSPNEA   . Other dysphagia   . COLONIC POLYPS, HX OF 04/03/2002 & 06/15/2005    TUBULAR ADENOMA  . History of CVA (cerebrovascular accident) 07/29/2012    Noted old, to right thalamus and pons by Head CT Jul 27, 2012  . Dementia 09/17/2012  . Type II or unspecified type diabetes mellitus without mention of complication, uncontrolled  07/16/2012   Past Surgical History  Procedure Laterality Date  . Appendectomy    . Abdominal hysterectomy    . Tonsillectomy and adenoidectomy    . Left ankle surgury    . S/p bladder pubovaginal sling    . S/p cystocele/rectocele repair    . Laparoscopic takedown of incarcerated stomach within the chest/nissen  2009  . Esophageal manometry  12/21/2011    Procedure: ESOPHAGEAL MANOMETRY (EM);  Surgeon: Sable Feil, MD;  Location: [facility];  Service: Endoscopy;  Laterality: N/A;   Family History  Problem Relation Age of Onset  . Heart disease Mother 24  . Diabetes Mother   . Heart attack Mother   . Colon cancer Father   . Diabetes Father    History  Substance Use Topics  . Smoking status: Never Smoker   . Smokeless tobacco: Never Used  . Alcohol Use: No   OB History   Grav Para Term Preterm Abortions TAB SAB Ect Mult Living                 Review of Systems  Unable to perform ROS: Mental status change  Psychiatric/Behavioral: Positive for confusion.    Allergies  Keflex; Codeine; Hydrocodone-acetaminophen; and Oxycodone-acetaminophen  Home Medications   Current Outpatient Rx  Name  Route  Sig  Dispense  Refill  . amLODipine (NORVASC) 10 MG tablet   Oral   Take 10 mg by mouth every morning.         Marland Kitchen atorvastatin (LIPITOR) 10 MG tablet  Oral   Take 10 mg by mouth every morning.         . cetirizine (ZYRTEC) 10 MG tablet   Oral   Take 10 mg by mouth every morning.          . clonazePAM (KLONOPIN) 0.25 MG disintegrating tablet   Oral   Take 0.25 mg by mouth 2 (two) times daily as needed (anxiety).         . clopidogrel (PLAVIX) 75 MG tablet   Oral   Take 75 mg by mouth daily with breakfast.         . docusate sodium 100 MG CAPS   Oral   Take 100 mg by mouth 2 (two) times daily.   10 capsule   0   . esomeprazole (NEXIUM 24HR) 20 MG capsule   Oral   Take 1 capsule (20 mg total) by mouth daily before breakfast.   30 capsule   2    . feeding supplement, GLUCERNA SHAKE, (GLUCERNA SHAKE) LIQD   Oral   Take 237 mLs by mouth 2 (two) times daily between meals.   60 Can   0   . ferrous sulfate 325 (65 FE) MG tablet   Oral   Take 1 tablet (325 mg total) by mouth 3 (three) times daily after meals.   90 tablet   0   . furosemide (LASIX) 20 MG tablet   Oral   Take 1 tablet (20 mg total) by mouth daily.   30 tablet      . furosemide (LASIX) 20 MG tablet   Oral   Take 1 tablet (20 mg total) by mouth daily.   30 tablet   11   . insulin glargine (LANTUS) 100 UNIT/ML injection   Subcutaneous   Inject 10 Units into the skin 2 (two) times daily.         . meclizine (ANTIVERT) 12.5 MG tablet   Oral   Take 1 tablet (12.5 mg total) by mouth 3 (three) times daily.   30 tablet   0     For vertigo, hold for sedation   . megestrol (MEGACE) 400 MG/10ML suspension   Oral   Take 10 mLs (400 mg total) by mouth daily.   480 mL   0   . MELATONIN PO   Oral   Take 1 tablet by mouth at bedtime as needed (sleep).         . Memantine HCl ER (NAMENDA XR) 28 MG CP24   Oral   Take 1 capsule by mouth every morning.         . metFORMIN (GLUCOPHAGE) 500 MG tablet   Oral   Take 1 tablet (500 mg total) by mouth 2 (two) times daily with a meal.   180 tablet   3   . OLANZapine (ZYPREXA) 2.5 MG tablet   Oral   Take 1 tablet (2.5 mg total) by mouth at bedtime.   30 tablet   11   . tamsulosin (FLOMAX) 0.4 MG CAPS capsule   Oral   Take 0.4 mg by mouth every morning.          BP 166/93  Pulse 98  Temp(Src) 97.5 F (36.4 C) (Oral)  Resp 20  SpO2 100% Physical Exam  Nursing note and vitals reviewed. Constitutional: She appears well-developed and well-nourished. She appears distressed.  HENT:  Head: Normocephalic and atraumatic.  Eyes: Conjunctivae are normal. Pupils are equal, round, and reactive to light.  Cardiovascular: Normal rate.  Pulmonary/Chest: No respiratory distress. She has decreased breath  sounds in the right lower field and the left lower field. She has no wheezes.  Neurological: Coordination abnormal. GCS eye subscore is 4. GCS verbal subscore is 4. GCS motor subscore is 5.  Legs held in extension, cannot perform gait assessment. Weak hand grips bilaterally, difficulty following commands to perform CN exam. Bilateral upper extremities with mild to moderate repetative tonic-clonic motion. When asked to smile and/or stick out tongue, patient just stares in my direction but does not perform activity.   Skin: Skin is warm and dry.  Psychiatric: Her mood appears anxious.    ED Course  Procedures (including critical care time) Labs Review Labs Reviewed  GLUCOSE, CAPILLARY - Abnormal; Notable for the following:    Glucose-Capillary 109 (*)    All other components within normal limits   Imaging Review No results found.   MDM   1. Altered mental status   Presentation concerning for CVA. EMS notified for transport. [facility] contacted and spoke with ER Attending MD (Dr. Leonides Schanz) and notified of Code Stroke. IV NS placed at left forearm, placed on O2, labs drawn, CBG obtained (109). Transported by EMS to [facility].    [place], [place] 12/10/13 [phone]

## 2013-12-10 NOTE — Progress Notes (Signed)
Received report from [facility]. Room ready and set up, waiting on arrival of pt.

## 2013-12-10 NOTE — ED Notes (Signed)
Pt undressed, in gown, on monitor, continuous pulse oximetry and blood pressure cuff; family at bedside 

## 2013-12-10 NOTE — Progress Notes (Signed)
PHARMACIST - PHYSICIAN ORDER COMMUNICATION  CONCERNING: P&T Medication Policy on Herbal Medications  DESCRIPTION:  This patient's order for:  melatonin  has been noted.  This product(s) is classified as an "herbal" or natural product. Due to a lack of definitive safety studies or FDA approval, nonstandard manufacturing practices, plus the potential risk of unknown drug-drug interactions while on inpatient medications, the Pharmacy and Therapeutics Committee does not permit the use of "herbal" or natural products of this type within [facility].   ACTION TAKEN: The pharmacy department is unable to verify this order at this time and your patient has been informed of this safety policy. Please reevaluate patient's clinical condition at discharge and address if the herbal or natural product(s) should be resumed at that time. Eudelia Bunch, Pharm.D. [phone] 12/10/2013 8:39 PM

## 2013-12-10 NOTE — H&P (Signed)
[facility] History and Physical  [name] NUU:[id number] DOB: 1931/06/18    PCP:   Cathlean Cower, MD   Chief Complaint: possible seizure activities.  HPI: [name] is an 80 y.o. female with hx of Dm2, HTN, hyperlipidemia, TIA, moderate and progressive dementia, prior CVA (right thalamus and pons by head CT 2013, sent to the urgent care with presentation of tremor and shaking.   She reported she felt the "shake" which started in the occiput, and radiated toward both of her upper extremities.  Family confirmed these, and stated that it took a few minutes between the original symptoms and the rest.  During his time, her speech become somewhat aphasic, and her lips were drawn.  Her lower extremities were tightening up as well.  She was less responsive for a couple of hours.  She had about three incidences for the past month or so.  She was presented originally as code stroke.  Neurology was consulted, and Dr Nicole Kindred saw her, felt it was likely a complex partial seizure, recommended IV keppra and admission for further work up. Her work up included a CT of the head showing old small infarct, none acute. She has no leukocytosis, Hb of 10.5 grams per dL, and Cr of 1.2.   Hospitalist was asked to admit her.  When I saw her, she was stable hemodynamically and was able to converse meaningfully with me, though memory was very poor.  Rewiew of Systems:  Constitutional: Negative for malaise, fever and chills. No significant weight loss or weight gain Eyes: Negative for eye pain, redness and discharge, diplopia, visual changes, or flashes of light. ENMT: Negative for ear pain, hoarseness, nasal congestion, sinus pressure and sore throat. No headaches; tinnitus, drooling, or problem swallowing. Cardiovascular: Negative for chest pain, palpitations, diaphoresis, dyspnea and peripheral edema. ; No orthopnea, PND Respiratory: Negative for cough, hemoptysis, wheezing and stridor. No pleuritic  chestpain. Gastrointestinal: Negative for nausea, vomiting, diarrhea, constipation, abdominal pain, melena, blood in stool, hematemesis, jaundice and rectal bleeding.    Genitourinary: Negative for frequency, dysuria, incontinence,flank pain and hematuria; Musculoskeletal: Negative for back pain and neck pain. Negative for swelling and trauma.;  Skin: . Negative for pruritus, rash, abrasions, bruising and skin lesion.; ulcerations Neuro: Negative for headache, lightheadedness and neck stiffness. Negative for weakness, extremity weakness, burning feet, involuntary movement, seizure and syncope.  Psych: negative for anxiety, depression, insomnia, tearfulness, panic attacks, hallucinations, paranoia, suicidal or homicidal ideation    Past Medical History  Diagnosis Date  . COLONIC POLYPS 06/15/2005  . DIABETES MELLITUS, TYPE II 04/22/2007  . HYPERCHOLESTEROLEMIA   . HYPERLIPIDEMIA   . OBESITY   . ANEMIA-IRON DEFICIENCY   . DEPRESSION   . HYPERTENSION   . ALLERGIC RHINITIS   . ESOPHAGEAL STRICTURE   . GERD   . HIATAL HERNIA   . DIVERTICULOSIS, COLON   . BACK PAIN   . OSTEOPOROSIS   . DIZZINESS   . WEIGHT LOSS   . DYSPNEA   . Other dysphagia   . COLONIC POLYPS, HX OF 04/03/2002 & 06/15/2005    TUBULAR ADENOMA  . History of CVA (cerebrovascular accident) 07/29/2012    Noted old, to right thalamus and pons by Head CT Jul 27, 2012  . Dementia 09/17/2012  . Type II or unspecified type diabetes mellitus without mention of complication, uncontrolled 07/16/2012    Past Surgical History  Procedure Laterality Date  . Appendectomy    . Abdominal hysterectomy    . Tonsillectomy and  adenoidectomy    . Left ankle surgury    . S/p bladder pubovaginal sling    . S/p cystocele/rectocele repair    . Laparoscopic takedown of incarcerated stomach within the chest/nissen  2009  . Esophageal manometry  12/21/2011    Procedure: ESOPHAGEAL MANOMETRY (EM);  Surgeon: Sable Feil, MD;  Location: [facility]  ENDOSCOPY;  Service: Endoscopy;  Laterality: N/A;    Medications:  HOME MEDS: Prior to Admission medications   Medication Sig Start Date End Date Taking? Authorizing Provider  amLODipine (NORVASC) 10 MG tablet Take 10 mg by mouth every morning.   Yes Historical Provider, MD  atorvastatin (LIPITOR) 10 MG tablet Take 10 mg by mouth every morning.   Yes Historical Provider, MD  cetirizine (ZYRTEC) 10 MG tablet Take 10 mg by mouth every morning.    Yes Historical Provider, MD  clonazePAM (KLONOPIN) 0.25 MG disintegrating tablet Take 0.25 mg by mouth 2 (two) times daily as needed (anxiety). 10/04/13  Yes Biagio Borg, MD  clopidogrel (PLAVIX) 75 MG tablet Take 75 mg by mouth daily with breakfast.   Yes Historical Provider, MD  docusate sodium 100 MG CAPS Take 100 mg by mouth 2 (two) times daily. 10/09/13  Yes Janece Canterbury, MD  feeding supplement, GLUCERNA SHAKE, (GLUCERNA SHAKE) LIQD Take 237 mLs by mouth 2 (two) times daily between meals. 10/09/13  Yes Janece Canterbury, MD  ferrous sulfate 325 (65 FE) MG tablet Take 1 tablet (325 mg total) by mouth 3 (three) times daily after meals. 10/09/13  Yes Janece Canterbury, MD  furosemide (LASIX) 20 MG tablet Take 1 tablet (20 mg total) by mouth daily. 10/17/13  Yes Adeline C Viyuoh, MD  insulin glargine (LANTUS) 100 UNIT/ML injection Inject 10 Units into the skin every morning.    Yes Historical Provider, MD  Melatonin 1 MG SUBL Place 1 mg under the tongue at bedtime as needed (for sleep).   Yes Historical Provider, MD  Memantine HCl ER (NAMENDA XR) 28 MG CP24 Take 1 capsule by mouth every morning.   Yes Historical Provider, MD  metFORMIN (GLUCOPHAGE) 500 MG tablet Take 1 tablet (500 mg total) by mouth 2 (two) times daily with a meal. 04/06/13  Yes Biagio Borg, MD  OLANZapine (ZYPREXA) 2.5 MG tablet Take 1 tablet (2.5 mg total) by mouth at bedtime. 12/06/13  Yes Biagio Borg, MD  omeprazole (PRILOSEC) 20 MG capsule Take 20 mg by mouth daily.   Yes Historical  Provider, MD  tamsulosin (FLOMAX) 0.4 MG CAPS capsule Take 0.4 mg by mouth every morning.   Yes Historical Provider, MD     Allergies:  Allergies  Allergen Reactions  . Other Nausea And Vomiting    ANESTHESIA AND STRONG PAIN MEDS-VERY SENSITIVE  . Codeine Nausea And Vomiting and Rash  . Hydrocodone-Acetaminophen Nausea And Vomiting and Rash  . Keflex [Cephalexin] Other (See Comments)    Pt and her son do not know what the reaction is to this medication  . Oxycodone-Acetaminophen Nausea And Vomiting and Rash    Social History:   reports that she has never smoked. She has never used smokeless tobacco. She reports that she does not drink alcohol or use illicit drugs.  Family History: Family History  Problem Relation Age of Onset  . Heart disease Mother 37  . Diabetes Mother   . Heart attack Mother   . Colon cancer Father   . Diabetes Father      Physical Exam: Filed Vitals:   12/10/13  1800 12/10/13 1808 12/10/13 1818 12/10/13 1830  BP: 146/101  130/65 101/56  Pulse: 92  90 88  Temp:  97.9 F (36.6 C)    TempSrc:      Resp: 23  15 13   SpO2:   100%    Blood pressure 101/56, pulse 88, temperature 97.9 F (36.6 C), temperature source Oral, resp. rate 13, SpO2 100.00%.  GEN:  Pleasant patient lying in the stretcher in no acute distress; cooperative with exam. PSYCH:  alert and oriented x4; does not appear anxious or depressed; affect is appropriate. HEENT: Mucous membranes pink and anicteric; PERRLA; EOM intact; no cervical lymphadenopathy nor thyromegaly or carotid bruit; no JVD; There were no stridor. Neck is very supple. Breasts:: Not examined CHEST WALL: No tenderness CHEST: Normal respiration, clear to auscultation bilaterally.  HEART: Regular rate and rhythm.  There are no murmur, rub, or gallops.   BACK: No kyphosis or scoliosis; no CVA tenderness ABDOMEN: soft and non-tender; no masses, no organomegaly, normal abdominal bowel sounds; no pannus; no intertriginous  candida. There is no rebound and no distention. Rectal Exam: Not done EXTREMITIES: No bone or joint deformity; age-appropriate arthropathy of the hands and knees; no edema; no ulcerations.  There is no calf tenderness. Genitalia: not examined PULSES: 2+ and symmetric SKIN: Normal hydration no rash or ulceration CNS: Cranial nerves 2-12 grossly intact no focal lateralizing neurologic deficit.  Speech is fluent; uvula elevated with phonation, facial symmetry and tongue midline. DTR are normal bilaterally, cerebella exam is intact, barbinski is negative and strengths are equaled bilaterally.  No sensory loss.   Labs on Admission:  Basic Metabolic Panel:  Recent Labs Lab 12/10/13 1723 12/10/13 1747  NA 140 140  K 4.2 3.7  CL 97 103  CO2 20  --   GLUCOSE 110* 110*  BUN 18 17  CREATININE 1.20* 1.30*  CALCIUM 9.6  --    Liver Function Tests:  Recent Labs Lab 12/10/13 1723  AST 17  ALT 12  ALKPHOS 55  BILITOT 0.3  PROT 7.7  ALBUMIN 3.6   No results found for this basename: LIPASE, AMYLASE,  in the last 168 hours No results found for this basename: AMMONIA,  in the last 168 hours CBC:  Recent Labs Lab 12/10/13 1723 12/10/13 1747  WBC 8.1  --   NEUTROABS 4.9  --   HGB 9.6* 10.5*  HCT 28.6* 31.0*  MCV 88.0  --   PLT 291  --    Cardiac Enzymes: No results found for this basename: CKTOTAL, CKMB, CKMBINDEX, TROPONINI,  in the last 168 hours  CBG:  Recent Labs Lab 12/10/13 1706  GLUCAP 109*     Radiological Exams on Admission: Ct Head (brain) Wo Contrast  12/10/2013   CLINICAL DATA:  Tremors  EXAM: CT HEAD WITHOUT CONTRAST  TECHNIQUE: Contiguous axial images were obtained from the base of the skull through the vertex without intravenous contrast. Study was obtained within 24 hr of patient's arrival at the emergency department.  COMPARISON:  October 05, 2013 and May 01, 2009  FINDINGS: There is moderate diffuse atrophy. There is no appreciable mass, hemorrhage,  extra-axial fluid collection, or midline shift. There is evidence of a prior infarct in the mid left frontal lobe. There is evidence of a prior small infarct in the right thalamus. There is a prior small infarct in the left pons. There is patchy small vessel disease in the centra semiovale bilaterally. There is no new gray-white compartment lesion. No acute infarct  is appreciable.  An area of relative lucency in the left frontal bone is stable compared to 2010. Bony calvarium appears stable. Mastoid air cells are clear. There are areas of mild mucosal thickening in both maxillary antra.  IMPRESSION: Atrophy with small vessel disease. Prior small infarcts. No acute appearing infarct. No hemorrhage or mass effect. Mild maxillary sinus disease bilaterally.  Critical Value/emergent results were called by telephone at the time of interpretation on 12/10/2013 at 5:40 PM to Dr. Nicole Kindred, neruology , who verbally acknowledged these results.   Electronically Signed   By: Lowella Grip M.D.   On: 12/10/2013 17:41    Assessment/Plan Present on Admission:  . Complex partial seizures . Complex partial seizure . HYPERTENSION . HYPERLIPIDEMIA . Type 2 diabetes, uncontrolled, with neuropathy . TIA (transient ischemic attack) . Dementia . Acute diastolic CHF (congestive heart failure) . Protein-calorie malnutrition, severe . Seizure  PLAN:  Will admit her for complex partial seizure.  I suspect it was from her prior CVA scarring.  Will continue Keppra at 500mg  BID as per recommendation from neurology.  Will obtain MRI brain both with and without contrast to exclude any brain lesion.  EEG was ordered as well.  She is currently stable.  Will place NPO for tonight, but likely able to start carb modified diet tomorrow.  Her medicine for DM2 has been held.  She does have dementia, though able to function quite well.  I clarified her code status with her son and daughter in law and confirmed tonight that she is a DNR.   Will honor her wishes.  She will be admitted to hospitalist service. Thank you for asking me to particpate in her care.  Other plans as per orders.  Code Status: DNR.   Orvan Falconer, MD. [facility] Pager [phone] 7pm to 7am.  12/10/2013, 7:12 PM

## 2013-12-10 NOTE — ED Notes (Signed)
Placed  On  Cardiac  Monitor   Nasal  o2  At      2  l  /  Min       Iv  Ns       20  l  Arm     Angio

## 2013-12-10 NOTE — ED Provider Notes (Addendum)
I saw and evaluated the patient, reviewed the resident's note and I agree with the findings and plan.   EKG Interpretation   Date/Time:  Sunday December 10 2013 17:38:43 EDT Ventricular Rate:  90 PR Interval:    QRS Duration: 87 QT Interval:  439 QTC Calculation: 537 R Axis:   4 Text Interpretation:  Normal sinus rhythm Anterior infarct, old Borderline  repolarization abnormality Prolonged QT interval Confirmed by Magdaleno Lortie,  DO,  Jamilet Ambroise ([id number]) on 12/10/2013 5:50:15 PM      5:15 PM Pt is a 80 y.o. F with history of hyperlipidemia, diabetes, hypertension who presents emergency department from urgent care when she developed right-sided occipital headache and became a fevers at with possible left-sided facial droop. She was having tremors of her bilateral upper lower extremity is been no seizure-like activity in the ER or with EMS. Question of seizure-like activity at urgent care the patient does not appear post ictal.  Her blood glucose is 109. Head CT shows no intracranial hemorrhage or venous infarct. Difficult to obtain accurate NIH stroke scale as patient is unable to follow commands. Dr. Nicole Kindred with neurology at bedside.   6:00 PM  Pt's labs showed mild elevation of her creatinine but otherwise reassuring. Urine pending. Dr. Nicole Kindred feels these may be partial seizures and recommend starting 500 mg IV Keppra and admission to the hospitalist. They report she has had multiple other episodes recently of similar symptoms or she develops right-sided headache and then begins to stare off into the distance and has shaking of her upper tremors bilaterally. PCP is Dr. Cathlean Cower with [facility]. I feel this is a stroke and does not want to start the patient on TPA. She is now back to her baseline.  [place], DO 12/10/13 [place], DO 12/10/13 [place], DO 12/10/13 [id number]

## 2013-12-10 NOTE — ED Notes (Signed)
Dr Le at bedside.  

## 2013-12-10 NOTE — Consult Note (Signed)
Reason for Consult: Recurrent spells of altered consciousness and tremulousness.  HPI:                                                                                                                                          [name] is an 80 y.o. female history of diabetes mellitus, hypertension, hyperlipidemia, depression, stroke and dementia who was brought urgent care and subsequently to [facility] emergency room to rule out possible acute stroke. She arrived in code stroke status. History from patient's son and daughter-in-law indicated that she had had 3 such spells over the past 2 weeks which consisted of a complaint of a feeling of discomfort coming over her head with a flushed appearance and twitching around her mouth as well as tremulousness of extremities. Patient is also been noted to have a glazed look in her eyes and is very slow to respond. Spells typically have been lasting about 2 hours and resolving without intervention.  Tremulousness today was much more pronounced and patient seemed somewhat less responsive verbally. CT scan of her head showed no acute intracranial abnormality. There is no history of seizure activity. She has not been taking any new medications over the past several weeks. Symptoms improved in the emergency room was less tremulousness and patient became more communicative. Code stroke was subsequently canceled.   Past Medical History  Diagnosis Date  . COLONIC POLYPS 06/15/2005  . DIABETES MELLITUS, TYPE II 04/22/2007  . HYPERCHOLESTEROLEMIA   . HYPERLIPIDEMIA   . OBESITY   . ANEMIA-IRON DEFICIENCY   . DEPRESSION   . HYPERTENSION   . ALLERGIC RHINITIS   . ESOPHAGEAL STRICTURE   . GERD   . HIATAL HERNIA   . DIVERTICULOSIS, COLON   . BACK PAIN   . OSTEOPOROSIS   . DIZZINESS   . WEIGHT LOSS   . DYSPNEA   . Other dysphagia   . COLONIC POLYPS, HX OF 04/03/2002 & 06/15/2005    TUBULAR ADENOMA  . History of CVA (cerebrovascular accident) 07/29/2012    Noted  old, to right thalamus and pons by Head CT Jul 27, 2012  . Dementia 09/17/2012  . Type II or unspecified type diabetes mellitus without mention of complication, uncontrolled 07/16/2012    Past Surgical History  Procedure Laterality Date  . Appendectomy    . Abdominal hysterectomy    . Tonsillectomy and adenoidectomy    . Left ankle surgury    . S/p bladder pubovaginal sling    . S/p cystocele/rectocele repair    . Laparoscopic takedown of incarcerated stomach within the chest/nissen  2009  . Esophageal manometry  12/21/2011    Procedure: ESOPHAGEAL MANOMETRY (EM);  Surgeon: Sable Feil, MD;  Location: [facility] ENDOSCOPY;  Service: Endoscopy;  Laterality: N/A;    Family History  Problem Relation Age of Onset  . Heart disease Mother 54  . Diabetes Mother   .  Heart attack Mother   . Colon cancer Father   . Diabetes Father     Social History:  reports that she has never smoked. She has never used smokeless tobacco. She reports that she does not drink alcohol or use illicit drugs.  Allergies  Allergen Reactions  . Other Nausea And Vomiting    ANESTHESIA AND STRONG PAIN MEDS-VERY SENSITIVE  . Codeine Nausea And Vomiting and Rash  . Hydrocodone-Acetaminophen Nausea And Vomiting and Rash  . Keflex [Cephalexin] Other (See Comments)    Pt and her son do not know what the reaction is to this medication  . Oxycodone-Acetaminophen Nausea And Vomiting and Rash    MEDICATIONS:                                                                                                                     I have reviewed the patient's current medications.   ROS:                                                                                                                                       History obtained from child  General ROS: negative for - chills, fatigue, fever, night sweats, weight gain or weight loss Psychological ROS:  mild memory difficulty with known dementia Ophthalmic ROS: negative  for - blurry vision, double vision, eye pain or loss of vision ENT ROS: negative for - epistaxis, nasal discharge, oral lesions, sore throat, tinnitus or vertigo Allergy and Immunology ROS: negative for - hives or itchy/watery eyes Hematological and Lymphatic ROS: negative for - bleeding problems, bruising or swollen lymph nodes Endocrine ROS: negative for - galactorrhea, hair pattern changes, polydipsia/polyuria or temperature intolerance Respiratory ROS: negative for - cough, hemoptysis, shortness of breath or wheezing Cardiovascular ROS: negative for - chest pain, dyspnea on exertion, edema or irregular heartbeat Gastrointestinal ROS: negative for - abdominal pain, diarrhea, hematemesis, nausea/vomiting or stool incontinence Genito-Urinary ROS: negative for - dysuria, hematuria, incontinence or urinary frequency/urgency Musculoskeletal ROS: negative for - joint swelling or muscular weakness Neurological ROS: as noted in HPI Dermatological ROS: negative for rash and skin lesion changes   Blood pressure 130/65, pulse 90, temperature 97.9 F (36.6 C), temperature source Oral, resp. rate 15, SpO2 100.00%.   Neurologic Examination:  Mental Status: Alert, oriented patient was somewhat slow to respond.  Speech fluent without evidence of aphasia. Able to follow commands fairly well. Cranial Nerves: II-Visual fields were normal but appeared to have trouble focusing at times. III/IV/VI-Pupils were equal and reacted. Extraocular movements were full and conjugate.   V/VII-no facial numbness; equivocal mild right facial weakness. VIII-normal. X-normal speech with no dysarthria. Motor: 5/5 bilaterally with normal tone and bulk; resting tremor of both upper extremities as well as chin. Sensory: Normal throughout. Deep Tendon Reflexes: 1+ and symmetric. Plantars: Mute bilaterally Cerebellar: Slightly  impaired finger-to-nose testing with intention tremor.  Lab Results  Component Value Date/Time   CHOL 158 10/04/2013 11:08 AM    Results for orders placed during the hospital encounter of 12/10/13 (from the past 48 hour(s))  PROTIME-INR     Status: None   Collection Time    12/10/13  5:23 PM      Result Value Ref Range   Prothrombin Time 13.0  11.6 - 15.2 seconds   INR 1.00  0.00 - 1.49  APTT     Status: None   Collection Time    12/10/13  5:23 PM      Result Value Ref Range   aPTT 30  24 - 37 seconds  CBC     Status: Abnormal   Collection Time    12/10/13  5:23 PM      Result Value Ref Range   WBC 8.1  4.0 - 10.5 K/uL   RBC 3.25 (*) 3.87 - 5.11 MIL/uL   Hemoglobin 9.6 (*) 12.0 - 15.0 g/dL   HCT 28.6 (*) 36.0 - 46.0 %   MCV 88.0  78.0 - 100.0 fL   MCH 29.5  26.0 - 34.0 pg   MCHC 33.6  30.0 - 36.0 g/dL   RDW 14.4  11.5 - 15.5 %   Platelets 291  150 - 400 K/uL  DIFFERENTIAL     Status: None   Collection Time    12/10/13  5:23 PM      Result Value Ref Range   Neutrophils Relative % 61  43 - 77 %   Neutro Abs 4.9  1.7 - 7.7 K/uL   Lymphocytes Relative 29  12 - 46 %   Lymphs Abs 2.3  0.7 - 4.0 K/uL   Monocytes Relative 8  3 - 12 %   Monocytes Absolute 0.6  0.1 - 1.0 K/uL   Eosinophils Relative 2  0 - 5 %   Eosinophils Absolute 0.2  0.0 - 0.7 K/uL   Basophils Relative 0  0 - 1 %   Basophils Absolute 0.0  0.0 - 0.1 K/uL  COMPREHENSIVE METABOLIC PANEL     Status: Abnormal   Collection Time    12/10/13  5:23 PM      Result Value Ref Range   Sodium 140  137 - 147 mEq/L   Potassium 4.2  3.7 - 5.3 mEq/L   Chloride 97  96 - 112 mEq/L   CO2 20  19 - 32 mEq/L   Glucose, Bld 110 (*) 70 - 99 mg/dL   BUN 18  6 - 23 mg/dL   Creatinine, Ser 1.20 (*) 0.50 - 1.10 mg/dL   Calcium 9.6  8.4 - 10.5 mg/dL   Total Protein 7.7  6.0 - 8.3 g/dL   Albumin 3.6  3.5 - 5.2 g/dL   AST 17  0 - 37 U/L   ALT 12  0 - 35 U/L   Alkaline  Phosphatase 55  39 - 117 U/L   Total Bilirubin 0.3  0.3 -  1.2 mg/dL   GFR calc non Af Amer 42 (*) >90 mL/min   GFR calc Af Amer 48 (*) >90 mL/min   Comment: (NOTE)     The eGFR has been calculated using the CKD EPI equation.     This calculation has not been validated in all clinical situations.     eGFR's persistently <90 mL/min signify possible Chronic Kidney     Disease.  Randolm Idol, ED     Status: None   Collection Time    12/10/13  5:44 PM      Result Value Ref Range   Troponin i, poc 0.01  0.00 - 0.08 ng/mL   Comment 3            Comment: Due to the release kinetics of cTnI,     a negative result within the first hours     of the onset of symptoms does not rule out     myocardial infarction with certainty.     If myocardial infarction is still suspected,     repeat the test at appropriate intervals.  I-STAT CHEM 8, ED     Status: Abnormal   Collection Time    12/10/13  5:47 PM      Result Value Ref Range   Sodium 140  137 - 147 mEq/L   Potassium 3.7  3.7 - 5.3 mEq/L   Chloride 103  96 - 112 mEq/L   BUN 17  6 - 23 mg/dL   Creatinine, Ser 1.30 (*) 0.50 - 1.10 mg/dL   Glucose, Bld 110 (*) 70 - 99 mg/dL   Calcium, Ion 1.07 (*) 1.13 - 1.30 mmol/L   TCO2 22  0 - 100 mmol/L   Hemoglobin 10.5 (*) 12.0 - 15.0 g/dL   HCT 31.0 (*) 36.0 - 46.0 %    Ct Head (brain) Wo Contrast  12/10/2013   CLINICAL DATA:  Tremors  EXAM: CT HEAD WITHOUT CONTRAST  TECHNIQUE: Contiguous axial images were obtained from the base of the skull through the vertex without intravenous contrast. Study was obtained within 24 hr of patient's arrival at the emergency department.  COMPARISON:  October 05, 2013 and May 01, 2009  FINDINGS: There is moderate diffuse atrophy. There is no appreciable mass, hemorrhage, extra-axial fluid collection, or midline shift. There is evidence of a prior infarct in the mid left frontal lobe. There is evidence of a prior small infarct in the right thalamus. There is a prior small infarct in the left pons. There is patchy small  vessel disease in the centra semiovale bilaterally. There is no new gray-white compartment lesion. No acute infarct is appreciable.  An area of relative lucency in the left frontal bone is stable compared to 2010. Bony calvarium appears stable. Mastoid air cells are clear. There are areas of mild mucosal thickening in both maxillary antra.  IMPRESSION: Atrophy with small vessel disease. Prior small infarcts. No acute appearing infarct. No hemorrhage or mass effect. Mild maxillary sinus disease bilaterally.  Critical Value/emergent results were called by telephone at the time of interpretation on 12/10/2013 at 5:40 PM to Dr. Nicole Kindred, neruology , who verbally acknowledged these results.   Electronically Signed   By: Lowella Grip M.D.   On: 12/10/2013 17:41   Assessment/Plan: 80 year old lady with multiple medical disorders, including dementia, presenting with recurrent spells as described above with predictable pattern of mental status change and  motor activity, likely manifestations of complex partial seizure disorder of new onset. Patient has no clinical signs of acute stroke. CT scan showed no acute intracranial abnormality.  Recommendations: 1. Keppra 500 mg every 12 hours 2. MRI of the brain without and with contrast 3. EEG, routine adult study. 4. Admission to telemetry bed  We will continue to follow this patient with you.   C.R. Nicole Kindred, MD [facility] [phone]  12/10/2013, 6:20 PM

## 2013-12-11 ENCOUNTER — Inpatient Hospital Stay (HOSPITAL_COMMUNITY): Payer: Medicare Other

## 2013-12-11 DIAGNOSIS — G40209 Localization-related (focal) (partial) symptomatic epilepsy and epileptic syndromes with complex partial seizures, not intractable, without status epilepticus: Principal | ICD-10-CM

## 2013-12-11 DIAGNOSIS — N179 Acute kidney failure, unspecified: Secondary | ICD-10-CM

## 2013-12-11 DIAGNOSIS — R569 Unspecified convulsions: Secondary | ICD-10-CM

## 2013-12-11 DIAGNOSIS — E1149 Type 2 diabetes mellitus with other diabetic neurological complication: Secondary | ICD-10-CM

## 2013-12-11 DIAGNOSIS — E1142 Type 2 diabetes mellitus with diabetic polyneuropathy: Secondary | ICD-10-CM

## 2013-12-11 LAB — COMPREHENSIVE METABOLIC PANEL
ALK PHOS: 50 U/L (ref 39–117)
ALT: 10 U/L (ref 0–35)
AST: 13 U/L (ref 0–37)
Albumin: 3.1 g/dL — ABNORMAL LOW (ref 3.5–5.2)
BILIRUBIN TOTAL: 0.3 mg/dL (ref 0.3–1.2)
BUN: 13 mg/dL (ref 6–23)
CHLORIDE: 105 meq/L (ref 96–112)
CO2: 25 meq/L (ref 19–32)
Calcium: 9 mg/dL (ref 8.4–10.5)
Creatinine, Ser: 0.98 mg/dL (ref 0.50–1.10)
GFR calc Af Amer: 61 mL/min — ABNORMAL LOW (ref >90)
GFR, EST NON AFRICAN AMERICAN: 53 mL/min — AB (ref >90)
Glucose, Bld: 144 mg/dL — ABNORMAL HIGH (ref 70–99)
POTASSIUM: 3.6 meq/L — AB (ref 3.7–5.3)
Sodium: 145 mEq/L (ref 137–147)
Total Protein: 6.8 g/dL (ref 6.0–8.3)

## 2013-12-11 LAB — GLUCOSE, CAPILLARY
GLUCOSE-CAPILLARY: 289 mg/dL — AB (ref 70–99)
GLUCOSE-CAPILLARY: 65 mg/dL — AB (ref 70–99)
Glucose-Capillary: 113 mg/dL — ABNORMAL HIGH (ref 70–99)
Glucose-Capillary: 163 mg/dL — ABNORMAL HIGH (ref 70–99)
Glucose-Capillary: 113 mg/dL — ABNORMAL HIGH (ref 70–99)

## 2013-12-11 MED ORDER — ONDANSETRON HCL 4 MG/2ML IJ SOLN
INTRAMUSCULAR | Status: AC
  Filled 2013-12-11: qty 2

## 2013-12-11 MED ORDER — ONDANSETRON HCL 4 MG/2ML IJ SOLN
4.0000 mg | Freq: Four times a day (QID) | INTRAMUSCULAR | Status: DC | PRN
  Administered 2013-12-11: 4 mg via INTRAVENOUS

## 2013-12-11 NOTE — Procedures (Signed)
ELECTROENCEPHALOGRAM REPORT  Patient: [name]       Room #: 3N36 EEG No. ID: [id number] Age: 80 y.o.        Sex: female Referring Physician: Wyline Copas Report Date:  12/11/2013        Interpreting Physician: Anthony Sar  History: [name] is an 80 y.o. female who's been experiencing recurrent spells of becoming flushing and complaining of headache with associated staring and reduced responsiveness as well as tremulousness of extremities and confusion.  Indications for study:  Rule out new-onset complex partial seizure disorder.  Technique: This is an 18 channel routine scalp EEG performed at the bedside with bipolar and monopolar montages arranged in accordance to the international 10/20 system of electrode placement.   Description:this EEG recording was performed during wakefulness. Predominate background activity consists of 9 Hz symmetrical alpha rhythm is most prominent posteriorly. Photic stimulation was not performed. Hyperventilation was not performed. Patient had a pattern of muscle artifact consistent with a 3 Hz tremor. No epileptiform discharges were recorded. There was no abnormal slowing of cerebral activity.  Interpretation: this is a normal EEG recording during wakefulness. No evidence of an epileptic disorder is demonstrated. Muscle artifact occurring with irregular frequency of 3 Hz is consistent with tremor, likely involuntary.   Rush Farmer M.D. [facility] [phone]

## 2013-12-11 NOTE — Progress Notes (Addendum)
Hypoglycemic Event  CBG: 65  Treatment: 8oz orange juice, graham crackers and peanut butter  Symptoms: no symptoms  Follow-up CBG: Time: 2158 CBG Result: 113  Possible Reasons for Event: sliding scale not correct for pt.  Comments/MD notified: K Schorr [phone]   [place], [name]  Remember to initiate Hypoglycemia Order Set & complete

## 2013-12-11 NOTE — Progress Notes (Signed)
[facility] PROGRESS NOTE  [name] WER:[id number] DOB: 19-Nov-1930 DOA: 12/10/2013 PCP: Cathlean Cower, MD  Assessment/Plan: 1. Seizures 1. Neurology consulted 2. MRI pending 3. Currently on Keppra 500mg  bid per neurology recs 4. EEG ordered, pending results 2. HTN 1. BP stable 2. Cont current regimen 3. DM 1. Hyperglycemics were temporarily held as NPO orders were placed initially 2. Currently on Carb Modified Diet 3. On SSI coverage 4. Dementia 1. Stable 5. DVT prophylaxis 1. Heparin subQ  Code Status: DNR Family Communication: Pt in room Disposition Plan: Pending   Consultants:  Neurology  Procedures:    Antibiotics:     HPI/Subjective: Feels nauseated this AM. Otherwise, without complaints.  Objective: Filed Vitals:   12/10/13 2022 12/10/13 2128 12/11/13 0120 12/11/13 0551  BP: 131/62 144/54 140/53 133/59  Pulse: 84 80 77 80  Temp: 97.9 F (36.6 C) 97.7 F (36.5 C) 98.6 F (37 C) 98.3 F (36.8 C)  TempSrc: Oral Tympanic Oral Oral  Resp: 18 18 16 16   Height: 5\' 3"  (1.6 m)     Weight: 67.45 kg (148 lb 11.2 oz)     SpO2: 100% 98% 99% 100%    Intake/Output Summary (Last 24 hours) at 12/11/13 0815 Last data filed at 12/11/13 0600  Gross per 24 hour  Intake      0 ml  Output    800 ml  Net   -800 ml   Filed Weights   12/10/13 2022  Weight: 67.45 kg (148 lb 11.2 oz)    Exam:   General:  Awake, in nad  Cardiovascular: regular, s1, s2  Respiratory: normal resp effort, no wheezing  Abdomen: soft, nondistended  Musculoskeletal: perfused, no clubbing   Data Reviewed: Basic Metabolic Panel:  Recent Labs Lab 12/10/13 1723 12/10/13 1747  NA 140 140  K 4.2 3.7  CL 97 103  CO2 20  --   GLUCOSE 110* 110*  BUN 18 17  CREATININE 1.20* 1.30*  CALCIUM 9.6  --    Liver Function Tests:  Recent Labs Lab 12/10/13 1723  AST 17  ALT 12  ALKPHOS 55  BILITOT 0.3  PROT 7.7  ALBUMIN 3.6   No results found for this basename:  LIPASE, AMYLASE,  in the last 168 hours No results found for this basename: AMMONIA,  in the last 168 hours CBC:  Recent Labs Lab 12/10/13 1723 12/10/13 1747  WBC 8.1  --   NEUTROABS 4.9  --   HGB 9.6* 10.5*  HCT 28.6* 31.0*  MCV 88.0  --   PLT 291  --    Cardiac Enzymes: No results found for this basename: CKTOTAL, CKMB, CKMBINDEX, TROPONINI,  in the last 168 hours BNP (last 3 results)  Recent Labs  05/22/13 1640 10/13/13 1305 10/16/13 0423  PROBNP 173.3 1431.0* 718.6*   CBG:  Recent Labs Lab 12/10/13 1706 12/10/13 2140 12/11/13 0708  GLUCAP 109* 151* 113*    No results found for this or any previous visit (from the past 240 hour(s)).   Studies: Ct Head (brain) Wo Contrast  12/10/2013   CLINICAL DATA:  Tremors  EXAM: CT HEAD WITHOUT CONTRAST  TECHNIQUE: Contiguous axial images were obtained from the base of the skull through the vertex without intravenous contrast. Study was obtained within 24 hr of patient's arrival at the emergency department.  COMPARISON:  October 05, 2013 and May 01, 2009  FINDINGS: There is moderate diffuse atrophy. There is no appreciable mass, hemorrhage, extra-axial fluid collection, or midline shift.  There is evidence of a prior infarct in the mid left frontal lobe. There is evidence of a prior small infarct in the right thalamus. There is a prior small infarct in the left pons. There is patchy small vessel disease in the centra semiovale bilaterally. There is no new gray-white compartment lesion. No acute infarct is appreciable.  An area of relative lucency in the left frontal bone is stable compared to 2010. Bony calvarium appears stable. Mastoid air cells are clear. There are areas of mild mucosal thickening in both maxillary antra.  IMPRESSION: Atrophy with small vessel disease. Prior small infarcts. No acute appearing infarct. No hemorrhage or mass effect. Mild maxillary sinus disease bilaterally.  Critical Value/emergent results were called  by telephone at the time of interpretation on 12/10/2013 at 5:40 PM to Dr. Nicole Kindred, neruology , who verbally acknowledged these results.   Electronically Signed   By: Lowella Grip M.D.   On: 12/10/2013 17:41    Scheduled Meds: . amLODipine  10 mg Oral q morning - 10a  . atorvastatin  10 mg Oral q morning - 10a  . clopidogrel  75 mg Oral Q breakfast  . docusate sodium  100 mg Oral BID  . ferrous sulfate  325 mg Oral TID PC  . furosemide  20 mg Oral Daily  . heparin  5,000 Units Subcutaneous 3 times per day  . insulin aspart  0-5 Units Subcutaneous QHS  . insulin aspart  0-9 Units Subcutaneous TID WC  . levETIRAcetam  500 mg Oral BID  . loratadine  10 mg Oral Daily  . Memantine HCl ER  1 capsule Oral q morning - 10a  . OLANZapine  2.5 mg Oral QHS  . pantoprazole  80 mg Oral Daily  . tamsulosin  0.4 mg Oral q morning - 10a   Continuous Infusions:   Principal Problem:   Complex partial seizure Active Problems:   HYPERLIPIDEMIA   HYPERTENSION   Type 2 diabetes, uncontrolled, with neuropathy   TIA (transient ischemic attack)   Dementia   Acute diastolic CHF (congestive heart failure)   Protein-calorie malnutrition, severe   Complex partial seizures   Seizure  Time spent: 57min  [facility], [facility] Pager [phone]. If 7PM-7AM, please contact night-coverage at [email or web address], password [facility] 12/11/2013, 8:15 AM  LOS: 1 day

## 2013-12-11 NOTE — Progress Notes (Signed)
Chaplain met with family and provided insight for AD.  Family has done one in the past and was aware of how to fill document out.  They will most likely need the document notarized tomorrow.

## 2013-12-11 NOTE — Progress Notes (Signed)
NEURO HOSPITALIST PROGRESS NOTE   SUBJECTIVE:                                                                                                                        Patient is alert and oriented, shows a slight tremor of her chin and left hand that will stop when movement is initiated.    OBJECTIVE:                                                                                                                           Vital signs in last 24 hours: Temp:  [97.5 F (36.4 C)-98.6 F (37 C)] 97.9 F (36.6 C) (04/06 1030) Pulse Rate:  [77-98] 81 (04/06 1030) Resp:  [13-25] 16 (04/06 1030) BP: (101-166)/(52-101) 148/57 mmHg (04/06 1030) SpO2:  [96 %-100 %] 96 % (04/06 1030) Weight:  [67.45 kg (148 lb 11.2 oz)] 67.45 kg (148 lb 11.2 oz) (04/05 2022)  Intake/Output from previous day: 04/05 0701 - 04/06 0700 In: -  Out: 800 [Urine:800] Intake/Output this shift:   Nutritional status: Carb Control  Past Medical History  Diagnosis Date  . COLONIC POLYPS 06/15/2005  . DIABETES MELLITUS, TYPE II 04/22/2007  . HYPERCHOLESTEROLEMIA   . HYPERLIPIDEMIA   . OBESITY   . ANEMIA-IRON DEFICIENCY   . DEPRESSION   . HYPERTENSION   . ALLERGIC RHINITIS   . ESOPHAGEAL STRICTURE   . GERD   . HIATAL HERNIA   . DIVERTICULOSIS, COLON   . BACK PAIN   . OSTEOPOROSIS   . DIZZINESS   . WEIGHT LOSS   . DYSPNEA   . Other dysphagia   . COLONIC POLYPS, HX OF 04/03/2002 & 06/15/2005    TUBULAR ADENOMA  . History of CVA (cerebrovascular accident) 07/29/2012    Noted old, to right thalamus and pons by Head CT Jul 27, 2012  . Dementia 09/17/2012  . Type II or unspecified type diabetes mellitus without mention of complication, uncontrolled 07/16/2012     Neurologic Exam:  Mental Status: Alert, oriented, thought content appropriate.  Speech fluent without evidence of aphasia.  Able to follow 3 step commands without difficulty. Cranial Nerves: II: Visual fields  grossly normal, pupils equal, round, reactive to light and accommodation III,IV,  VI: ptosis not present, extra-ocular motions intact bilaterally V,VII: smile symmetric, facial light touch sensation normal bilaterally, tremor of jaw which is more on the left side.  VIII: hearing normal bilaterally IX,X: gag reflex present XI: bilateral shoulder shrug XII: midline tongue extension without atrophy or fasciculations  Motor: Right : Upper extremity   5/5    Left:     Upper extremity   5/5  Lower extremity   5/5     Lower extremity   5/5 --mild tremor of the left hand and thumb which stops when action is initiated.  Tone and bulk:normal tone throughout; no atrophy noted Sensory: Pinprick and light touch intact throughout, bilaterally Deep Tendon Reflexes:  1+ and symmetric  Plantars: Mute bilaterally    Lab Results: Basic Metabolic Panel:  Recent Labs Lab 12/10/13 1723 12/10/13 1747  NA 140 140  K 4.2 3.7  CL 97 103  CO2 20  --   GLUCOSE 110* 110*  BUN 18 17  CREATININE 1.20* 1.30*  CALCIUM 9.6  --     Liver Function Tests:  Recent Labs Lab 12/10/13 1723  AST 17  ALT 12  ALKPHOS 55  BILITOT 0.3  PROT 7.7  ALBUMIN 3.6   No results found for this basename: LIPASE, AMYLASE,  in the last 168 hours No results found for this basename: AMMONIA,  in the last 168 hours  CBC:  Recent Labs Lab 12/10/13 1723 12/10/13 1747  WBC 8.1  --   NEUTROABS 4.9  --   HGB 9.6* 10.5*  HCT 28.6* 31.0*  MCV 88.0  --   PLT 291  --     Cardiac Enzymes: No results found for this basename: CKTOTAL, CKMB, CKMBINDEX, TROPONINI,  in the last 168 hours  Lipid Panel: No results found for this basename: CHOL, TRIG, HDL, CHOLHDL, VLDL, LDLCALC,  in the last 168 hours  CBG:  Recent Labs Lab 12/10/13 1706 12/10/13 2140 12/11/13 0708  GLUCAP 109* 151* 113*    Microbiology: Results for orders placed during the hospital encounter of 10/05/13  CLOSTRIDIUM DIFFICILE BY PCR      Status: None   Collection Time    10/05/13  7:48 PM      Result Value Ref Range Status   C difficile by pcr NEGATIVE  NEGATIVE Final   Comment: Performed at [place]     Status: None   Collection Time    10/05/13  9:11 PM      Result Value Ref Range Status   Specimen Description URINE, CATHETERIZED   Final   Special Requests NONE   Final   Culture  Setup Time     Final   Value: 10/06/2013 10:56     Performed at [facility]     Final   Value: NO GROWTH     Performed at [facility]   Culture     Final   Value: NO GROWTH     Performed at [facility]   Report Status 10/07/2013 FINAL   Final  CULTURE, BLOOD (ROUTINE X 2)     Status: None   Collection Time    10/07/13  5:03 PM      Result Value Ref Range Status   Specimen Description BLOOD LEFT ARM   Final   Special Requests BOTTLES DRAWN AEROBIC AND ANAEROBIC 10CC   Final   Culture  Setup Time     Final   Value:  10/07/2013 20:20     Performed at [facility]     Final   Value: NO GROWTH 5 DAYS     Performed at [facility]   Report Status 10/13/2013 FINAL   Final  CULTURE, BLOOD (ROUTINE X 2)     Status: None   Collection Time    10/07/13  5:16 PM      Result Value Ref Range Status   Specimen Description BLOOD RIGHT ARM   Final   Special Requests BOTTLES DRAWN AEROBIC AND ANAEROBIC 10CC   Final   Culture  Setup Time     Final   Value: 10/07/2013 20:20     Performed at [facility]   Culture     Final   Value: NO GROWTH 5 DAYS     Performed at [facility]   Report Status 10/13/2013 FINAL   Final    Coagulation Studies:  Recent Labs  12/10/13 1723  LABPROT 13.0  INR 1.00    Imaging: Ct Head (brain) Wo Contrast  12/10/2013   CLINICAL DATA:  Tremors  EXAM: CT HEAD WITHOUT CONTRAST  TECHNIQUE: Contiguous axial images were obtained from the base of the skull through the vertex without intravenous contrast.  Study was obtained within 24 hr of patient's arrival at the emergency department.  COMPARISON:  October 05, 2013 and May 01, 2009  FINDINGS: There is moderate diffuse atrophy. There is no appreciable mass, hemorrhage, extra-axial fluid collection, or midline shift. There is evidence of a prior infarct in the mid left frontal lobe. There is evidence of a prior small infarct in the right thalamus. There is a prior small infarct in the left pons. There is patchy small vessel disease in the centra semiovale bilaterally. There is no new gray-white compartment lesion. No acute infarct is appreciable.  An area of relative lucency in the left frontal bone is stable compared to 2010. Bony calvarium appears stable. Mastoid air cells are clear. There are areas of mild mucosal thickening in both maxillary antra.  IMPRESSION: Atrophy with small vessel disease. Prior small infarcts. No acute appearing infarct. No hemorrhage or mass effect. Mild maxillary sinus disease bilaterally.  Critical Value/emergent results were called by telephone at the time of interpretation on 12/10/2013 at 5:40 PM to Dr. Roseanne Reno, neruology , who verbally acknowledged these results.   Electronically Signed   By: Bretta Bang M.D.   On: 12/10/2013 17:41       MEDICATIONS                                                                                                                        Scheduled: . amLODipine  10 mg Oral q morning - 10a  . atorvastatin  10 mg Oral q morning - 10a  . clopidogrel  75 mg Oral Q breakfast  . docusate sodium  100 mg Oral BID  . ferrous sulfate  325 mg Oral TID PC  .  furosemide  20 mg Oral Daily  . heparin  5,000 Units Subcutaneous 3 times per day  . insulin aspart  0-5 Units Subcutaneous QHS  . insulin aspart  0-9 Units Subcutaneous TID WC  . levETIRAcetam  500 mg Oral BID  . loratadine  10 mg Oral Daily  . Memantine HCl ER  1 capsule Oral q morning - 10a  . OLANZapine  2.5 mg Oral QHS  .  ondansetron      . pantoprazole  80 mg Oral Daily  . tamsulosin  0.4 mg Oral q morning - 10a    ASSESSMENT/PLAN:                                                                                                             Possible new onset seizure with twitching around her mouth and tremulousness of extremities. Currently awake and oriented showing mild tremor around her mouth and in left hand.  Tremor will cease wen action is initiated. Started on Keppra 500 mg BID and tolerating well.  MRI and EEG pending.     Assessment and plan discussed with with attending physician and they are in agreement.    Etta Quill PA-C [facility] [phone]  12/11/2013, 11:05 AM

## 2013-12-11 NOTE — Progress Notes (Signed)
EEG Completed; Results Pending  

## 2013-12-12 ENCOUNTER — Inpatient Hospital Stay (HOSPITAL_COMMUNITY): Payer: Medicare Other

## 2013-12-12 ENCOUNTER — Ambulatory Visit: Payer: Medicare Other | Admitting: Internal Medicine

## 2013-12-12 DIAGNOSIS — E860 Dehydration: Secondary | ICD-10-CM

## 2013-12-12 LAB — GLUCOSE, CAPILLARY
GLUCOSE-CAPILLARY: 232 mg/dL — AB (ref 70–99)
GLUCOSE-CAPILLARY: 200 mg/dL — AB (ref 70–99)
Glucose-Capillary: 139 mg/dL — ABNORMAL HIGH (ref 70–99)
Glucose-Capillary: 143 mg/dL — ABNORMAL HIGH (ref 70–99)

## 2013-12-12 MED ORDER — GADOBENATE DIMEGLUMINE 529 MG/ML IV SOLN
15.0000 mL | Freq: Once | INTRAVENOUS | Status: AC
  Administered 2013-12-12: 14 mL via INTRAVENOUS

## 2013-12-12 MED ORDER — MORPHINE SULFATE 2 MG/ML IJ SOLN
2.0000 mg | Freq: Once | INTRAMUSCULAR | Status: AC
  Administered 2013-12-12: 2 mg via INTRAVENOUS

## 2013-12-12 MED ORDER — MORPHINE SULFATE 2 MG/ML IJ SOLN
INTRAMUSCULAR | Status: AC
  Administered 2013-12-12: 2 mg via INTRAVENOUS
  Filled 2013-12-12: qty 1

## 2013-12-12 MED ORDER — LORAZEPAM 2 MG/ML IJ SOLN: 1.0000 mg | Freq: Once | INTRAMUSCULAR | Status: DC

## 2013-12-12 NOTE — Progress Notes (Signed)
   CARE MANAGEMENT NOTE 12/12/2013  Patient:  [name], [name]   Account Number:  [id number]  Date Initiated:  12/12/2013  Documentation initiated by:  Olga Coaster  Subjective/Objective Assessment:   ADMITTED WITH SEIZURE     Action/Plan:   CM FOLLOWING FOR DCP   Anticipated DC Date:  12/14/2013   Anticipated DC Plan:  POSSIBLY HOME/SELF CARE    DC Planning Services  CM consult       Status of service:  In process, will continue to follow Medicare Important Message given?  NA - LOS <3 / Initial given by admissions (If response is "NO", the following Medicare IM given date fields will be blank)  Per UR Regulation:  Reviewed for med. necessity/level of care/duration of stay  Comments:  4/7/2015Mindi Slicker RN,BSN,MHA [phone]

## 2013-12-12 NOTE — Progress Notes (Signed)
Inpatient Diabetes Program Recommendations  AACE/ADA: New Consensus Statement on Inpatient Glycemic Control (2013)  Target Ranges:  Prepandial:   less than 140 mg/dL      Peak postprandial:   less than 180 mg/dL (1-2 hours)      Critically ill patients:  140 - 180 mg/dL   Reason for Assessment: Mild hypoglycemia, Mild hyperglycemia  Diabetes history: Type 2 diabetes Outpatient Diabetes medications: Lantus 10 units every am, Metformin 500 mg BID Current orders for Inpatient glycemic control: Novolog sensitive correction scale tid, HS Novolog correction scale  Note:  Last A1C was 7.4 in January 2015.  Please consider ordering an updated A1C to help rule out glycemic control issues contributing to neurologic problems.  Thank you.  Julia Yin S. Marcelline Mates, RN, MSN, CDE Inpatient Diabetes Program, team pager [phone]

## 2013-12-12 NOTE — Progress Notes (Signed)
PT Cancellation Note  Patient Details Name: [name] MRN: [id number] DOB: 07/17/1931   Cancelled Treatment:    Reason Eval/Treat Not Completed: Medical issues which prohibited therapy (per nsg will hold PT at this time, [id number] tremors)   [name] 12/12/2013, 10:17 AM Alben Deeds, PT DPT  [phone]

## 2013-12-12 NOTE — Progress Notes (Addendum)
Subjective: No complaints at this time--feels the tremors have worsened.   Objective: Current vital signs: BP 161/75  Pulse 83  Temp(Src) 98.3 F (36.8 C) (Oral)  Resp 16  Ht 5\' 3"  (1.6 m)  Wt 67.45 kg (148 lb 11.2 oz)  BMI 26.35 kg/m2  SpO2 100% Vital signs in last 24 hours: Temp:  [97.7 F (36.5 C)-98.3 F (36.8 C)] 98.3 F (36.8 C) (04/07 0940) Pulse Rate:  [73-87] 83 (04/07 0940) Resp:  [16-18] 16 (04/07 0940) BP: (115-161)/(44-75) 161/75 mmHg (04/07 0940) SpO2:  [96 %-100 %] 100 % (04/07 0940)  Intake/Output from previous day: 04/06 0701 - 04/07 0700 In: 120 [P.O.:120] Out: -  Intake/Output this shift:   Nutritional status: Carb Control  Neurologic Exam: Mental Status: Alert, not oriented to place or time.  Speech fluent without evidence of aphasia.  Able to follow 3 step commands without difficulty. Cranial Nerves: II: Visual fields grossly normal, pupils equal, round, reactive to light and accommodation III,IV, VI: ptosis not present, extra-ocular motions intact bilaterally V,VII: smile symmetric, facial light touch sensation normal bilaterally VIII: hearing normal bilaterally IX,X: gag reflex present XI: bilateral shoulder shrug XII: midline tongue extension without atrophy or fasciculations --at rest she shows a bilateral facial tremor that becomes more pronounced when she is upset and stops when she is talking or moving the affected part of the face.   Motor: Right : Upper extremity   5/5    Left:     Upper extremity   5/5  Lower extremity   5/5     Lower extremity   5/5 --bilateral UE tremor which is both resting and postural, becomes more pronounced when she is upset and stops when she taking part in command.   Tone and bulk:normal tone throughout; no atrophy noted Sensory: Pinprick and light touch intact throughout, bilaterally Deep Tendon Reflexes:  1+ and symmetric  Plantars: Mute bilaterally    Lab Results: Basic Metabolic Panel:  Recent  Labs Lab 12/10/13 1723 12/10/13 1747 12/11/13 1005  NA 140 140 145  K 4.2 3.7 3.6*  CL 97 103 105  CO2 20  --  25  GLUCOSE 110* 110* 144*  BUN 18 17 13   CREATININE 1.20* 1.30* 0.98  CALCIUM 9.6  --  9.0    Liver Function Tests:  Recent Labs Lab 12/10/13 1723 12/11/13 1005  AST 17 13  ALT 12 10  ALKPHOS 55 50  BILITOT 0.3 0.3  PROT 7.7 6.8  ALBUMIN 3.6 3.1*   No results found for this basename: LIPASE, AMYLASE,  in the last 168 hours No results found for this basename: AMMONIA,  in the last 168 hours  CBC:  Recent Labs Lab 12/10/13 1723 12/10/13 1747  WBC 8.1  --   NEUTROABS 4.9  --   HGB 9.6* 10.5*  HCT 28.6* 31.0*  MCV 88.0  --   PLT 291  --     Cardiac Enzymes: No results found for this basename: CKTOTAL, CKMB, CKMBINDEX, TROPONINI,  in the last 168 hours  Lipid Panel: No results found for this basename: CHOL, TRIG, HDL, CHOLHDL, VLDL, LDLCALC,  in the last 168 hours  CBG:  Recent Labs Lab 12/11/13 1137 12/11/13 1617 12/11/13 2124 12/11/13 2159 12/12/13 0700  GLUCAP 163* 289* 65* 113* 139*    Microbiology: Results for orders placed during the hospital encounter of 10/05/13  CLOSTRIDIUM DIFFICILE BY PCR     Status: None   Collection Time    10/05/13  7:48  PM      Result Value Ref Range Status   C difficile by pcr NEGATIVE  NEGATIVE Final   Comment: Performed at [place]     Status: None   Collection Time    10/05/13  9:11 PM      Result Value Ref Range Status   Specimen Description URINE, CATHETERIZED   Final   Special Requests NONE   Final   Culture  Setup Time     Final   Value: 10/06/2013 10:56     Performed at [facility]     Final   Value: NO GROWTH     Performed at [facility]   Culture     Final   Value: NO GROWTH     Performed at [facility]   Report Status 10/07/2013 FINAL   Final  CULTURE, BLOOD (ROUTINE X 2)     Status: None   Collection Time     10/07/13  5:03 PM      Result Value Ref Range Status   Specimen Description BLOOD LEFT ARM   Final   Special Requests BOTTLES DRAWN AEROBIC AND ANAEROBIC 10CC   Final   Culture  Setup Time     Final   Value: 10/07/2013 20:20     Performed at [facility]   Culture     Final   Value: NO GROWTH 5 DAYS     Performed at [facility]   Report Status 10/13/2013 FINAL   Final  CULTURE, BLOOD (ROUTINE X 2)     Status: None   Collection Time    10/07/13  5:16 PM      Result Value Ref Range Status   Specimen Description BLOOD RIGHT ARM   Final   Special Requests BOTTLES DRAWN AEROBIC AND ANAEROBIC 10CC   Final   Culture  Setup Time     Final   Value: 10/07/2013 20:20     Performed at [facility]   Culture     Final   Value: NO GROWTH 5 DAYS     Performed at [facility]   Report Status 10/13/2013 FINAL   Final    Coagulation Studies:  Recent Labs  12/10/13 1723  LABPROT 13.0  INR 1.00    Imaging: Ct Head (brain) Wo Contrast  12/10/2013   CLINICAL DATA:  Tremors  EXAM: CT HEAD WITHOUT CONTRAST  TECHNIQUE: Contiguous axial images were obtained from the base of the skull through the vertex without intravenous contrast. Study was obtained within 24 hr of patient's arrival at the emergency department.  COMPARISON:  October 05, 2013 and May 01, 2009  FINDINGS: There is moderate diffuse atrophy. There is no appreciable mass, hemorrhage, extra-axial fluid collection, or midline shift. There is evidence of a prior infarct in the mid left frontal lobe. There is evidence of a prior small infarct in the right thalamus. There is a prior small infarct in the left pons. There is patchy small vessel disease in the centra semiovale bilaterally. There is no new gray-white compartment lesion. No acute infarct is appreciable.  An area of relative lucency in the left frontal bone is stable compared to 2010. Bony calvarium appears stable. Mastoid air cells are clear. There are  areas of mild mucosal thickening in both maxillary antra.  IMPRESSION: Atrophy with small vessel disease. Prior small infarcts. No acute appearing infarct. No hemorrhage or mass effect. Mild  maxillary sinus disease bilaterally.  Critical Value/emergent results were called by telephone at the time of interpretation on 12/10/2013 at 5:40 PM to Dr. Nicole Kindred, neruology , who verbally acknowledged these results.   Electronically Signed   By: Lowella Grip M.D.   On: 12/10/2013 17:41   Dg Abd Portable 1v  12/11/2013   CLINICAL DATA:  Nausea, history diabetes, hypertension, hiatal hernia  EXAM: PORTABLE ABDOMEN - 1 VIEW  COMPARISON:  Portable exam 1020 hr compared to 10/05/2013  FINDINGS: Normal bowel gas pattern.  No bowel dilatation or bowel wall thickening.  Osseous demineralization with levoconvex thoracolumbar scoliosis.  Scattered atherosclerotic calcifications aorta.  Left pelvic phlebolith.  No definite urinary tract calcification.  IMPRESSION: Nonobstructive bowel gas pattern.   Electronically Signed   By: Lavonia Dana M.D.   On: 12/11/2013 11:43    Medications:  Scheduled: . amLODipine  10 mg Oral q morning - 10a  . atorvastatin  10 mg Oral q morning - 10a  . clopidogrel  75 mg Oral Q breakfast  . docusate sodium  100 mg Oral BID  . ferrous sulfate  325 mg Oral TID PC  . furosemide  20 mg Oral Daily  . heparin  5,000 Units Subcutaneous 3 times per day  . insulin aspart  0-5 Units Subcutaneous QHS  . insulin aspart  0-9 Units Subcutaneous TID WC  . levETIRAcetam  500 mg Oral BID  . loratadine  10 mg Oral Daily  . LORazepam  1 mg Intravenous Once  . Memantine HCl ER  1 capsule Oral q morning - 10a  . morphine      . OLANZapine  2.5 mg Oral QHS  . pantoprazole  80 mg Oral Daily  . tamsulosin  0.4 mg Oral q morning - 10a    EEG Interpretation: this is a normal EEG recording during wakefulness. No evidence of an epileptic disorder is demonstrated. Muscle artifact occurring with irregular  frequency of 3 Hz is consistent with tremor, likely involuntary.  MRI pending  Assessment/Plan: Bilateral UE and facial tremor in the setting of negative EEG.  MRI pending.   Recommend: 1) Continue Keppra 500 mg BID and would continue at time of discharge 2) will need out patient follow up with neurology for both possible early onset parkinson's and seizure 3) Will follow MRI    LOS: 2 days    12/12/2013  10:57 AM

## 2013-12-12 NOTE — Progress Notes (Signed)
[facility] PROGRESS NOTE  [name] UMP:[id number] DOB: 07-03-1931 DOA: 12/10/2013 PCP: Cathlean Cower, MD  Assessment/Plan: 1. Shakes 1. Neurology consulted and following 2. MRI pending today 3. Currently on empiric Keppra 500mg  bid per neurology recs 4. EEG with findings of involuntary muscle tremors 2. HTN 1. BP stable 2. Cont current regimen 3. DM 1. Hyperglycemics were temporarily held as NPO orders were placed initially 2. Currently on Carb Modified Diet 3. On SSI coverage 4. Dementia 1. Stable 5. DVT prophylaxis 1. Heparin subQ  Code Status: DNR Family Communication: Pt and son in room Disposition Plan: Pending Placement - PT/OT consulted   Consultants:  Neurology  Procedures:    Antibiotics:     HPI/Subjective: Increased shakes this AM. Going for MRI today  Objective: Filed Vitals:   12/11/13 2125 12/12/13 0140 12/12/13 0556 12/12/13 0940  BP: 139/53 138/56 147/63 161/75  Pulse: 82 73 79 83  Temp: 98.3 F (36.8 C) 97.7 F (36.5 C) 98 F (36.7 C) 98.3 F (36.8 C)  TempSrc: Oral Oral Oral Oral  Resp: 16 16 16 16   Height:      Weight:      SpO2: 96% 97% 97% 100%    Intake/Output Summary (Last 24 hours) at 12/12/13 1058 Last data filed at 12/11/13 1700  Gross per 24 hour  Intake    120 ml  Output      0 ml  Net    120 ml   Filed Weights   12/10/13 2022  Weight: 67.45 kg (148 lb 11.2 oz)    Exam:  General:  Awake, in nad  Cardiovascular: regular, s1, s2  Respiratory: normal resp effort, no wheezing  Abdomen: soft, nondistended  Musculoskeletal: perfused, no clubbing   Data Reviewed: Basic Metabolic Panel:  Recent Labs Lab 12/10/13 1723 12/10/13 1747 12/11/13 1005  NA 140 140 145  K 4.2 3.7 3.6*  CL 97 103 105  CO2 20  --  25  GLUCOSE 110* 110* 144*  BUN 18 17 13   CREATININE 1.20* 1.30* 0.98  CALCIUM 9.6  --  9.0   Liver Function Tests:  Recent Labs Lab 12/10/13 1723 12/11/13 1005  AST 17 13  ALT 12  10  ALKPHOS 55 50  BILITOT 0.3 0.3  PROT 7.7 6.8  ALBUMIN 3.6 3.1*   No results found for this basename: LIPASE, AMYLASE,  in the last 168 hours No results found for this basename: AMMONIA,  in the last 168 hours CBC:  Recent Labs Lab 12/10/13 1723 12/10/13 1747  WBC 8.1  --   NEUTROABS 4.9  --   HGB 9.6* 10.5*  HCT 28.6* 31.0*  MCV 88.0  --   PLT 291  --    Cardiac Enzymes: No results found for this basename: CKTOTAL, CKMB, CKMBINDEX, TROPONINI,  in the last 168 hours BNP (last 3 results)  Recent Labs  05/22/13 1640 10/13/13 1305 10/16/13 0423  PROBNP 173.3 1431.0* 718.6*   CBG:  Recent Labs Lab 12/11/13 1137 12/11/13 1617 12/11/13 2124 12/11/13 2159 12/12/13 0700  GLUCAP 163* 289* 65* 113* 139*    No results found for this or any previous visit (from the past 240 hour(s)).   Studies: Ct Head (brain) Wo Contrast  12/10/2013   CLINICAL DATA:  Tremors  EXAM: CT HEAD WITHOUT CONTRAST  TECHNIQUE: Contiguous axial images were obtained from the base of the skull through the vertex without intravenous contrast. Study was obtained within 24 hr of patient's arrival at the emergency  department.  COMPARISON:  October 05, 2013 and May 01, 2009  FINDINGS: There is moderate diffuse atrophy. There is no appreciable mass, hemorrhage, extra-axial fluid collection, or midline shift. There is evidence of a prior infarct in the mid left frontal lobe. There is evidence of a prior small infarct in the right thalamus. There is a prior small infarct in the left pons. There is patchy small vessel disease in the centra semiovale bilaterally. There is no new gray-white compartment lesion. No acute infarct is appreciable.  An area of relative lucency in the left frontal bone is stable compared to 2010. Bony calvarium appears stable. Mastoid air cells are clear. There are areas of mild mucosal thickening in both maxillary antra.  IMPRESSION: Atrophy with small vessel disease. Prior small  infarcts. No acute appearing infarct. No hemorrhage or mass effect. Mild maxillary sinus disease bilaterally.  Critical Value/emergent results were called by telephone at the time of interpretation on 12/10/2013 at 5:40 PM to Dr. Nicole Kindred, neruology , who verbally acknowledged these results.   Electronically Signed   By: Lowella Grip M.D.   On: 12/10/2013 17:41   Dg Abd Portable 1v  12/11/2013   CLINICAL DATA:  Nausea, history diabetes, hypertension, hiatal hernia  EXAM: PORTABLE ABDOMEN - 1 VIEW  COMPARISON:  Portable exam 1020 hr compared to 10/05/2013  FINDINGS: Normal bowel gas pattern.  No bowel dilatation or bowel wall thickening.  Osseous demineralization with levoconvex thoracolumbar scoliosis.  Scattered atherosclerotic calcifications aorta.  Left pelvic phlebolith.  No definite urinary tract calcification.  IMPRESSION: Nonobstructive bowel gas pattern.   Electronically Signed   By: Lavonia Dana M.D.   On: 12/11/2013 11:43    Scheduled Meds: . amLODipine  10 mg Oral q morning - 10a  . atorvastatin  10 mg Oral q morning - 10a  . clopidogrel  75 mg Oral Q breakfast  . docusate sodium  100 mg Oral BID  . ferrous sulfate  325 mg Oral TID PC  . furosemide  20 mg Oral Daily  . heparin  5,000 Units Subcutaneous 3 times per day  . insulin aspart  0-5 Units Subcutaneous QHS  . insulin aspart  0-9 Units Subcutaneous TID WC  . levETIRAcetam  500 mg Oral BID  . loratadine  10 mg Oral Daily  . LORazepam  1 mg Intravenous Once  . Memantine HCl ER  1 capsule Oral q morning - 10a  . morphine      . OLANZapine  2.5 mg Oral QHS  . pantoprazole  80 mg Oral Daily  . tamsulosin  0.4 mg Oral q morning - 10a   Continuous Infusions:   Principal Problem:   Complex partial seizure Active Problems:   HYPERLIPIDEMIA   HYPERTENSION   Type 2 diabetes, uncontrolled, with neuropathy   TIA (transient ischemic attack)   Dementia   Acute diastolic CHF (congestive heart failure)   Protein-calorie  malnutrition, severe   Complex partial seizures   Seizure  Time spent: 71min  [name], [facility] Pager [phone]. If 7PM-7AM, please contact night-coverage at [email or web address], password [facility] 12/12/2013, 10:58 AM  LOS: 2 days

## 2013-12-13 ENCOUNTER — Other Ambulatory Visit: Payer: Self-pay

## 2013-12-13 ENCOUNTER — Telehealth: Payer: Self-pay | Admitting: Neurology

## 2013-12-13 DIAGNOSIS — R259 Unspecified abnormal involuntary movements: Secondary | ICD-10-CM

## 2013-12-13 DIAGNOSIS — K219 Gastro-esophageal reflux disease without esophagitis: Secondary | ICD-10-CM

## 2013-12-13 DIAGNOSIS — F329 Major depressive disorder, single episode, unspecified: Secondary | ICD-10-CM

## 2013-12-13 DIAGNOSIS — E785 Hyperlipidemia, unspecified: Secondary | ICD-10-CM

## 2013-12-13 DIAGNOSIS — Z8673 Personal history of transient ischemic attack (TIA), and cerebral infarction without residual deficits: Secondary | ICD-10-CM

## 2013-12-13 DIAGNOSIS — I10 Essential (primary) hypertension: Secondary | ICD-10-CM

## 2013-12-13 DIAGNOSIS — R5383 Other fatigue: Secondary | ICD-10-CM

## 2013-12-13 DIAGNOSIS — R5381 Other malaise: Secondary | ICD-10-CM

## 2013-12-13 DIAGNOSIS — F3289 Other specified depressive episodes: Secondary | ICD-10-CM

## 2013-12-13 DIAGNOSIS — E43 Unspecified severe protein-calorie malnutrition: Secondary | ICD-10-CM

## 2013-12-13 LAB — CK TOTAL AND CKMB (NOT AT ARMC)
CK TOTAL: 57 U/L (ref 7–177)
CK, MB: <1 ng/mL (ref 0.3–4.0)

## 2013-12-13 LAB — GLUCOSE, CAPILLARY
Glucose-Capillary: 138 mg/dL — ABNORMAL HIGH (ref 70–99)
Glucose-Capillary: 100 mg/dL — ABNORMAL HIGH (ref 70–99)
Glucose-Capillary: 352 mg/dL — ABNORMAL HIGH (ref 70–99)

## 2013-12-13 LAB — TROPONIN I

## 2013-12-13 MED ORDER — CARBIDOPA-LEVODOPA ER 25-100 MG PO TBCR
1.0000 | EXTENDED_RELEASE_TABLET | Freq: Once | ORAL | Status: AC
Start: 2013-12-13 — End: 2013-12-13
  Administered 2013-12-13: 1 via ORAL
  Filled 2013-12-13: qty 1

## 2013-12-13 NOTE — Evaluation (Signed)
Physical Therapy Evaluation Patient Details Name: [name] MRN: [id number] DOB: Jan 10, 1931 Today's Date: 12/13/2013   History of Present Illness  History: [name] is an 80 y.o. female who's been experiencing recurrent spells of becoming flushing and complaining of headache with associated staring and reduced responsiveness as well as tremulousness of extremities and confusion.  Clinical Impression  Patient demonstrates deficits as indicated below. Patient will need continued PT to address deficits and maximize function. Recommend ST SNF upon acute discharge for safety with mobility and to address deconditioning.      Follow Up Recommendations SNF    Equipment Recommendations   (TBD)    Recommendations for Other Services       Precautions / Restrictions Restrictions Weight Bearing Restrictions: No      Mobility  Bed Mobility Overal bed mobility: Needs Assistance Bed Mobility: Supine to Sit     Supine to sit: Min assist     General bed mobility comments: VCs for positioing, assist to come to EOB  Transfers Overall transfer level: Needs assistance Equipment used: Rolling walker (2 wheeled);2 person hand held assist Transfers: Sit to/from Stand Sit to Stand: Min guard;Min assist         General transfer comment: min assist to come to standing, assist for stability, VCs for hand placement and safety returning to sitting position in chair when using RW  Ambulation/Gait Ambulation/Gait assistance: Min assist Ambulation Distance (Feet): 120 Feet (18 ft with 2 person hand held (min to mod assist)) Assistive device: Rolling walker (2 wheeled);2 person hand held assist Gait Pattern/deviations: Step-through pattern;Decreased stride length;Shuffle;Festinating;Drifts right/left;Trunk flexed Gait velocity: decreased Gait velocity interpretation: <1.8 ft/sec, indicative of risk for recurrent falls General Gait Details: max cues for increased stride and cadence,  patient with improvements modestly in tremors when using RW for fixated grip.  Patient did desaturate with ambulation and activity to 91% on room air. Will monitor  Stairs            Wheelchair Mobility    Modified Rankin (Stroke Patients Only)       Balance Overall balance assessment: Needs assistance         Standing balance support: During functional activity Standing balance-Leahy Scale: Fair Standing balance comment: when un supported UE patient with increased instability                             Pertinent Vitals/Pain Patient desaturating to 91% on room air with activity, significant tremors noted throughout session    [place] expects to be discharged to:: Private residence Living Arrangements: Children Available Help at Discharge: Family Type of Home: House Home Access: Stairs to enter Entrance Stairs-Rails: Left Entrance Stairs-Number of Steps: 7 Home Layout: Two level;Able to live on main level with bedroom/bathroom Home Equipment: Gilford Rile - 2 wheels;Cane - single point Additional Comments: walkin in with seat, standard height toil;ets    Prior Function Level of Independence: Independent         Comments: uses cane outside of home. nothing inside home, per patient     Hand Dominance   Dominant Hand: Right    Extremity/Trunk Assessment   Upper Extremity Assessment: Defer to OT evaluation           Lower Extremity Assessment: Generalized weakness (RLE slightly weaker than left)         Communication   Communication: Expressive difficulties (difficulty speaking with fatigue and tremors)  Cognition Arousal/Alertness:  Awake/alert Behavior During Therapy: Anxious Overall Cognitive Status: Within Functional Limits for tasks assessed                      General Comments General comments (skin integrity, edema, etc.): performed muscle relaxation exercised with patient. Patient demonstrates increased  tightness in upper/middle traps secondary to continuous tremors of head, neck, UEs.  Patient able to perform active scapular retraction and reports comfort with scapular protraction stretch.  Also provided cues for positioning and techniques to assist with patient comfort.    Exercises        Assessment/Plan    PT Assessment Patient needs continued PT services  PT Diagnosis Difficulty walking;Abnormality of gait;Generalized weakness   PT Problem List Decreased strength;Decreased activity tolerance;Decreased balance;Decreased mobility;Decreased coordination  PT Treatment Interventions DME instruction;Gait training;Stair training;Functional mobility training;Therapeutic activities;Therapeutic exercise;Balance training;Patient/family education   PT Goals (Current goals can be found in the Care Plan section) Acute Rehab PT Goals Patient Stated Goal: to know what is causing this PT Goal Formulation: With patient/family Time For Goal Achievement: 12/27/13 Potential to Achieve Goals: Good    Frequency Min 2X/week   Barriers to discharge Inaccessible home environment 7 steps to enter the home    Co-evaluation               End of Session Equipment Utilized During Treatment: Gait belt Activity Tolerance: Patient limited by fatigue Patient left: in chair;with call bell/phone within reach;with family/visitor present Nurse Communication: Mobility status         Time: 9628-3662 PT Time Calculation (min): 27 min   Charges:   PT Evaluation $Initial PT Evaluation Tier I: 1 Procedure PT Treatments $Gait Training: 8-22 mins $Therapeutic Activity: 8-22 mins   PT G Codes:          [name] 12/13/2013, 11:32 AM Alben Deeds, PT DPT  [phone]

## 2013-12-13 NOTE — Telephone Encounter (Signed)
Patient's daughter in law called to cancel patient's appointment on 12/14/13 due to the patient being in the hospital. Patient's daughter in law wants to reschedule, but did not want to wait for first available. Please call patient's daughter and advise.

## 2013-12-13 NOTE — Progress Notes (Signed)
Chaplain gave support to pt by facilitating the finalization of an Advance Directive.  Chaplain scheduled notary to come to pt's room, recruited 2 witnesses, made 4 copies of AD, gave 1 to pt's nurse and gave the other 4 to pt's son, at the request of pt.  Pt and her son expressed great appreciation to chaplain, notary and 2 witnesses.   12/13/13 1500  Clinical Encounter Type  Visited With Patient and family together  Visit Type Spiritual support  Referral From [place], chaplain [phone]

## 2013-12-13 NOTE — Progress Notes (Signed)
Subjective: Patient feels her tremor is slightly better today.  Currently having visual hallucinations of three men in her room but not upset and comfortable with them being in the room.  Has had multiple episodes when her tremors increase and she is unable to speak but is able to follow commands.  Seems uncomfortable with these episodes.    Objective: Current vital signs: BP 116/75  Pulse 86  Temp(Src) 98.1 F (36.7 C) (Oral)  Resp 18  Ht 5\' 3"  (1.6 m)  Wt 67.45 kg (148 lb 11.2 oz)  BMI 26.35 kg/m2  SpO2 100% Vital signs in last 24 hours: Temp:  [97.7 F (36.5 C)-98.7 F (37.1 C)] 98.1 F (36.7 C) (04/08 1040) Pulse Rate:  [70-86] 86 (04/08 1040) Resp:  [16-18] 18 (04/08 1040) BP: (116-149)/(51-75) 116/75 mmHg (04/08 1040) SpO2:  [96 %-100 %] 100 % (04/08 1040)  Intake/Output from previous day:   Intake/Output this shift: Total I/O In: 120 [P.O.:120] Out: -  Nutritional status: Carb Control  Neurologic Exam: Mental Status: Alert, not oriented to place and time, currently seeing men in her room which are not there.  Speech fluent without evidence of aphasia.  Able to follow 3 step commands without difficulty. Cranial Nerves: II: Visual fields grossly normal, pupils equal, round, reactive to light and accommodation III,IV, VI: ptosis not present, extra-ocular motions intact bilaterally V,VII: smile symmetric, facial light touch sensation normal bilaterally VIII: hearing normal bilaterally IX,X: gag reflex present XI: bilateral shoulder shrug XII: midline tongue extension --oral buccal tremor noted as previous exam  Motor: Right : Upper extremity   5/5    Left:     Upper extremity   5/5  Lower extremity   5/5     Lower extremity   5/5 --bilateral UE tremor which is both resting and postural  Tone and bulk:normal tone throughout; no atrophy noted.  Repetitive pursing of the lips. Sensory: Pinprick and light touch intact throughout, bilaterally Deep Tendon Reflexes:   1+ bilateral UE and KJ no AJ.   Plantars: Mute bilaterally Cerebellar: normal finger-to-nose,  normal heel-to-shin test    Lab Results: Basic Metabolic Panel:  Recent Labs Lab 12/10/13 1723 12/10/13 1747 12/11/13 1005  NA 140 140 145  K 4.2 3.7 3.6*  CL 97 103 105  CO2 20  --  25  GLUCOSE 110* 110* 144*  BUN 18 17 13   CREATININE 1.20* 1.30* 0.98  CALCIUM 9.6  --  9.0    Liver Function Tests:  Recent Labs Lab 12/10/13 1723 12/11/13 1005  AST 17 13  ALT 12 10  ALKPHOS 55 50  BILITOT 0.3 0.3  PROT 7.7 6.8  ALBUMIN 3.6 3.1*   No results found for this basename: LIPASE, AMYLASE,  in the last 168 hours No results found for this basename: AMMONIA,  in the last 168 hours  CBC:  Recent Labs Lab 12/10/13 1723 12/10/13 1747  WBC 8.1  --   NEUTROABS 4.9  --   HGB 9.6* 10.5*  HCT 28.6* 31.0*  MCV 88.0  --   PLT 291  --     Cardiac Enzymes: No results found for this basename: CKTOTAL, CKMB, CKMBINDEX, TROPONINI,  in the last 168 hours  Lipid Panel: No results found for this basename: CHOL, TRIG, HDL, CHOLHDL, VLDL, LDLCALC,  in the last 168 hours  CBG:  Recent Labs Lab 12/12/13 0700 12/12/13 1129 12/12/13 1635 12/12/13 2156 12/13/13 0634  GLUCAP 139* 232* 200* 143* 138*    Microbiology: Results  for orders placed during the hospital encounter of 10/05/13  CLOSTRIDIUM DIFFICILE BY PCR     Status: None   Collection Time    10/05/13  7:48 PM      Result Value Ref Range Status   C difficile by pcr NEGATIVE  NEGATIVE Final   Comment: Performed at [facility]     Status: None   Collection Time    10/05/13  9:11 PM      Result Value Ref Range Status   Specimen Description URINE, CATHETERIZED   Final   Special Requests NONE   Final   Culture  Setup Time     Final   Value: 10/06/2013 10:56     Performed at [facility]     Final   Value: NO GROWTH     Performed at [facility]   Culture      Final   Value: NO GROWTH     Performed at [facility]   Report Status 10/07/2013 FINAL   Final  CULTURE, BLOOD (ROUTINE X 2)     Status: None   Collection Time    10/07/13  5:03 PM      Result Value Ref Range Status   Specimen Description BLOOD LEFT ARM   Final   Special Requests BOTTLES DRAWN AEROBIC AND ANAEROBIC 10CC   Final   Culture  Setup Time     Final   Value: 10/07/2013 20:20     Performed at [facility]   Culture     Final   Value: NO GROWTH 5 DAYS     Performed at [facility]   Report Status 10/13/2013 FINAL   Final  CULTURE, BLOOD (ROUTINE X 2)     Status: None   Collection Time    10/07/13  5:16 PM      Result Value Ref Range Status   Specimen Description BLOOD RIGHT ARM   Final   Special Requests BOTTLES DRAWN AEROBIC AND ANAEROBIC 10CC   Final   Culture  Setup Time     Final   Value: 10/07/2013 20:20     Performed at [facility]   Culture     Final   Value: NO GROWTH 5 DAYS     Performed at [facility]   Report Status 10/13/2013 FINAL   Final    Coagulation Studies:  Recent Labs  12/10/13 1723  LABPROT 13.0  INR 1.00    Imaging: Mr Jeri Cos IR Contrast  12/12/2013   CLINICAL DATA:  New complex partial seizure.  EXAM: MRI HEAD WITHOUT AND WITH CONTRAST  TECHNIQUE: Multiplanar, multiecho pulse sequences of the brain and surrounding structures were obtained without and with intravenous contrast.  CONTRAST:  77mL MULTIHANCE GADOBENATE DIMEGLUMINE 529 MG/ML IV SOLN  COMPARISON:  Head CT 12/10/2013 and MRI 05/22/2013  FINDINGS: There is no evidence of acute infarct. Left frontal encephalomalacia is consistent with remote cortical infarct. There is a very small amount of increased diffusion weighted signal along the margins of the infarct without definite restricted diffusion on the ADC map. There is a small amount of enhancement along the margins of the infarct.  There is moderate cerebral atrophy. Small foci of T2  hyperintensity within the periventricular white matter bilaterally are nonspecific but compatible with mild chronic small vessel ischemic disease and not significantly changed from the prior MRI. There is symmetric hippocampal atrophy bilaterally. Hippocampi are symmetric  in signal. Remote lacunar infarcts are again seen in the left aspect of the pons and right greater than left thalami.  Prior bilateral cataract surgery is noted. Mild bilateral maxillary sinus mucosal thickening is present. Mastoid air cells are clear. Major intracranial vascular flow voids are preserved.  IMPRESSION: 1. Old left frontal infarct with a small amount of superimposed subacute ischemia. 2. No intracranial mass lesion. 3. Unchanged, mild chronic small vessel ischemic disease and cerebral atrophy.   Electronically Signed   By: Logan Bores   On: 12/12/2013 14:03    Medications:  Scheduled: . amLODipine  10 mg Oral q morning - 10a  . atorvastatin  10 mg Oral q morning - 10a  . Carbidopa-Levodopa ER  1 tablet Oral Once  . clopidogrel  75 mg Oral Q breakfast  . docusate sodium  100 mg Oral BID  . ferrous sulfate  325 mg Oral TID PC  . furosemide  20 mg Oral Daily  . heparin  5,000 Units Subcutaneous 3 times per day  . insulin aspart  0-5 Units Subcutaneous QHS  . insulin aspart  0-9 Units Subcutaneous TID WC  . levETIRAcetam  500 mg Oral BID  . loratadine  10 mg Oral Daily  . LORazepam  1 mg Intravenous Once  . Memantine HCl ER  1 capsule Oral q morning - 10a  . OLANZapine  2.5 mg Oral QHS  . pantoprazole  80 mg Oral Daily  . tamsulosin  0.4 mg Oral q morning - 10a    Assessment/Plan: At this point the differential for the patient's movements are broad.  She does have a right frontal lesion that has some surrounding hyperdensity of review.  This may be a consequence of seizure activity and although the episodes are unusual for seizure, seizures emanating from the frontal lobe can have an unusual presentation.  A  parkinsonian syndrome is possible as well complicated by anxiety.  Am also concerned about a medication side effect.    Recommendations: 1.  Patient to have a trial of Sinemet today.  Family to be present to evaluate recurrence of episodes.   2.  Increase Keppra to 750mg  BID 3.  Will consider need for discontinuation of Olanzapine.    Conversation had at length with family.     LOS: 3 days   Alexis Goodell, MD [facility] [phone]  12/13/2013  11:42 AM

## 2013-12-13 NOTE — Progress Notes (Signed)
[facility]                                                                              Patient Demographics  [name], is a 80 y.o. female, DOB - 06-16-1931, RUE:[id number]  Admit date - 12/10/2013   Admitting Physician Orvan Falconer, MD  Outpatient Primary MD for the patient is Cathlean Cower, MD  LOS - 3   Chief Complaint  Patient presents with  . Code Stroke        Assessment & Plan   Tremors -Facial and hand -Neurology consulted and following -MRI of the brain: Old left frontal infarct with a small amount of superimposed acute ischemia -Continue empiric 500 mg twice a day per neurology recommendation -EEG: Findings of involuntary muscle tremors  Hypertension -Stable, continue amlodipine, Lasix  Diabetes mellitus -Continue insulin sliding scale and CBG monitoring  Dementia -Continue memantine  Depression/anxiety Continue Zyprexa and Ativan  Code Status: DNR  Family Communication: None at bedside   Disposition Plan: Admitted  Time Spent in minutes   30 minutes  Procedures  JYN:WGNF is a normal EEG recording during wakefulness. No evidence of an epileptic disorder is demonstrated. Muscle artifact occurring with irregular frequency of 3 Hz is consistent with tremor, likely involuntary.  Consults  neurology  DVT Prophylaxis  Heparin  Lab Results  Component Value Date   PLT 291 12/10/2013    Medications  Scheduled Meds: . amLODipine  10 mg Oral q morning - 10a  . atorvastatin  10 mg Oral q morning - 10a  . clopidogrel  75 mg Oral Q breakfast  . docusate sodium  100 mg Oral BID  . ferrous sulfate  325 mg Oral TID PC  . furosemide  20 mg Oral Daily  . heparin  5,000 Units Subcutaneous 3 times per day  . insulin aspart  0-5 Units Subcutaneous QHS  . insulin aspart  0-9 Units Subcutaneous TID WC  . levETIRAcetam  500 mg Oral BID  . loratadine  10 mg Oral Daily  . LORazepam  1 mg Intravenous Once  . Memantine HCl ER  1 capsule Oral q morning -  10a  . OLANZapine  2.5 mg Oral QHS  . pantoprazole  80 mg Oral Daily  . tamsulosin  0.4 mg Oral q morning - 10a   Continuous Infusions:  PRN Meds:.clonazePAM, ondansetron (ZOFRAN) IV  Antibiotics    Anti-infectives   None      Subjective:   [facility] seen and examined today.  Patient continues to have facial movement however states her hand shakiness has improved. Patient wonders if this will subside. Denies chest pain, dizziness, shortness of breath.   Objective:   Filed Vitals:   12/12/13 1826 12/12/13 2112 12/13/13 0200 12/13/13 0529  BP: 149/56 118/51 144/52 145/52  Pulse: 81 74 74 70  Temp: 98.1 F (36.7 C) 98.5 F (36.9 C) 98 F (36.7 C) 97.7 F (36.5 C)  TempSrc: Oral Oral Oral Oral  Resp: 18 18 18 18   Height:      Weight:      SpO2: 100% 96% 97% 97%    Wt Readings from Last 3 Encounters:  12/10/13 67.45 kg (148  lb 11.2 oz)  10/17/13 60.2 kg (132 lb 11.5 oz)  10/05/13 63.685 kg (140 lb 6.4 oz)    No intake or output data in the 24 hours ending 12/13/13 1610  Exam  General: Well developed, well nourished, NAD, appears stated age  HEENT: NCAT, PERRLA, EOMI, Anicteic Sclera, mucous membranes moist. Facial tremor  Neck: Supple, no JVD, no masses  Cardiovascular: S1 S2 auscultated, Regular rate and rhythm.  Respiratory: Clear to auscultation bilaterally with equal chest rise  Abdomen: Soft, nontender, nondistended, + bowel sounds  Extremities: warm dry without cyanosis clubbing or edema  Neuro: AAOx3, cranial nerves grossly intact. No focal deficits.  Skin: Without rashes exudates or nodules  Psych: Normal affect and demeanor with intact judgement and insight   Data Review   Micro Results No results found for this or any previous visit (from the past 240 hour(s)).  Radiology Reports Ct Head (brain) Wo Contrast  12/10/2013   CLINICAL DATA:  Tremors  EXAM: CT HEAD WITHOUT CONTRAST  TECHNIQUE: Contiguous axial images were obtained from  the base of the skull through the vertex without intravenous contrast. Study was obtained within 24 hr of patient's arrival at the emergency department.  COMPARISON:  October 05, 2013 and May 01, 2009  FINDINGS: There is moderate diffuse atrophy. There is no appreciable mass, hemorrhage, extra-axial fluid collection, or midline shift. There is evidence of a prior infarct in the mid left frontal lobe. There is evidence of a prior small infarct in the right thalamus. There is a prior small infarct in the left pons. There is patchy small vessel disease in the centra semiovale bilaterally. There is no new gray-white compartment lesion. No acute infarct is appreciable.  An area of relative lucency in the left frontal bone is stable compared to 2010. Bony calvarium appears stable. Mastoid air cells are clear. There are areas of mild mucosal thickening in both maxillary antra.  IMPRESSION: Atrophy with small vessel disease. Prior small infarcts. No acute appearing infarct. No hemorrhage or mass effect. Mild maxillary sinus disease bilaterally.  Critical Value/emergent results were called by telephone at the time of interpretation on 12/10/2013 at 5:40 PM to Dr. Nicole Kindred, neruology , who verbally acknowledged these results.   Electronically Signed   By: Lowella Grip M.D.   On: 12/10/2013 17:41   Mr Jeri Cos RU Contrast  12/12/2013   CLINICAL DATA:  New complex partial seizure.  EXAM: MRI HEAD WITHOUT AND WITH CONTRAST  TECHNIQUE: Multiplanar, multiecho pulse sequences of the brain and surrounding structures were obtained without and with intravenous contrast.  CONTRAST:  1mL MULTIHANCE GADOBENATE DIMEGLUMINE 529 MG/ML IV SOLN  COMPARISON:  Head CT 12/10/2013 and MRI 05/22/2013  FINDINGS: There is no evidence of acute infarct. Left frontal encephalomalacia is consistent with remote cortical infarct. There is a very small amount of increased diffusion weighted signal along the margins of the infarct without definite  restricted diffusion on the ADC map. There is a small amount of enhancement along the margins of the infarct.  There is moderate cerebral atrophy. Small foci of T2 hyperintensity within the periventricular white matter bilaterally are nonspecific but compatible with mild chronic small vessel ischemic disease and not significantly changed from the prior MRI. There is symmetric hippocampal atrophy bilaterally. Hippocampi are symmetric in signal. Remote lacunar infarcts are again seen in the left aspect of the pons and right greater than left thalami.  Prior bilateral cataract surgery is noted. Mild bilateral maxillary sinus mucosal thickening is present.  Mastoid air cells are clear. Major intracranial vascular flow voids are preserved.  IMPRESSION: 1. Old left frontal infarct with a small amount of superimposed subacute ischemia. 2. No intracranial mass lesion. 3. Unchanged, mild chronic small vessel ischemic disease and cerebral atrophy.   Electronically Signed   By: Logan Bores   On: 12/12/2013 14:03   Dg Abd Portable 1v  12/11/2013   CLINICAL DATA:  Nausea, history diabetes, hypertension, hiatal hernia  EXAM: PORTABLE ABDOMEN - 1 VIEW  COMPARISON:  Portable exam 1020 hr compared to 10/05/2013  FINDINGS: Normal bowel gas pattern.  No bowel dilatation or bowel wall thickening.  Osseous demineralization with levoconvex thoracolumbar scoliosis.  Scattered atherosclerotic calcifications aorta.  Left pelvic phlebolith.  No definite urinary tract calcification.  IMPRESSION: Nonobstructive bowel gas pattern.   Electronically Signed   By: Lavonia Dana M.D.   On: 12/11/2013 11:43    CBC  Recent Labs Lab 12/10/13 1723 12/10/13 1747  WBC 8.1  --   HGB 9.6* 10.5*  HCT 28.6* 31.0*  PLT 291  --   MCV 88.0  --   MCH 29.5  --   MCHC 33.6  --   RDW 14.4  --   LYMPHSABS 2.3  --   MONOABS 0.6  --   EOSABS 0.2  --   BASOSABS 0.0  --     Chemistries   Recent Labs Lab 12/10/13 1723 12/10/13 1747  12/11/13 1005  NA 140 140 145  K 4.2 3.7 3.6*  CL 97 103 105  CO2 20  --  25  GLUCOSE 110* 110* 144*  BUN 18 17 13   CREATININE 1.20* 1.30* 0.98  CALCIUM 9.6  --  9.0  AST 17  --  13  ALT 12  --  10  ALKPHOS 55  --  50  BILITOT 0.3  --  0.3   ------------------------------------------------------------------------------------------------------------------ estimated creatinine clearance is 42.2 ml/min (by C-G formula based on Cr of 0.98). ------------------------------------------------------------------------------------------------------------------ No results found for this basename: HGBA1C,  in the last 72 hours ------------------------------------------------------------------------------------------------------------------ No results found for this basename: CHOL, HDL, LDLCALC, TRIG, CHOLHDL, LDLDIRECT,  in the last 72 hours ------------------------------------------------------------------------------------------------------------------ No results found for this basename: TSH, T4TOTAL, FREET3, T3FREE, THYROIDAB,  in the last 72 hours ------------------------------------------------------------------------------------------------------------------ No results found for this basename: VITAMINB12, FOLATE, FERRITIN, TIBC, IRON, RETICCTPCT,  in the last 72 hours  Coagulation profile  Recent Labs Lab 12/10/13 1723  INR 1.00    No results found for this basename: DDIMER,  in the last 72 hours  Cardiac Enzymes No results found for this basename: CK, CKMB, TROPONINI, MYOGLOBIN,  in the last 168 hours ------------------------------------------------------------------------------------------------------------------ No components found with this basename: POCBNP,     Theoplis Garciagarcia D.O. on 12/13/2013 at 8:22 AM  Between 7am to 7pm - Pager - [phone]  After 7pm go to [email or web address] - password [email or web address]  And look for the night coverage person covering for me after hours  [facility] Office  [phone]

## 2013-12-13 NOTE — Progress Notes (Signed)
Writer called to patient's room and noted patient sitting up in geri chair with shaking to upper extremities and not able to speak. Patient can follow command as she squeezed writer's hand when asked. V?S taken and recorded. Dr. Ree Kida notified and orders given which are carried out. Patient in bed at this time with family at side.

## 2013-12-13 NOTE — Progress Notes (Addendum)
OT Cancellation Note  Patient Details Name: [name] MRN: [id number] DOB: 1931/04/03   Cancelled Treatment:    Reason Eval/Treat Not Completed: OT screened. Spoke with PT and plan is SNF for d/c. Pt is Medicare and current D/C plan is SNF. No apparent immediate acute care OT needs, therefore will defer OT to SNF. If OT eval is needed please call Acute Rehab Dept. at [phone] or text page OT at [phone].     Benito Mccreedy OTR/L [phone] 12/13/2013, 2:02 PM

## 2013-12-13 NOTE — Telephone Encounter (Signed)
Called pt and spoke with pt's daughter Jenny Reichmann to resch pt's appt with Dalbert Mayotte, NP on 12/28/13. I advised the pt's daughter that if the pt has any other problems, questions or concerns to call the office. Pt's daughter verbalized understanding.

## 2013-12-14 ENCOUNTER — Ambulatory Visit: Payer: Medicare Other | Admitting: Nurse Practitioner

## 2013-12-14 DIAGNOSIS — I509 Heart failure, unspecified: Secondary | ICD-10-CM

## 2013-12-14 DIAGNOSIS — F411 Generalized anxiety disorder: Secondary | ICD-10-CM

## 2013-12-14 DIAGNOSIS — I5031 Acute diastolic (congestive) heart failure: Secondary | ICD-10-CM

## 2013-12-14 LAB — GLUCOSE, CAPILLARY
GLUCOSE-CAPILLARY: 205 mg/dL — AB (ref 70–99)
GLUCOSE-CAPILLARY: 181 mg/dL — AB (ref 70–99)
Glucose-Capillary: 178 mg/dL — ABNORMAL HIGH (ref 70–99)

## 2013-12-14 MED ORDER — CLONAZEPAM 0.5 MG PO TABS: 0.2500 mg | ORAL_TABLET | Freq: Two times a day (BID) | ORAL | Status: DC

## 2013-12-14 MED ORDER — CARBIDOPA-LEVODOPA ER 25-100 MG PO TBCR
1.0000 | EXTENDED_RELEASE_TABLET | Freq: Once | ORAL | Status: AC
Start: 2013-12-14 — End: 2013-12-14
  Administered 2013-12-14: 1 via ORAL
  Filled 2013-12-14 (×2): qty 1

## 2013-12-14 MED ORDER — LEVETIRACETAM 500 MG PO TABS: 500.0000 mg | ORAL_TABLET | Freq: Two times a day (BID) | ORAL | Status: DC

## 2013-12-14 MED ORDER — ALUM & MAG HYDROXIDE-SIMETH 200-200-20 MG/5ML PO SUSP
30.0000 mL | ORAL | Status: DC | PRN
  Administered 2013-12-14: 30 mL via ORAL
  Filled 2013-12-14: qty 30

## 2013-12-14 NOTE — Discharge Instructions (Signed)
Tremor  Tremor is a rhythmic, involuntary muscular contraction characterized by oscillations (to-and-fro movements) of a part of the body. The most common of all involuntary movements, tremor can affect various body parts such as the hands, head, facial structures, vocal cords, trunk, and legs; most tremors, however, occur in the hands. Tremor often accompanies neurological disorders associated with aging. Although the disorder is not life-threatening, it can be responsible for functional disability and social embarrassment.  TREATMENT   There are many types of tremor and several ways in which tremor is classified. The most common classification is by behavioral context or position. There are five categories of tremor within this classification: resting, postural, kinetic, task-specific, and psychogenic. Resting or static tremor occurs when the muscle is at rest, for example when the hands are lying on the lap. This type of tremor is often seen in patients with Parkinson's disease. Postural tremor occurs when a patient attempts to maintain posture, such as holding the hands outstretched. Postural tremors include physiological tremor, essential tremor, tremor with basal ganglia disease (also seen in patients with Parkinson's disease), cerebellar postural tremor, tremor with peripheral neuropathy, post-traumatic tremor, and alcoholic tremor. Kinetic or intention (action) tremor occurs during purposeful movement, for example during finger-to-nose testing. Task-specific tremor appears when performing goal-oriented tasks such as handwriting, speaking, or standing. This group consists of primary writing tremor, vocal tremor, and orthostatic tremor. Psychogenic tremor occurs in both older and younger patients. The key feature of this tremor is that it dramatically lessens or disappears when the patient is distracted.  PROGNOSIS  There are some treatment options available for tremor; the appropriate treatment depends on  accurate diagnosis of the cause. Some tremors respond to treatment of the underlying condition, for example in some cases of psychogenic tremor treating the patient's underlying mental problem may cause the tremor to disappear. Also, patients with tremor due to Parkinson's disease may be treated with Levodopa drug therapy. Symptomatic drug therapy is available for several other tremors as well. For those cases of tremor in which there is no effective drug treatment, physical measures such as teaching the patient to brace the affected limb during the tremor are sometimes useful. Surgical intervention such as thalamotomy or deep brain stimulation may be useful in certain cases.  Document Released: 08/14/2002 Document Revised: 11/16/2011 Document Reviewed: 08/24/2005  ExitCare® Patient Information ©2014 [employer].

## 2013-12-14 NOTE — Clinical Social Work Psychosocial (Signed)
Clinical Social Work Department BRIEF PSYCHOSOCIAL ASSESSMENT 12/14/2013  Patient:  KIERSTYNN, BABICH     Account Number:  [id number]     Admit date:  12/10/2013  Clinical Social Worker:  Donna Christen  Date/Time:  12/14/2013 10:45 AM  Referred by:  Physician  Date Referred:  12/14/2013 Referred for  SNF Placement   Other Referral:   none.   Interview type:  Family Other interview type:   CSW spoke with pt's daughter, Azilee Pirro.    PSYCHOSOCIAL DATA Living Status:  FAMILY Admitted from facility:   Level of care:   Primary support name:  Gary/Cindy Spiegelman Primary support relationship to patient:  CHILD, ADULT Degree of support available:   Strong support system, pt lives with children.    CURRENT CONCERNS Current Concerns  Post-Acute Placement   Other Concerns:   none.    SOCIAL WORK ASSESSMENT / PLAN CSW received consult for SNF placement at time of discharge. CSW spoke with pt's daughter, Jenny Reichmann, regarding PT recommendation for SNF placement. Per Jenny Reichmann, pt has previously been placed at [facility] in [place] for short-term rehabilitation. Jenny Reichmann informed CSW that pt lives with her and her husband, but family believes pt will best benefit from SNF placement at [facility]. CSW to continue to follow and assist with discharge planning needs.   Assessment/plan status:  Psychosocial Support/Ongoing Assessment of Needs Other assessment/ plan:   none.   Information/referral to community resources:   Pt to be placed at [facility].    PATIENTS/FAMILYS RESPONSE TO PLAN OF CARE: Pt's family understanding and agreeable to CSW plan of care. Per Jenny Reichmann, family was pleased with pt's care while at [facility].       Pati Gallo, [place] Social Worker [phone]

## 2013-12-14 NOTE — Progress Notes (Signed)
Subjective: No complaints today other than frustration about why she has her abnormal movements.  No hallucinations today. Patient continues to have intermittent episodes where her movements are worse but than resolve.   Objective: Current vital signs: BP 156/60  Pulse 89  Temp(Src) 98.1 F (36.7 C) (Oral)  Resp 18  Ht 5\' 3"  (1.6 m)  Wt 67.45 kg (148 lb 11.2 oz)  BMI 26.35 kg/m2  SpO2 100% Vital signs in last 24 hours: Temp:  [97.5 F (36.4 C)-98.8 F (37.1 C)] 98.1 F (36.7 C) (04/09 1036) Pulse Rate:  [80-98] 89 (04/09 1036) Resp:  [18] 18 (04/09 1036) BP: (109-156)/(44-72) 156/60 mmHg (04/09 1036) SpO2:  [98 %-100 %] 100 % (04/09 1036)  Intake/Output from previous day: 04/08 0701 - 04/09 0700 In: 240 [P.O.:240] Out: -  Intake/Output this shift:   Nutritional status: Carb Control  Neurologic Exam: Mental Status:  Alert, not oriented to place and time. Speech fluent without evidence of aphasia. Able to follow 3 step commands without difficulty.  Cranial Nerves:  II: Visual fields grossly normal, pupils equal, round, reactive to light and accommodation  III,IV, VI: ptosis not present, extra-ocular motions intact bilaterally  V,VII: smile symmetric, facial light touch sensation normal bilaterally  VIII: hearing normal bilaterally  IX,X: gag reflex present  XI: bilateral shoulder shrug  XII: midline tongue extension  --oral buccal movements noted as previous exam not as pronounces today Motor:  5/5 strength in bilateral UE and LE  --bilateral UE tremor which is both resting and postural  Tone and bulk:normal tone throughout; no atrophy noted. Repetitive pursing of the lips.  Sensory: Pinprick and light touch intact throughout, bilaterally  Deep Tendon Reflexes:  1+ bilateral UE and KJ no AJ.  Plantars:  Mute bilaterally  Cerebellar:  normal finger-to-nose, normal heel-to-shin test      Lab Results: Basic Metabolic Panel:  Recent Labs Lab 12/10/13 1723  12/10/13 1747 12/11/13 1005  NA 140 140 145  K 4.2 3.7 3.6*  CL 97 103 105  CO2 20  --  25  GLUCOSE 110* 110* 144*  BUN 18 17 13   CREATININE 1.20* 1.30* 0.98  CALCIUM 9.6  --  9.0    Liver Function Tests:  Recent Labs Lab 12/10/13 1723 12/11/13 1005  AST 17 13  ALT 12 10  ALKPHOS 55 50  BILITOT 0.3 0.3  PROT 7.7 6.8  ALBUMIN 3.6 3.1*   No results found for this basename: LIPASE, AMYLASE,  in the last 168 hours No results found for this basename: AMMONIA,  in the last 168 hours  CBC:  Recent Labs Lab 12/10/13 1723 12/10/13 1747  WBC 8.1  --   NEUTROABS 4.9  --   HGB 9.6* 10.5*  HCT 28.6* 31.0*  MCV 88.0  --   PLT 291  --     Cardiac Enzymes:  Recent Labs Lab 12/13/13 1154  CKTOTAL 57  CKMB <1.0  TROPONINI <0.30    Lipid Panel: No results found for this basename: CHOL, TRIG, HDL, CHOLHDL, VLDL, LDLCALC,  in the last 168 hours  CBG:  Recent Labs Lab 12/13/13 0634 12/13/13 1148 12/13/13 1625 12/14/13 0233 12/14/13 0604  GLUCAP 138* 100* 352* 178* 205*    Microbiology: Results for orders placed during the hospital encounter of 10/05/13  CLOSTRIDIUM DIFFICILE BY PCR     Status: None   Collection Time    10/05/13  7:48 PM      Result Value Ref Range Status   C  difficile by pcr NEGATIVE  NEGATIVE Final   Comment: Performed at [place]     Status: None   Collection Time    10/05/13  9:11 PM      Result Value Ref Range Status   Specimen Description URINE, CATHETERIZED   Final   Special Requests NONE   Final   Culture  Setup Time     Final   Value: 10/06/2013 10:56     Performed at [place]     Final   Value: NO GROWTH     Performed at [facility]   Culture     Final   Value: NO GROWTH     Performed at [facility]   Report Status 10/07/2013 FINAL   Final  CULTURE, BLOOD (ROUTINE X 2)     Status: None   Collection Time    10/07/13  5:03 PM      Result Value Ref  Range Status   Specimen Description BLOOD LEFT ARM   Final   Special Requests BOTTLES DRAWN AEROBIC AND ANAEROBIC 10CC   Final   Culture  Setup Time     Final   Value: 10/07/2013 20:20     Performed at [facility]   Culture     Final   Value: NO GROWTH 5 DAYS     Performed at [facility]   Report Status 10/13/2013 FINAL   Final  CULTURE, BLOOD (ROUTINE X 2)     Status: None   Collection Time    10/07/13  5:16 PM      Result Value Ref Range Status   Specimen Description BLOOD RIGHT ARM   Final   Special Requests BOTTLES DRAWN AEROBIC AND ANAEROBIC 10CC   Final   Culture  Setup Time     Final   Value: 10/07/2013 20:20     Performed at [facility]   Culture     Final   Value: NO GROWTH 5 DAYS     Performed at [facility]   Report Status 10/13/2013 FINAL   Final    Coagulation Studies: No results found for this basename: LABPROT, INR,  in the last 72 hours  Imaging: Mr Jeri Cos Wo Contrast  12/12/2013   CLINICAL DATA:  New complex partial seizure.  EXAM: MRI HEAD WITHOUT AND WITH CONTRAST  TECHNIQUE: Multiplanar, multiecho pulse sequences of the brain and surrounding structures were obtained without and with intravenous contrast.  CONTRAST:  55mL MULTIHANCE GADOBENATE DIMEGLUMINE 529 MG/ML IV SOLN  COMPARISON:  Head CT 12/10/2013 and MRI 05/22/2013  FINDINGS: There is no evidence of acute infarct. Left frontal encephalomalacia is consistent with remote cortical infarct. There is a very small amount of increased diffusion weighted signal along the margins of the infarct without definite restricted diffusion on the ADC map. There is a small amount of enhancement along the margins of the infarct.  There is moderate cerebral atrophy. Small foci of T2 hyperintensity within the periventricular white matter bilaterally are nonspecific but compatible with mild chronic small vessel ischemic disease and not significantly changed from the prior MRI. There is symmetric  hippocampal atrophy bilaterally. Hippocampi are symmetric in signal. Remote lacunar infarcts are again seen in the left aspect of the pons and right greater than left thalami.  Prior bilateral cataract surgery is noted. Mild bilateral maxillary sinus mucosal thickening is present. Mastoid air cells are clear. Major intracranial vascular  flow voids are preserved.  IMPRESSION: 1. Old left frontal infarct with a small amount of superimposed subacute ischemia. 2. No intracranial mass lesion. 3. Unchanged, mild chronic small vessel ischemic disease and cerebral atrophy.   Electronically Signed   By: Logan Bores   On: 12/12/2013 14:03    Medications:  Scheduled: . amLODipine  10 mg Oral q morning - 10a  . atorvastatin  10 mg Oral q morning - 10a  . Carbidopa-Levodopa ER  1 tablet Oral Once  . clonazePAM  0.25 mg Oral BID  . clopidogrel  75 mg Oral Q breakfast  . docusate sodium  100 mg Oral BID  . ferrous sulfate  325 mg Oral TID PC  . furosemide  20 mg Oral Daily  . heparin  5,000 Units Subcutaneous 3 times per day  . insulin aspart  0-5 Units Subcutaneous QHS  . insulin aspart  0-9 Units Subcutaneous TID WC  . levETIRAcetam  500 mg Oral BID  . loratadine  10 mg Oral Daily  . LORazepam  1 mg Intravenous Once  . Memantine HCl ER  1 capsule Oral q morning - 10a  . pantoprazole  80 mg Oral Daily  . tamsulosin  0.4 mg Oral q morning - 10a    Assessment/Plan: No significant response noted to Sinemet.  EEG shows no evidence of seizure activity.  Concerned that the patient's movements may be a side effect from Zyprexa.    Recommendations: 1.  D/C Sinemet and Zyprexa.  Discontinuation of Zyprexa may not cause immediate cessation of symptoms.  This may take a period of time.  Family aware.   2.  Change Clonazepam from prn to BID dosing.   3.  Patient to follow up with neurology as an outpatient.    LOS: 4 days   Alexis Goodell, MD [facility] [phone]  12/14/2013  11:29  AM

## 2013-12-14 NOTE — Clinical Social Work Placement (Addendum)
Clinical Social Work Department CLINICAL SOCIAL WORK PLACEMENT NOTE 12/14/2013  Patient:  [name], [name]  Account Number:  [id number] Admit date:  12/10/2013  Clinical Social Worker:  Raquel Sarna [place], LCSWA  Date/time:  12/14/2013 10:49 AM  Clinical Social Work is seeking post-discharge placement for this patient at the following level of care:   SKILLED NURSING   (*CSW will update this form in Epic as items are completed)   12/14/2013  Patient/family provided with [place] Department of Clinical Social Works list of facilities offering this level of care within the geographic area requested by the patient (or if unable, by the patients family).  12/14/2013  Patient/family informed of their freedom to choose among providers that offer the needed level of care, that participate in Medicare, Medicaid or managed care program needed by the patient, have an available bed and are willing to accept the patient.  12/14/2013  Patient/family informed of [facility] ownership interest in [facility], as well as of the fact that they are under no obligation to receive care at this facility.  PASARR submitted to EDS on  PASARR number received from [place] on   FL2 transmitted to all facilities in geographic area requested by pt/family on  12/14/2013 FL2 transmitted to all facilities within larger geographic area on   Patient informed that his/her managed care company has contracts with or will negotiate with  certain facilities, including the following:     Patient/family informed of bed offers received:  12/14/2013 Patient chooses bed at  [facility] Physician recommends and patient chooses bed at    Patient to be transferred to  on  12/14/2013 Patient to be transferred to facility by  [facility]  The following physician request were entered in Epic:   Additional Comments: PASARR already existing.  Pati Gallo, [place] Social Worker [phone]

## 2013-12-14 NOTE — Progress Notes (Signed)
Pt d/c to snf by ambulance. Assessment stable.

## 2013-12-14 NOTE — Discharge Summary (Signed)
Physician Discharge Summary  [name] [id number] DOB: March 09, 1931 DOA: 12/10/2013  PCP: Cathlean Cower, MD  Admit date: 12/10/2013 Discharge date: 12/14/2013  Time spent: 45 minutes  Recommendations for Outpatient Follow-up:  Patient will be discharged to a skilled nursing facility. She is to follow up with her primary care physician within one week of discharge as well as [facility]. Patient should take her medications as prescribed. She should follow a carb modified diet. Patient is also to follow recommendations made by physical therapy as well as occupational therapy at the skilled nursing facility.  Discharge Diagnoses:  Tremors/abnormal involuntary movements Hypertension Diabetes mellitus Dementia Depression/ anxiety  Discharge Condition: Stable  Diet recommendation: Carb modified  Filed Weights   12/10/13 2022  Weight: 67.45 kg (148 lb 11.2 oz)    History of present illness:  [name] is an 80 y.o. female with hx of Dm2, HTN, hyperlipidemia, TIA, moderate and progressive dementia, prior CVA (right thalamus and pons by head CT 2013, sent to the urgent care with presentation of tremor and shaking. She reported she felt the "shake" which started in the occiput, and radiated toward both of her upper extremities. Family confirmed these, and stated that it took a few minutes between the original symptoms and the rest. During his time, her speech become somewhat aphasic, and her lips were drawn. Her lower extremities were tightening up as well. She was less responsive for a couple of hours. She had about three incidences for the past month or so. She was presented originally as code stroke. Neurology was consulted, and Dr Nicole Kindred saw her, felt it was likely a complex partial seizure, recommended IV keppra and admission for further work up. Her work up included a CT of the head showing old small infarct, none acute. She has no leukocytosis, Hb of 10.5 grams  per dL, and Cr of 1.2. Hospitalist was asked to admit her. When I saw her, she was stable hemodynamically and was able to converse meaningfully with me, though memory was very poor.   Hospital Course:  Tremors/Involuntary movements  -Facial and hand, complicated by anxiety  -EEG: Findings of involuntary muscle tremors  -MRI of the brain: Old left frontal infarct with a small amount of superimposed acute ischemia  -Neurology consulted and following  -Was given one dose of Sinemet, however, no improvement -May be secondary to extrapyramidal effects of Zyprexa. -Neurology recommended discontinuation of Zyprexa -Continue Keppra 500 mg twice a day per neurology recommendation   Hypertension  -Stable, continue amlodipine, Lasix   Diabetes mellitus  -Continue home medications  Dementia  -Continue memantine   Depression/anxiety  -Continue klonopin -Zyprexa discontinued   Procedures: [id number] is a normal EEG recording during wakefulness. No evidence of an epileptic disorder is demonstrated. Muscle artifact occurring with irregular frequency of 3 Hz is consistent with tremor, likely involuntary.  Consultations: Neurology  Discharge Exam: Filed Vitals:   12/14/13 1036  BP: 156/60  Pulse: 89  Temp: 98.1 F (36.7 C)  Resp: 18   Exam  General: Well developed, well nourished, NAD, appears stated age  HEENT: NCAT, PERRLA, EOMI, Anicteic Sclera, mucous membranes moist. Facial tremor  Neck: Supple, no JVD, no masses  Cardiovascular: S1 S2 auscultated, Regular rate and rhythm.  Respiratory: Clear to auscultation bilaterally with equal chest rise  Abdomen: Soft, nontender, nondistended, + bowel sounds  Extremities: warm dry without cyanosis clubbing or edema  Neuro: AAOx3, cranial nerves grossly intact. No focal deficits.  Skin: Without rashes exudates  or nodules  Psych: Normal affect and demeanor with intact judgement and insight  Discharge Instructions      Discharge Orders    Future Appointments Provider Department Dept Phone   12/28/2013 2:00 PM Dennie Bible, NP [facility] [phone]   04/04/2014 10:00 AM Biagio Borg, MD [place] [phone]   Future Orders Complete By Expires   Diet Carb Modified  As directed    Discharge instructions  As directed    Increase activity slowly  As directed        Medication List    STOP taking these medications       Melatonin 1 MG Subl     OLANZapine 2.5 MG tablet  Commonly known as:  ZYPREXA      TAKE these medications       amLODipine 10 MG tablet  Commonly known as:  NORVASC  Take 10 mg by mouth every morning.     atorvastatin 10 MG tablet  Commonly known as:  LIPITOR  Take 10 mg by mouth every morning.     cetirizine 10 MG tablet  Commonly known as:  ZYRTEC  Take 10 mg by mouth every morning.     clonazePAM 0.25 MG disintegrating tablet  Commonly known as:  KLONOPIN  Take 0.25 mg by mouth 2 (two) times daily as needed (anxiety).     clopidogrel 75 MG tablet  Commonly known as:  PLAVIX  Take 75 mg by mouth daily with breakfast.     DSS 100 MG Caps  Take 100 mg by mouth 2 (two) times daily.     feeding supplement (GLUCERNA SHAKE) Liqd  Take 237 mLs by mouth 2 (two) times daily between meals.     ferrous sulfate 325 (65 FE) MG tablet  Take 1 tablet (325 mg total) by mouth 3 (three) times daily after meals.     furosemide 20 MG tablet  Commonly known as:  LASIX  Take 1 tablet (20 mg total) by mouth daily.     insulin glargine 100 UNIT/ML injection  Commonly known as:  LANTUS  Inject 10 Units into the skin every morning.     levETIRAcetam 500 MG tablet  Commonly known as:  KEPPRA  Take 1 tablet (500 mg total) by mouth 2 (two) times daily.     metFORMIN 500 MG tablet  Commonly known as:  GLUCOPHAGE  Take 1 tablet (500 mg total) by mouth 2 (two) times daily with a meal.     NAMENDA XR 28 MG Cp24  Generic drug:  Memantine HCl ER    Take 1 capsule by mouth every morning.     omeprazole 20 MG capsule  Commonly known as:  PRILOSEC  Take 20 mg by mouth daily.     tamsulosin 0.4 MG Caps capsule  Commonly known as:  FLOMAX  Take 0.4 mg by mouth every morning.       Allergies  Allergen Reactions  . Other Nausea And Vomiting    ANESTHESIA AND STRONG PAIN MEDS-VERY SENSITIVE  . Codeine Nausea And Vomiting and Rash  . Hydrocodone-Acetaminophen Nausea And Vomiting and Rash  . Keflex [Cephalexin] Other (See Comments)    Pt and her son do not know what the reaction is to this medication  . Oxycodone-Acetaminophen Nausea And Vomiting and Rash   Follow-up Information   Follow up with Cathlean Cower, MD. Schedule an appointment as soon as possible for a visit in 1 week. [facility] followup)  Specialties:  Internal Medicine, Radiology   Contact information:   [place] [address] [phone]       Follow up with [facility]. Schedule an appointment as soon as possible for a visit in 2 weeks. [facility] followup)    Specialty:  Neurology   Contact information:   [address] [place] [address] [phone]       The results of significant diagnostics from this hospitalization (including imaging, microbiology, ancillary and laboratory) are listed below for reference.    Significant Diagnostic Studies: Ct Head (brain) Wo Contrast  12/10/2013   CLINICAL DATA:  Tremors  EXAM: CT HEAD WITHOUT CONTRAST  TECHNIQUE: Contiguous axial images were obtained from the base of the skull through the vertex without intravenous contrast. Study was obtained within 24 hr of patient's arrival at the emergency department.  COMPARISON:  October 05, 2013 and May 01, 2009  FINDINGS: There is moderate diffuse atrophy. There is no appreciable mass, hemorrhage, extra-axial fluid collection, or midline shift. There is evidence of a prior infarct in the mid left frontal lobe. There is  evidence of a prior small infarct in the right thalamus. There is a prior small infarct in the left pons. There is patchy small vessel disease in the centra semiovale bilaterally. There is no new gray-white compartment lesion. No acute infarct is appreciable.  An area of relative lucency in the left frontal bone is stable compared to 2010. Bony calvarium appears stable. Mastoid air cells are clear. There are areas of mild mucosal thickening in both maxillary antra.  IMPRESSION: Atrophy with small vessel disease. Prior small infarcts. No acute appearing infarct. No hemorrhage or mass effect. Mild maxillary sinus disease bilaterally.  Critical Value/emergent results were called by telephone at the time of interpretation on 12/10/2013 at 5:40 PM to Dr. Nicole Kindred, neruology , who verbally acknowledged these results.   Electronically Signed   By: Lowella Grip M.D.   On: 12/10/2013 17:41   Mr Jeri Cos YQ Contrast  12/12/2013   CLINICAL DATA:  New complex partial seizure.  EXAM: MRI HEAD WITHOUT AND WITH CONTRAST  TECHNIQUE: Multiplanar, multiecho pulse sequences of the brain and surrounding structures were obtained without and with intravenous contrast.  CONTRAST:  29mL MULTIHANCE GADOBENATE DIMEGLUMINE 529 MG/ML IV SOLN  COMPARISON:  Head CT 12/10/2013 and MRI 05/22/2013  FINDINGS: There is no evidence of acute infarct. Left frontal encephalomalacia is consistent with remote cortical infarct. There is a very small amount of increased diffusion weighted signal along the margins of the infarct without definite restricted diffusion on the ADC map. There is a small amount of enhancement along the margins of the infarct.  There is moderate cerebral atrophy. Small foci of T2 hyperintensity within the periventricular white matter bilaterally are nonspecific but compatible with mild chronic small vessel ischemic disease and not significantly changed from the prior MRI. There is symmetric hippocampal atrophy bilaterally.  Hippocampi are symmetric in signal. Remote lacunar infarcts are again seen in the left aspect of the pons and right greater than left thalami.  Prior bilateral cataract surgery is noted. Mild bilateral maxillary sinus mucosal thickening is present. Mastoid air cells are clear. Major intracranial vascular flow voids are preserved.  IMPRESSION: 1. Old left frontal infarct with a small amount of superimposed subacute ischemia. 2. No intracranial mass lesion. 3. Unchanged, mild chronic small vessel ischemic disease and cerebral atrophy.   Electronically Signed   By: Logan Bores  On: 12/12/2013 14:03   Dg Abd Portable 1v  12/11/2013   CLINICAL DATA:  Nausea, history diabetes, hypertension, hiatal hernia  EXAM: PORTABLE ABDOMEN - 1 VIEW  COMPARISON:  Portable exam 1020 hr compared to 10/05/2013  FINDINGS: Normal bowel gas pattern.  No bowel dilatation or bowel wall thickening.  Osseous demineralization with levoconvex thoracolumbar scoliosis.  Scattered atherosclerotic calcifications aorta.  Left pelvic phlebolith.  No definite urinary tract calcification.  IMPRESSION: Nonobstructive bowel gas pattern.   Electronically Signed   By: Lavonia Dana M.D.   On: 12/11/2013 11:43    Microbiology: No results found for this or any previous visit (from the past 240 hour(s)).   Labs: Basic Metabolic Panel:  Recent Labs Lab 12/10/13 1723 12/10/13 1747 12/11/13 1005  NA 140 140 145  K 4.2 3.7 3.6*  CL 97 103 105  CO2 20  --  25  GLUCOSE 110* 110* 144*  BUN 18 17 13   CREATININE 1.20* 1.30* 0.98  CALCIUM 9.6  --  9.0   Liver Function Tests:  Recent Labs Lab 12/10/13 1723 12/11/13 1005  AST 17 13  ALT 12 10  ALKPHOS 55 50  BILITOT 0.3 0.3  PROT 7.7 6.8  ALBUMIN 3.6 3.1*   No results found for this basename: LIPASE, AMYLASE,  in the last 168 hours No results found for this basename: AMMONIA,  in the last 168 hours CBC:  Recent Labs Lab 12/10/13 1723 12/10/13 1747  WBC 8.1  --   NEUTROABS 4.9   --   HGB 9.6* 10.5*  HCT 28.6* 31.0*  MCV 88.0  --   PLT 291  --    Cardiac Enzymes:  Recent Labs Lab 12/13/13 1154  CKTOTAL 57  CKMB <1.0  TROPONINI <0.30   BNP: BNP (last 3 results)  Recent Labs  05/22/13 1640 10/13/13 1305 10/16/13 0423  PROBNP 173.3 1431.0* 718.6*   CBG:  Recent Labs Lab 12/13/13 1148 12/13/13 1625 12/14/13 0233 12/14/13 0604 12/14/13 1135  GLUCAP 100* 352* 178* 205* 181*     Signed:  Mordche Hedglin  [facility] 12/14/2013, 2:10 PM

## 2013-12-14 NOTE — Clinical Social Work Note (Signed)
Discharge summary has been faxed to [facility]. Discharge packet is complete and placed on pt's shadow chart. CSW has updated pt's family with pt's discharge to SNF. Per family, family will meet pt at facility. CSW has arranged for ambulance transportation via [facility]. RN updated on information above.  RN to please call report to [facility] [place], [place] Worker [phone]

## 2013-12-28 ENCOUNTER — Ambulatory Visit (INDEPENDENT_AMBULATORY_CARE_PROVIDER_SITE_OTHER): Payer: Medicare Other | Admitting: Nurse Practitioner

## 2013-12-28 ENCOUNTER — Encounter: Payer: Self-pay | Admitting: Nurse Practitioner

## 2013-12-28 VITALS — BP 134/65 | HR 95

## 2013-12-28 DIAGNOSIS — G40209 Localization-related (focal) (partial) symptomatic epilepsy and epileptic syndromes with complex partial seizures, not intractable, without status epilepticus: Secondary | ICD-10-CM

## 2013-12-28 DIAGNOSIS — F0390 Unspecified dementia without behavioral disturbance: Secondary | ICD-10-CM

## 2013-12-28 DIAGNOSIS — R259 Unspecified abnormal involuntary movements: Secondary | ICD-10-CM

## 2013-12-28 NOTE — Patient Instructions (Signed)
Per nsg home sheet 

## 2013-12-28 NOTE — Progress Notes (Signed)
[facility]  PATIENT: [name] DOB: 1930/10/08   REASON FOR VISIT: Followup for dementia and tremors   HISTORY OF PRESENT ILLNESS:[name], 80 year old female returns for followup. She had a four-day hospital admission for 2015 or tremors, questionable stroke. MRI of the brain with old left frontal infarct,  Mild chronic small vessel disease and atrophy noted. EEG was normal with  involuntary muscle tremors noted. She was placed on Keppra 500 twice a day. In addition she was placed on Sinemet without benefit for the tremors. Zyprexa was discontinued. Hemoglobin was noted to be low  At 9.6. Capillary glucose 170s to  200. She has continued to have tremors of the jaw, and hands and extremities. She has been placed into a nursing facility within the last 3 months, previously she had been living with her son. She is getting some physical therapy at the facility since her discharge. She is ambulating with a rolling walker. She returns for reevaluation  REVIEW OF SYSTEMS: Full 14 system review of systems performed and notable only for those listed, all others are neg:  Constitutional: poor appetite Cardiovascular: N/A  Ear/Nose/Throat: N/A  Skin: N/A  Eyes: N/A  Respiratory: N/A  Gastroitestinal: N/A  Hematology/Lymphatic: N/A  Endocrine: N/A Musculoskeletal:N/A  Allergy/Immunology: N/A  Neurological: tremors, memory loss Psychiatric: anxious   ALLERGIES: Allergies  Allergen Reactions  . Other Nausea And Vomiting    ANESTHESIA AND STRONG PAIN MEDS-VERY SENSITIVE  . Codeine Nausea And Vomiting and Rash  . Hydrocodone-Acetaminophen Nausea And Vomiting and Rash  . Keflex [Cephalexin] Other (See Comments)    Pt and her son do not know what the reaction is to this medication  . Oxycodone-Acetaminophen Nausea And Vomiting and Rash    HOME MEDICATIONS: Outpatient Prescriptions Prior to Visit  Medication Sig Dispense Refill  . amLODipine (NORVASC) 10 MG  tablet Take 10 mg by mouth every morning.      Marland Kitchen atorvastatin (LIPITOR) 10 MG tablet Take 10 mg by mouth every morning.      . cetirizine (ZYRTEC) 10 MG tablet Take 10 mg by mouth every morning.       . clonazePAM (KLONOPIN) 0.25 MG disintegrating tablet Take 0.25 mg by mouth 2 (two) times daily as needed (anxiety).      . clopidogrel (PLAVIX) 75 MG tablet Take 75 mg by mouth daily with breakfast.      . docusate sodium 100 MG CAPS Take 100 mg by mouth 2 (two) times daily.  10 capsule  0  . feeding supplement, GLUCERNA SHAKE, (GLUCERNA SHAKE) LIQD Take 237 mLs by mouth 2 (two) times daily between meals.  60 Can  0  . ferrous sulfate 325 (65 FE) MG tablet Take 1 tablet (325 mg total) by mouth 3 (three) times daily after meals.  90 tablet  0  . furosemide (LASIX) 20 MG tablet Take 1 tablet (20 mg total) by mouth daily.  30 tablet    . insulin glargine (LANTUS) 100 UNIT/ML injection Inject 10 Units into the skin every morning.       . levETIRAcetam (KEPPRA) 500 MG tablet Take 1 tablet (500 mg total) by mouth 2 (two) times daily.      . Memantine HCl ER (NAMENDA XR) 28 MG CP24 Take 1 capsule by mouth every morning.      . metFORMIN (GLUCOPHAGE) 500 MG tablet Take 1 tablet (500 mg total) by mouth 2 (two) times daily with a meal.  180 tablet  3  .  omeprazole (PRILOSEC) 20 MG capsule Take 20 mg by mouth daily.      . tamsulosin (FLOMAX) 0.4 MG CAPS capsule Take 0.4 mg by mouth every morning.       No facility-administered medications prior to visit.    PAST MEDICAL HISTORY: Past Medical History  Diagnosis Date  . COLONIC POLYPS 06/15/2005  . DIABETES MELLITUS, TYPE II 04/22/2007  . HYPERCHOLESTEROLEMIA   . HYPERLIPIDEMIA   . OBESITY   . ANEMIA-IRON DEFICIENCY   . DEPRESSION   . HYPERTENSION   . ALLERGIC RHINITIS   . ESOPHAGEAL STRICTURE   . GERD   . HIATAL HERNIA   . DIVERTICULOSIS, COLON   . BACK PAIN   . OSTEOPOROSIS   . DIZZINESS   . WEIGHT LOSS   . DYSPNEA   . Other dysphagia   .  COLONIC POLYPS, HX OF 04/03/2002 & 06/15/2005    TUBULAR ADENOMA  . History of CVA (cerebrovascular accident) 07/29/2012    Noted old, to right thalamus and pons by Head CT Jul 27, 2012  . Dementia 09/17/2012  . Type II or unspecified type diabetes mellitus without mention of complication, uncontrolled 07/16/2012    PAST SURGICAL HISTORY: Past Surgical History  Procedure Laterality Date  . Appendectomy    . Abdominal hysterectomy    . Tonsillectomy and adenoidectomy    . Left ankle surgury    . S/p bladder pubovaginal sling    . S/p cystocele/rectocele repair    . Laparoscopic takedown of incarcerated stomach within the chest/nissen  12/18/07  . Esophageal manometry  12/21/2011    Procedure: ESOPHAGEAL MANOMETRY (EM);  Surgeon: Sable Feil, MD;  Location: [facility];  Service: Endoscopy;  Laterality: N/A;    FAMILY HISTORY: Family History  Problem Relation Age of Onset  . Heart disease Mother 64  . Diabetes Mother   . Heart attack Mother   . Colon cancer Father   . Diabetes Father     SOCIAL HISTORY: History   Social History  . Marital Status: Widowed    Spouse Name: N/A    Number of Children: 2  . Years of Education: N/A   Occupational History  . retired    Social History Main Topics  . Smoking status: Never Smoker   . Smokeless tobacco: Never Used  . Alcohol Use: No  . Drug Use: No  . Sexual Activity: Not on file   Other Topics Concern  . Not on file   Social History Narrative   Husband of  30 years died 2008-12-17.   Patient lives at a nursing home.      PHYSICAL EXAM  Filed Vitals:   12/28/13 1352  BP: 134/65  Pulse: 95   Cannot calculate BMI with a height equal to zero.  Generalized: Well developed, frail appearing female in no acute distress  Head: normocephalic and atraumatic,. Oropharynx benign  Neck: Supple, no carotid bruits  Cardiac: Regular rate rhythm, no murmur  Musculoskeletal: No deformity   Neurological examination    Mentation: Alert oriented to time, place, MMSE 25/30. AFT 9. Follows all commands speech and language fluent  Cranial nerve II-XII: Pupils were equal round reactive to light extraocular movements were full, visual field were full on confrontational test. Facial sensation and strength were normal. Jaw tremor on the left more than the right. hearing was intact to finger rubbing bilaterally. Uvula tongue midline. head turning and shoulder shrug were normal and symmetric.Tongue protrusion into cheek strength was normal. Motor: normal  bulk and tone, full strength in the BUE, BLE, No focal weakness. Resting tremor and postural tremor however tremor dissipates with movement for instance when following commands for finger to nose finger. Sensory: normal and symmetric to light touch, pinprick, and  vibration  Coordination: finger-nose-finger, heel-to-shin bilaterally, no dysmetria, movements performed smoothly Reflexes: 1+ upper lower and symmetric, plantar responses were flexor bilaterally. Gait and Station: Rising up from seated position with assistance of 2 for standby, wide-based stance with rolling walker , ambulated 35 feet with standby assist, no difficulty with turns .Tandem gait not tested DIAGNOSTIC DATA (LABS, IMAGING, TESTING) - I reviewed patient records, labs, notes, testing and imaging myself where available.  Lab Results  Component Value Date   WBC 8.1 12/10/2013   HGB 10.5* 12/10/2013   HCT 31.0* 12/10/2013   MCV 88.0 12/10/2013   PLT 291 12/10/2013      Component Value Date/Time   NA 145 12/11/2013 1005   K 3.6* 12/11/2013 1005   CL 105 12/11/2013 1005   CO2 25 12/11/2013 1005   GLUCOSE 144* 12/11/2013 1005   BUN 13 12/11/2013 1005   CREATININE 0.98 12/11/2013 1005   CALCIUM 9.0 12/11/2013 1005   PROT 6.8 12/11/2013 1005   ALBUMIN 3.1* 12/11/2013 1005   AST 13 12/11/2013 1005   ALT 10 12/11/2013 1005   ALKPHOS 50 12/11/2013 1005   BILITOT 0.3 12/11/2013 1005   GFRNONAA 53* 12/11/2013 1005   GFRAA 61*  12/11/2013 1005   Lab Results  Component Value Date   CHOL 158 10/04/2013   HDL 72.70 10/04/2013   LDLCALC 68 10/04/2013   LDLDIRECT 158.0 04/06/2013   TRIG 88.0 10/04/2013   CHOLHDL 2 10/04/2013   Lab Results  Component Value Date   HGBA1C 7.4* 10/04/2013   Lab Results  Component Value Date   VITAMINB12 442 10/06/2013   Lab Results  Component Value Date   TSH 2.564 10/13/2013      ASSESSMENT AND PLAN  80 y.o. year old female  has a past medical history of  DIABETES MELLITUS, TYPE II (04/22/2007); HYPERCHOLESTEROLEMIA; HYPERLIPIDEMIA; OBESITY; ANEMIA-IRON DEFICIENCY; DEPRESSION; HYPERTENSION; HIATAL HERNIA; DIVERTICULOSIS, COLON; BACK PAIN; OSTEOPOROSIS;  WEIGHT LOSS; DYSPNEA; Other dysphagia;  History of CVA (cerebrovascular accident) (07/29/2012); Dementia (09/17/2012); and Type II or unspecified type diabetes mellitus without mention of complication, uncontrolled (07/16/2012).   Patient had recent hospital admission for tremor versus seizure disorder versus stroke.MRI of the brain with old left frontal infarct,  Mild chronic small vessel disease and atrophy noted. EEG was normal with  involuntary muscle tremors noted.  Differential diagnosis medication side effect, Parkinsonian syndrome as well as adjustment disorder due to recent  placement a nursing facility  Discussed with Dr. Krista Blue Will get EEG repeat Continue Keppra 500 twice daily Continue Namenda for memory, MMSE today 25/30 Followup in one month Dennie Bible, [facility], [facility], [facility] [address] [place], [place] [address] [phone]

## 2014-01-02 ENCOUNTER — Telehealth: Payer: Self-pay | Admitting: *Deleted

## 2014-01-02 NOTE — Telephone Encounter (Signed)
[name] at [phone] to reschedule patient appt. She was scheduled for 7-31 but cm had a cancellation so the patient is now scheduled for 02-12-14 @ 8:45am for a 9am appt. If they are not able to keep this appt they are to call and reschedule.

## 2014-01-03 ENCOUNTER — Other Ambulatory Visit: Payer: Medicare Other

## 2014-01-04 ENCOUNTER — Telehealth: Payer: Self-pay | Admitting: Nurse Practitioner

## 2014-01-04 NOTE — Telephone Encounter (Signed)
Patient would like to reschedule EEG. They arrived today for the appointment but it scheduled for yesterday. Please call to advise.

## 2014-01-30 ENCOUNTER — Ambulatory Visit (INDEPENDENT_AMBULATORY_CARE_PROVIDER_SITE_OTHER): Payer: Medicare Other | Admitting: Radiology

## 2014-01-30 DIAGNOSIS — F0390 Unspecified dementia without behavioral disturbance: Secondary | ICD-10-CM

## 2014-01-30 DIAGNOSIS — G40209 Localization-related (focal) (partial) symptomatic epilepsy and epileptic syndromes with complex partial seizures, not intractable, without status epilepticus: Secondary | ICD-10-CM

## 2014-02-01 ENCOUNTER — Ambulatory Visit (INDEPENDENT_AMBULATORY_CARE_PROVIDER_SITE_OTHER): Payer: Medicare Other | Admitting: Nurse Practitioner

## 2014-02-01 ENCOUNTER — Encounter: Payer: Self-pay | Admitting: Nurse Practitioner

## 2014-02-01 VITALS — BP 107/71 | HR 68

## 2014-02-01 DIAGNOSIS — R259 Unspecified abnormal involuntary movements: Secondary | ICD-10-CM

## 2014-02-01 DIAGNOSIS — F0390 Unspecified dementia without behavioral disturbance: Secondary | ICD-10-CM

## 2014-02-01 DIAGNOSIS — G40209 Localization-related (focal) (partial) symptomatic epilepsy and epileptic syndromes with complex partial seizures, not intractable, without status epilepticus: Secondary | ICD-10-CM

## 2014-02-01 MED ORDER — LEVETIRACETAM 500 MG PO TABS: ORAL_TABLET | ORAL | Status: DC

## 2014-02-01 NOTE — Progress Notes (Signed)
[facility]  PATIENT: [name] DOB: [birth year]   REASON FOR VISIT: Followup tremors versus seizure, dementia    HISTORY OF PRESENT ILLNESS:[name], 80 year old female returns for followup. She was last seen 12/28/2013 to followup from a hospital admission questionable stroke, tremors versus seizure. She is currently on Keppra 500 twice a day and her son who is accompanying her today says she still has at least one event every day where she is not aware of her surroundings and unable to communicate with her environment . She continues to have involuntary muscle tremors of both hands and arms as well as bilateral jaw tremor. Glucose has been  up and down per her son. Generally ranges around 180-200. She is now back at home with assistance from an outside agency. EEG performed in our office 01-30-2014 with diffuse slowing. No epileptiform activity noted. Since her last visit she has been placed on Celexa and Wellbutrin by her primary care. She returns for reevaluation HISTORY: She had a four-day hospital admission 4/5/ 2015 or tremors, questionable stroke. MRI of the brain with old left frontal infarct, Mild chronic small vessel disease and atrophy noted. EEG was normal with involuntary muscle tremors noted. She was placed on Keppra 500 twice a day. In addition she was placed on Sinemet without benefit for the tremors. Zyprexa was discontinued. Hemoglobin was noted to be low At 9.6. Capillary glucose 170s to 200. She has continued to have tremors of the jaw, and hands and extremities. She has been placed into a nursing facility within the last 3 months, previously she had been living with her son. She is getting some physical therapy at the facility since her discharge. She is ambulating with a rolling walker. She returns for reevaluation   REVIEW OF SYSTEMS: Full 14 system review of systems performed and notable only for those listed, all others are neg:    Constitutional: Fatigue Cardiovascular: N/A  Ear/Nose/Throat: N/A  Skin: N/A  Eyes: N/A  Respiratory: N/A  Gastroitestinal: N/A  Hematology/Lymphatic: N/A  Endocrine: N/A Musculoskeletal:N/A  Allergy/Immunology: N/A  Neurological: Involuntary movements, staring spells  Psychiatric: N/A Sleep : NA   ALLERGIES: Allergies  Allergen Reactions  . Other Nausea And Vomiting    ANESTHESIA AND STRONG PAIN MEDS-VERY SENSITIVE  . Codeine Nausea And Vomiting and Rash  . Hydrocodone-Acetaminophen Nausea And Vomiting and Rash  . Keflex [Cephalexin] Other (See Comments)    Pt and her son do not know what the reaction is to this medication  . Oxycodone-Acetaminophen Nausea And Vomiting and Rash    HOME MEDICATIONS: Outpatient Prescriptions Prior to Visit  Medication Sig Dispense Refill  . amLODipine (NORVASC) 10 MG tablet Take 10 mg by mouth every morning.      Marland Kitchen atorvastatin (LIPITOR) 10 MG tablet Take 10 mg by mouth every morning.      . cetirizine (ZYRTEC) 10 MG tablet Take 10 mg by mouth every morning.       . clonazePAM (KLONOPIN) 0.25 MG disintegrating tablet Take 0.25 mg by mouth 2 (two) times daily as needed (anxiety).      . clopidogrel (PLAVIX) 75 MG tablet Take 75 mg by mouth daily with breakfast.      . docusate sodium 100 MG CAPS Take 100 mg by mouth 2 (two) times daily.  10 capsule  0  . feeding supplement, GLUCERNA SHAKE, (GLUCERNA SHAKE) LIQD Take 237 mLs by mouth 2 (two) times daily between meals.  60 Can  0  . ferrous  sulfate 325 (65 FE) MG tablet Take 1 tablet (325 mg total) by mouth 3 (three) times daily after meals.  90 tablet  0  . furosemide (LASIX) 20 MG tablet Take 1 tablet (20 mg total) by mouth daily.  30 tablet    . insulin glargine (LANTUS) 100 UNIT/ML injection Inject 10 Units into the skin every morning.       . levETIRAcetam (KEPPRA) 500 MG tablet Take 1 tablet (500 mg total) by mouth 2 (two) times daily.      . Memantine HCl ER (NAMENDA XR) 28 MG CP24 Take  1 capsule by mouth every morning.      . metFORMIN (GLUCOPHAGE) 500 MG tablet Take 1 tablet (500 mg total) by mouth 2 (two) times daily with a meal.  180 tablet  3  . omeprazole (PRILOSEC) 20 MG capsule Take 20 mg by mouth daily.      . tamsulosin (FLOMAX) 0.4 MG CAPS capsule Take 0.4 mg by mouth every morning.       No facility-administered medications prior to visit.    PAST MEDICAL HISTORY: Past Medical History  Diagnosis Date  . COLONIC POLYPS 06/15/2005  . DIABETES MELLITUS, TYPE II 04/22/2007  . HYPERCHOLESTEROLEMIA   . HYPERLIPIDEMIA   . OBESITY   . ANEMIA-IRON DEFICIENCY   . DEPRESSION   . HYPERTENSION   . ALLERGIC RHINITIS   . ESOPHAGEAL STRICTURE   . GERD   . HIATAL HERNIA   . DIVERTICULOSIS, COLON   . BACK PAIN   . OSTEOPOROSIS   . DIZZINESS   . WEIGHT LOSS   . DYSPNEA   . Other dysphagia   . COLONIC POLYPS, HX OF 04/03/2002 & 06/15/2005    TUBULAR ADENOMA  . History of CVA (cerebrovascular accident) 07/29/2012    Noted old, to right thalamus and pons by Head CT Jul 27, 2012  . Dementia 09/17/2012  . Type II or unspecified type diabetes mellitus without mention of complication, uncontrolled 07/16/2012  . Movement disorder     PAST SURGICAL HISTORY: Past Surgical History  Procedure Laterality Date  . Appendectomy    . Abdominal hysterectomy    . Tonsillectomy and adenoidectomy    . Left ankle surgury    . S/p bladder pubovaginal sling    . S/p cystocele/rectocele repair    . Laparoscopic takedown of incarcerated stomach within the chest/nissen  12/19/07  . Esophageal manometry  12/21/2011    Procedure: ESOPHAGEAL MANOMETRY (EM);  Surgeon: Sable Feil, MD;  Location: [facility];  Service: Endoscopy;  Laterality: N/A;    FAMILY HISTORY: Family History  Problem Relation Age of Onset  . Heart disease Mother 12  . Diabetes Mother   . Heart attack Mother   . Colon cancer Father   . Diabetes Father     SOCIAL HISTORY: History   Social History  .  Marital Status: Widowed    Spouse Name: N/A    Number of Children: 2  . Years of Education: N/A   Occupational History  . retired    Social History Main Topics  . Smoking status: Never Smoker   . Smokeless tobacco: Never Used  . Alcohol Use: No  . Drug Use: No  . Sexual Activity: Not on file   Other Topics Concern  . Not on file   Social History Narrative   Husband of  53 years died 2008/12/18.   Patient lives at a nursing home.      PHYSICAL EXAM  Filed Vitals:   02/01/14 1403  BP: 107/71  Pulse: 68   Cannot calculate BMI with a height equal to zero.  Generalized: Well developed, frail appearing female in no acute distress  Head: normocephalic and atraumatic,. Oropharynx benign  Neck: Supple, no carotid bruits  Cardiac: Regular rate rhythm, no murmur  Musculoskeletal: No deformity   Neurological examination   Mentation: Alert, MMSE 17/30. AFT 3. Follows some  Commands, word finding difficulty  Cranial nerve II-XII: Pupils were equal round reactive to light extraocular movements were full, visual field were full on confrontational test. Facial sensation and strength were normal.  Bilateral jaw tremor  Uvula tongue midline. head turning and shoulder shrug were normal and symmetric.Tongue protrusion into cheek strength was normal. Motor: normal bulk and tone, full strength in the BUE, BLE,  resting tremor and postural tremor however tremor dissipates with movement when following commands for finger to nose  Coordination: finger-nose-finger,  no dysmetria Reflexes:  1+ upper lower and symmetric , plantar responses were flexor bilaterally. Gait and Station:  in wheelchair not ambulated   DIAGNOSTIC DATA (LABS, IMAGING, TESTING) - I reviewed patient records, labs, notes, testing and imaging myself where available.  Lab Results  Component Value Date   WBC 8.1 12/10/2013   HGB 10.5* 12/10/2013   HCT 31.0* 12/10/2013   MCV 88.0 12/10/2013   PLT 291 12/10/2013      Component  Value Date/Time   NA 145 12/11/2013 1005   K 3.6* 12/11/2013 1005   CL 105 12/11/2013 1005   CO2 25 12/11/2013 1005   GLUCOSE 144* 12/11/2013 1005   BUN 13 12/11/2013 1005   CREATININE 0.98 12/11/2013 1005   CALCIUM 9.0 12/11/2013 1005   PROT 6.8 12/11/2013 1005   ALBUMIN 3.1* 12/11/2013 1005   AST 13 12/11/2013 1005   ALT 10 12/11/2013 1005   ALKPHOS 50 12/11/2013 1005   BILITOT 0.3 12/11/2013 1005   GFRNONAA 53* 12/11/2013 1005   GFRAA 61* 12/11/2013 1005   Lab Results  Component Value Date   CHOL 158 10/04/2013   HDL 72.70 10/04/2013   LDLCALC 68 10/04/2013   LDLDIRECT 158.0 04/06/2013   TRIG 88.0 10/04/2013   CHOLHDL 2 10/04/2013   Lab Results  Component Value Date   HGBA1C 7.4* 10/04/2013   Lab Results  Component Value Date   VITAMINB12 442 10/06/2013   Lab Results  Component Value Date   TSH 2.564 10/13/2013      ASSESSMENT AND PLAN  80 y.o. year old female  has a past medical history of recent hospital admission (12/2013)for tremor versus seizure disorder versus stroke.MRI of the brain with old left frontal infarct, Mild chronic small vessel disease and atrophy noted. EEG was normal with involuntary muscle tremors noted. Differential diagnosis medication side effect, Parkinsonian syndrome as patient recently had Zyprexa discontinued. EEG in our office 01/30/2014 with  diffuse slowing without epileptiform activity.  Discussed with Dr. Krista Blue Increase Keppra to 500 a.m. 1000mg  at night Caution with Wellbutrin as it can cause seizures F/U with Dr. Krista Blue in 6 weeks Dennie Bible, [facility], [facility], [facility] [address], [place], [place] [address] [phone]

## 2014-02-01 NOTE — Patient Instructions (Signed)
Increase Keppra to 500 a.m. 1000mg  at night F/U with Dr. Krista Blue in 6 weeks

## 2014-02-02 NOTE — Procedures (Signed)
   HISTORY: 80 years old female, with past medical he of stroke in November 2013, recent hospital admission for tremor vs. stroke, vs. seizure, MRI showed old infarction  TECHNIQUE:  16 channel EEG was performed based on standard 10-16 international system. One channel was dedicated to EKG, which has demonstrates normal sinus rhythm of 78 beats per minutes.  Upon awakening, the posterior background activity was dysarrythmic theta range activity, with amplitude of 23 microvoltage, reactive to eye opening and closure. She has frequent bifrontal muscle artifact, Occasionally T5 sharp transients  Photic stimulation was performed, which induced no abnormality other than mentioned above.  Hyperventilation was not performed  Stage II sleep was achieved.  CONCLUSION: This is an abnormal EEG.  There is  electrodiagnostic evidence of generalized background slowing, indicating mild encephalopathic process, common etiology are metabolic toxic etiology, vs. advanced central nervous system degenerative disorders.

## 2014-02-08 ENCOUNTER — Other Ambulatory Visit: Payer: Self-pay | Admitting: Internal Medicine

## 2014-02-08 ENCOUNTER — Ambulatory Visit (INDEPENDENT_AMBULATORY_CARE_PROVIDER_SITE_OTHER): Payer: Medicare Other | Admitting: Internal Medicine

## 2014-02-08 ENCOUNTER — Other Ambulatory Visit (INDEPENDENT_AMBULATORY_CARE_PROVIDER_SITE_OTHER): Payer: Medicare Other

## 2014-02-08 ENCOUNTER — Ambulatory Visit (INDEPENDENT_AMBULATORY_CARE_PROVIDER_SITE_OTHER)
Admission: RE | Admit: 2014-02-08 | Discharge: 2014-02-08 | Disposition: A | Discharge status: Home / Self Care | Payer: Medicare Other | Source: Ambulatory Visit | Attending: Internal Medicine | Admitting: Internal Medicine

## 2014-02-08 ENCOUNTER — Encounter: Payer: Self-pay | Admitting: Internal Medicine

## 2014-02-08 VITALS — BP 142/90 | HR 87 | Temp 98.2°F | Wt 122.0 lb

## 2014-02-08 DIAGNOSIS — E1165 Type 2 diabetes mellitus with hyperglycemia: Principal | ICD-10-CM

## 2014-02-08 DIAGNOSIS — E1149 Type 2 diabetes mellitus with other diabetic neurological complication: Secondary | ICD-10-CM

## 2014-02-08 DIAGNOSIS — I10 Essential (primary) hypertension: Secondary | ICD-10-CM

## 2014-02-08 DIAGNOSIS — R0989 Other specified symptoms and signs involving the circulatory and respiratory systems: Secondary | ICD-10-CM

## 2014-02-08 DIAGNOSIS — E785 Hyperlipidemia, unspecified: Secondary | ICD-10-CM

## 2014-02-08 DIAGNOSIS — R5381 Other malaise: Secondary | ICD-10-CM

## 2014-02-08 DIAGNOSIS — E1142 Type 2 diabetes mellitus with diabetic polyneuropathy: Secondary | ICD-10-CM

## 2014-02-08 DIAGNOSIS — IMO0002 Reserved for concepts with insufficient information to code with codable children: Secondary | ICD-10-CM

## 2014-02-08 DIAGNOSIS — E1140 Type 2 diabetes mellitus with diabetic neuropathy, unspecified: Secondary | ICD-10-CM

## 2014-02-08 DIAGNOSIS — R5383 Other fatigue: Secondary | ICD-10-CM

## 2014-02-08 DIAGNOSIS — R531 Weakness: Secondary | ICD-10-CM

## 2014-02-08 LAB — BASIC METABOLIC PANEL
BUN: 16 mg/dL (ref 6–23)
CHLORIDE: 98 meq/L (ref 96–112)
CO2: 25 mEq/L (ref 19–32)
Calcium: 10 mg/dL (ref 8.4–10.5)
Creatinine, Ser: 1.4 mg/dL — ABNORMAL HIGH (ref 0.4–1.2)
GFR: 39.32 mL/min — ABNORMAL LOW (ref >60.00)
Glucose, Bld: 314 mg/dL — ABNORMAL HIGH (ref 70–99)
Potassium: 3 mEq/L — ABNORMAL LOW (ref 3.5–5.1)
Sodium: 135 mEq/L (ref 135–145)

## 2014-02-08 LAB — CBC WITH DIFFERENTIAL/PLATELET
Basophils Absolute: 0 10*3/uL (ref 0.0–0.1)
Basophils Relative: 0.5 % (ref 0.0–3.0)
EOS ABS: 0.1 10*3/uL (ref 0.0–0.7)
Eosinophils Relative: 2 % (ref 0.0–5.0)
HCT: 32.3 % — ABNORMAL LOW (ref 36.0–46.0)
Hemoglobin: 10.9 g/dL — ABNORMAL LOW (ref 12.0–15.0)
LYMPHS ABS: 1.8 10*3/uL (ref 0.7–4.0)
Lymphocytes Relative: 29.3 % (ref 12.0–46.0)
MCHC: 33.8 g/dL (ref 30.0–36.0)
MCV: 88.5 fl (ref 78.0–100.0)
MONO ABS: 0.3 10*3/uL (ref 0.1–1.0)
Monocytes Relative: 5.5 % (ref 3.0–12.0)
Neutro Abs: 3.9 10*3/uL (ref 1.4–7.7)
Neutrophils Relative %: 62.7 % (ref 43.0–77.0)
PLATELETS: 266 10*3/uL (ref 150.0–400.0)
RBC: 3.65 Mil/uL — ABNORMAL LOW (ref 3.87–5.11)
RDW: 13.7 % (ref 11.5–15.5)
WBC: 6.3 10*3/uL (ref 4.0–10.5)

## 2014-02-08 LAB — HEPATIC FUNCTION PANEL
ALK PHOS: 54 U/L (ref 39–117)
ALT: 16 U/L (ref 0–35)
AST: 21 U/L (ref 0–37)
Albumin: 3.9 g/dL (ref 3.5–5.2)
BILIRUBIN DIRECT: 0.1 mg/dL (ref 0.0–0.3)
BILIRUBIN TOTAL: 0.3 mg/dL (ref 0.2–1.2)
Total Protein: 7.3 g/dL (ref 6.0–8.3)

## 2014-02-08 LAB — LIPID PANEL
Cholesterol: 155 mg/dL (ref 0–200)
HDL: 65.4 mg/dL (ref >39.00)
LDL Cholesterol: 48 mg/dL (ref 0–99)
NonHDL: 89.6
Total CHOL/HDL Ratio: 2
Triglycerides: 206 mg/dL — ABNORMAL HIGH (ref 0.0–149.0)
VLDL: 41.2 mg/dL — ABNORMAL HIGH (ref 0.0–40.0)

## 2014-02-08 LAB — HEMOGLOBIN A1C: HEMOGLOBIN A1C: 7.7 % — AB (ref 4.6–6.5)

## 2014-02-08 LAB — TSH: TSH: 2.13 u[IU]/mL (ref 0.35–4.50)

## 2014-02-08 MED ORDER — CLONAZEPAM 0.25 MG PO TBDP: 0.2500 mg | ORAL_TABLET | Freq: Three times a day (TID) | ORAL | Status: DC

## 2014-02-08 MED ORDER — FUROSEMIDE 20 MG PO TABS: 20.0000 mg | ORAL_TABLET | Freq: Every day | ORAL | Status: DC

## 2014-02-08 MED ORDER — BUPROPION HCL 100 MG PO TABS: 100.0000 mg | ORAL_TABLET | Freq: Every day | ORAL | Status: DC

## 2014-02-08 MED ORDER — OMEPRAZOLE 20 MG PO CPDR: 20.0000 mg | DELAYED_RELEASE_CAPSULE | Freq: Every day | ORAL | Status: DC

## 2014-02-08 MED ORDER — CLOPIDOGREL BISULFATE 75 MG PO TABS: 75.0000 mg | ORAL_TABLET | Freq: Every day | ORAL | Status: DC

## 2014-02-08 MED ORDER — AMLODIPINE BESYLATE 10 MG PO TABS: 10.0000 mg | ORAL_TABLET | Freq: Every morning | ORAL | Status: DC

## 2014-02-08 MED ORDER — CITALOPRAM HYDROBROMIDE 10 MG PO TABS: 10.0000 mg | ORAL_TABLET | Freq: Every day | ORAL | Status: DC

## 2014-02-08 MED ORDER — TAMSULOSIN HCL 0.4 MG PO CAPS: 0.4000 mg | ORAL_CAPSULE | Freq: Every morning | ORAL | Status: DC

## 2014-02-08 MED ORDER — POTASSIUM CHLORIDE ER 10 MEQ PO TBCR: 10.0000 meq | EXTENDED_RELEASE_TABLET | Freq: Every day | ORAL | Status: DC

## 2014-02-08 MED ORDER — MEMANTINE HCL ER 28 MG PO CP24: 1.0000 | ORAL_CAPSULE | Freq: Every morning | ORAL | Status: DC

## 2014-02-08 MED ORDER — LEVETIRACETAM 500 MG PO TABS: ORAL_TABLET | ORAL | Status: DC

## 2014-02-08 MED ORDER — ATORVASTATIN CALCIUM 10 MG PO TABS: 10.0000 mg | ORAL_TABLET | Freq: Every morning | ORAL | Status: DC

## 2014-02-08 MED ORDER — METFORMIN HCL 500 MG PO TABS: 500.0000 mg | ORAL_TABLET | Freq: Two times a day (BID) | ORAL | Status: DC

## 2014-02-08 NOTE — Progress Notes (Signed)
Pre visit review using our clinic review tool, if applicable. No additional management support is needed unless otherwise documented below in the visit note. 

## 2014-02-08 NOTE — Patient Instructions (Signed)
Please continue all other medications as before, and refills have been done if requested.  Please have the pharmacy call with any other refills you may need.  You are otherwise up to date with prevention measures today.  Please keep your appointments with your specialists as you may have planned - neurology  Please go to the XRAY Department in the Basement (go straight as you get off the elevator) for the x-ray testing  Please go to the LAB in the Basement (turn left off the elevator) for the tests to be done today  You will be contacted by phone if any changes need to be made immediately.  Otherwise, you will receive a letter about your results with an explanation, but please check with MyChart first.  You are given the prescription orders for the hospital bed, and the urine testing by home health  Please return in 3 months, or sooner if needed  OK to cancel the appt next month, since you are here today

## 2014-02-08 NOTE — Progress Notes (Signed)
Subjective:    Patient ID: [name], female    DOB: November 25, 1930, 80 y.o.   MRN: [id number]  HPI  Here to f/u sp hospn then rehab, saw neurology may 28, keppra increased, no siezure, but has ongoing tremors; c/o pain to post neck with sort of spasms starting there, sometimes radiates to different areas such as head and back, current meds helping, family feels meds are helping; Has [facility] home health, BP over the past wk has been somewhat low documented about 70 per son , today more adequate BP, not clear why.  Has lost wt overall from 144 to 122 today since jan 0215.  Overal cont's generally weak, only eats about 600 cal per day, spends some days in bed.  Family trying to get her the ensures tid. Family asks change of insulin to pen, also hosp bed, due to weakness. Fell x 7 last wk (three times out of bed).  Has abrasion to left knee scabbed and healing today. Getting PT now, cna stays with her as well, cannot do her ADL's without complete assist, such as dressing, feeding or bathing.  Confusion seems worse at night.  Pt denies fever, has ongoing incont bowel and bladder, cant get to BR fas enough.  Pt denies chest pain, increased sob or doe, wheezing, orthopnea, PND, increased LE swelling, palpitations, dizziness or syncope. Dementia overall stable symptomatically, and not assoc with behavioral changes such as paranoia, or agitation, but having hallucinations more now off the zyprexa.  Needs form filled out for the [place] as well - needs assist now/urgent per family. Patient clearly cannot help herself, is largely bed bound, occas up in chair, can only stand with assist, and walk few steps only with assist.  Cannot manage her finances.  Is housebound, and in need of care of another person, 100% care, 24/7.  Unable to participate meaningfully in activities. Past Medical History  Diagnosis Date  . COLONIC POLYPS 06/15/2005  . DIABETES MELLITUS, TYPE II 04/22/2007  . HYPERCHOLESTEROLEMIA   . HYPERLIPIDEMIA    . OBESITY   . ANEMIA-IRON DEFICIENCY   . DEPRESSION   . HYPERTENSION   . ALLERGIC RHINITIS   . ESOPHAGEAL STRICTURE   . GERD   . HIATAL HERNIA   . DIVERTICULOSIS, COLON   . BACK PAIN   . OSTEOPOROSIS   . DIZZINESS   . WEIGHT LOSS   . DYSPNEA   . Other dysphagia   . COLONIC POLYPS, HX OF 04/03/2002 & 06/15/2005    TUBULAR ADENOMA  . History of CVA (cerebrovascular accident) 07/29/2012    Noted old, to right thalamus and pons by Head CT Jul 27, 2012  . Dementia 09/17/2012  . Type II or unspecified type diabetes mellitus without mention of complication, uncontrolled 07/16/2012  . Movement disorder    Past Surgical History  Procedure Laterality Date  . Appendectomy    . Abdominal hysterectomy    . Tonsillectomy and adenoidectomy    . Left ankle surgury    . S/p bladder pubovaginal sling    . S/p cystocele/rectocele repair    . Laparoscopic takedown of incarcerated stomach within the chest/nissen  2009  . Esophageal manometry  12/21/2011    Procedure: ESOPHAGEAL MANOMETRY (EM);  Surgeon: Sable Feil, MD;  Location: [facility];  Service: Endoscopy;  Laterality: N/A;    reports that she has never smoked. She has never used smokeless tobacco. She reports that she does not drink alcohol or use illicit drugs. family history  includes Colon cancer in her father; Diabetes in her father and mother; Heart attack in her mother; Heart disease (age of onset: 48) in her mother. Allergies  Allergen Reactions  . Other Nausea And Vomiting    ANESTHESIA AND STRONG PAIN MEDS-VERY SENSITIVE  . Codeine Nausea And Vomiting and Rash  . Hydrocodone-Acetaminophen Nausea And Vomiting and Rash  . Keflex [Cephalexin] Other (See Comments)    Pt and her son do not know what the reaction is to this medication  . Oxycodone-Acetaminophen Nausea And Vomiting and Rash   Current Outpatient Prescriptions on File Prior to Visit  Medication Sig Dispense Refill  . cetirizine (ZYRTEC) 10 MG tablet Take 10  mg by mouth every morning.       . citalopram (CELEXA) 10 MG tablet Take 10 mg by mouth daily.      . clonazePAM (KLONOPIN) 0.25 MG disintegrating tablet Take 0.25 mg by mouth 3 (three) times daily.       Marland Kitchen docusate sodium 100 MG CAPS Take 100 mg by mouth 2 (two) times daily.  10 capsule  0  . ferrous sulfate 325 (65 FE) MG tablet Take 1 tablet (325 mg total) by mouth 3 (three) times daily after meals.  90 tablet  0  . insulin glargine (LANTUS) 100 UNIT/ML injection Inject 10 Units into the skin every morning.        No current facility-administered medications on file prior to visit.   Review of Systems Unable due to pt dementia    Objective:   Physical Exam BP 142/90  Pulse 87  Temp(Src) 98.2 F (36.8 C) (Oral)  Wt 122 lb (55.339 kg)  SpO2 97% VS noted,  Constitutional: Pt appears well-developed, well-nourished.  HENT: Head: NCAT.  Right Ear: External ear normal.  Left Ear: External ear normal.  Eyes: . Pupils are equal, round, and reactive to light. Conjunctivae and EOM are normal Neck: Normal range of motion. Neck supple.  Cardiovascular: Normal rate and regular rhythm.   Pulmonary/Chest: Effort normal and breath sounds decresae RLL BS Abd:  Soft, NT, ND, + BS, no flank tender Neurological: Pt is alert. Not confused , motor grossly intact Skin: Skin is warm. No rash Psychiatric: Pt behavior is normal. No agitation.     Assessment & Plan:

## 2014-02-09 ENCOUNTER — Telehealth: Payer: Self-pay | Admitting: *Deleted

## 2014-02-09 NOTE — Assessment & Plan Note (Addendum)
Also for hosp bed, routine lab, Iand O cath UA per home health  Note:  Total time for pt hx, exam, review of record with pt in the room, determination of diagnoses and plan for further eval and tx is > 40 min, with over 50% spent in coordination and counseling of patient

## 2014-02-09 NOTE — Telephone Encounter (Signed)
Cathy from [facility] called states pts family is requesting Hospital Bed for pt.  Please fax order to [phone]

## 2014-02-09 NOTE — Telephone Encounter (Signed)
Order already given to son to give to them

## 2014-02-09 NOTE — Assessment & Plan Note (Signed)
stable overall by history and exam, recent data reviewed with pt, and pt to continue medical treatment as before,  to f/u any worsening symptoms or concerns BP Readings from Last 3 Encounters:  02/08/14 142/90  02/01/14 107/71  12/28/13 134/65

## 2014-02-09 NOTE — Assessment & Plan Note (Signed)
stable overall by history and exam, recent data reviewed with pt, and pt to continue medical treatment as before,  to f/u any worsening symptoms or concerns Lab Results  Component Value Date   LDLCALC 48 02/08/2014

## 2014-02-09 NOTE — Assessment & Plan Note (Signed)
stable overall by history and exam, recent data reviewed with pt, and pt to continue medical treatment as before,  to f/u any worsening symptoms or concerns Lab Results  Component Value Date   HGBA1C 7.7* 02/08/2014

## 2014-02-09 NOTE — Telephone Encounter (Signed)
Spoke with Tye [place] advised of MDs message

## 2014-02-09 NOTE — Assessment & Plan Note (Signed)
Also for cxr with diminished rll sounds,  to f/u any worsening symptoms or concerns

## 2014-02-10 ENCOUNTER — Emergency Department (HOSPITAL_COMMUNITY): Payer: Medicare Other

## 2014-02-10 ENCOUNTER — Inpatient Hospital Stay (HOSPITAL_COMMUNITY)
Admission: EM | Admit: 2014-02-10 | Discharge: 2014-02-13 | DRG: 535 | Disposition: A | Discharge status: Home Health | Payer: Medicare Other | Attending: Internal Medicine | Admitting: Internal Medicine

## 2014-02-10 ENCOUNTER — Encounter (HOSPITAL_COMMUNITY): Payer: Self-pay | Admitting: Emergency Medicine

## 2014-02-10 DIAGNOSIS — IMO0002 Reserved for concepts with insufficient information to code with codable children: Secondary | ICD-10-CM

## 2014-02-10 DIAGNOSIS — Z7902 Long term (current) use of antithrombotics/antiplatelets: Secondary | ICD-10-CM

## 2014-02-10 DIAGNOSIS — E119 Type 2 diabetes mellitus without complications: Secondary | ICD-10-CM | POA: Diagnosis present

## 2014-02-10 DIAGNOSIS — Z885 Allergy status to narcotic agent status: Secondary | ICD-10-CM

## 2014-02-10 DIAGNOSIS — E876 Hypokalemia: Secondary | ICD-10-CM | POA: Diagnosis present

## 2014-02-10 DIAGNOSIS — F329 Major depressive disorder, single episode, unspecified: Secondary | ICD-10-CM

## 2014-02-10 DIAGNOSIS — A498 Other bacterial infections of unspecified site: Secondary | ICD-10-CM | POA: Diagnosis present

## 2014-02-10 DIAGNOSIS — Z66 Do not resuscitate: Secondary | ICD-10-CM | POA: Diagnosis present

## 2014-02-10 DIAGNOSIS — R627 Adult failure to thrive: Secondary | ICD-10-CM | POA: Diagnosis present

## 2014-02-10 DIAGNOSIS — Y92009 Unspecified place in unspecified non-institutional (private) residence as the place of occurrence of the external cause: Secondary | ICD-10-CM

## 2014-02-10 DIAGNOSIS — K219 Gastro-esophageal reflux disease without esophagitis: Secondary | ICD-10-CM | POA: Diagnosis present

## 2014-02-10 DIAGNOSIS — S32509A Unspecified fracture of unspecified pubis, initial encounter for closed fracture: Principal | ICD-10-CM

## 2014-02-10 DIAGNOSIS — M810 Age-related osteoporosis without current pathological fracture: Secondary | ICD-10-CM | POA: Diagnosis present

## 2014-02-10 DIAGNOSIS — E785 Hyperlipidemia, unspecified: Secondary | ICD-10-CM | POA: Diagnosis present

## 2014-02-10 DIAGNOSIS — Z79899 Other long term (current) drug therapy: Secondary | ICD-10-CM

## 2014-02-10 DIAGNOSIS — S32599A Other specified fracture of unspecified pubis, initial encounter for closed fracture: Secondary | ICD-10-CM

## 2014-02-10 DIAGNOSIS — Z794 Long term (current) use of insulin: Secondary | ICD-10-CM

## 2014-02-10 DIAGNOSIS — D509 Iron deficiency anemia, unspecified: Secondary | ICD-10-CM | POA: Diagnosis present

## 2014-02-10 DIAGNOSIS — Z7401 Bed confinement status: Secondary | ICD-10-CM

## 2014-02-10 DIAGNOSIS — F3289 Other specified depressive episodes: Secondary | ICD-10-CM

## 2014-02-10 DIAGNOSIS — F0390 Unspecified dementia without behavioral disturbance: Secondary | ICD-10-CM

## 2014-02-10 DIAGNOSIS — S329XXA Fracture of unspecified parts of lumbosacral spine and pelvis, initial encounter for closed fracture: Secondary | ICD-10-CM | POA: Insufficient documentation

## 2014-02-10 DIAGNOSIS — E43 Unspecified severe protein-calorie malnutrition: Secondary | ICD-10-CM | POA: Diagnosis present

## 2014-02-10 DIAGNOSIS — I10 Essential (primary) hypertension: Secondary | ICD-10-CM | POA: Diagnosis present

## 2014-02-10 DIAGNOSIS — N390 Urinary tract infection, site not specified: Secondary | ICD-10-CM

## 2014-02-10 DIAGNOSIS — T148XXA Other injury of unspecified body region, initial encounter: Secondary | ICD-10-CM

## 2014-02-10 DIAGNOSIS — W19XXXA Unspecified fall, initial encounter: Secondary | ICD-10-CM

## 2014-02-10 DIAGNOSIS — F411 Generalized anxiety disorder: Secondary | ICD-10-CM | POA: Diagnosis present

## 2014-02-10 DIAGNOSIS — Z8673 Personal history of transient ischemic attack (TIA), and cerebral infarction without residual deficits: Secondary | ICD-10-CM

## 2014-02-10 DIAGNOSIS — R259 Unspecified abnormal involuntary movements: Secondary | ICD-10-CM

## 2014-02-10 LAB — CBC WITH DIFFERENTIAL/PLATELET
Basophils Absolute: 0 10*3/uL (ref 0.0–0.1)
Basophils Relative: 0 % (ref 0–1)
EOS ABS: 0.2 10*3/uL (ref 0.0–0.7)
Eosinophils Relative: 2 % (ref 0–5)
HEMATOCRIT: 30.9 % — AB (ref 36.0–46.0)
HEMOGLOBIN: 10.3 g/dL — AB (ref 12.0–15.0)
Lymphocytes Relative: 15 % (ref 12–46)
Lymphs Abs: 1.5 10*3/uL (ref 0.7–4.0)
MCH: 29.3 pg (ref 26.0–34.0)
MCHC: 33.3 g/dL (ref 30.0–36.0)
MCV: 88 fL (ref 78.0–100.0)
MONO ABS: 0.8 10*3/uL (ref 0.1–1.0)
MONOS PCT: 8 % (ref 3–12)
Neutro Abs: 7.5 10*3/uL (ref 1.7–7.7)
Neutrophils Relative %: 75 % (ref 43–77)
Platelets: 246 10*3/uL (ref 150–400)
RBC: 3.51 MIL/uL — ABNORMAL LOW (ref 3.87–5.11)
RDW: 13.3 % (ref 11.5–15.5)
WBC: 10 10*3/uL (ref 4.0–10.5)

## 2014-02-10 LAB — BASIC METABOLIC PANEL
BUN: 14 mg/dL (ref 6–23)
CHLORIDE: 103 meq/L (ref 96–112)
CO2: 27 meq/L (ref 19–32)
Calcium: 9.6 mg/dL (ref 8.4–10.5)
Creatinine, Ser: 1.16 mg/dL — ABNORMAL HIGH (ref 0.50–1.10)
GFR calc Af Amer: 50 mL/min — ABNORMAL LOW (ref >90)
GFR calc non Af Amer: 43 mL/min — ABNORMAL LOW (ref >90)
Glucose, Bld: 230 mg/dL — ABNORMAL HIGH (ref 70–99)
Potassium: 3.1 mEq/L — ABNORMAL LOW (ref 3.7–5.3)
Sodium: 144 mEq/L (ref 137–147)

## 2014-02-10 LAB — URINE MICROSCOPIC-ADD ON

## 2014-02-10 LAB — URINALYSIS, ROUTINE W REFLEX MICROSCOPIC
BILIRUBIN URINE: NEGATIVE
HGB URINE DIPSTICK: NEGATIVE
Ketones, ur: NEGATIVE mg/dL
Nitrite: POSITIVE — AB
Protein, ur: NEGATIVE mg/dL
SPECIFIC GRAVITY, URINE: 1.021 (ref 1.005–1.030)
Urobilinogen, UA: 0.2 mg/dL (ref 0.0–1.0)
pH: 5 (ref 5.0–8.0)

## 2014-02-10 LAB — ABO/RH: ABO/RH(D): A POS

## 2014-02-10 LAB — TYPE AND SCREEN
ABO/RH(D): A POS
Antibody Screen: NEGATIVE

## 2014-02-10 LAB — PROTIME-INR
INR: 1.05 (ref 0.00–1.49)
Prothrombin Time: 13.5 seconds (ref 11.6–15.2)

## 2014-02-10 LAB — GLUCOSE, CAPILLARY
GLUCOSE-CAPILLARY: 363 mg/dL — AB (ref 70–99)
Glucose-Capillary: 207 mg/dL — ABNORMAL HIGH (ref 70–99)
Glucose-Capillary: 363 mg/dL — ABNORMAL HIGH (ref 70–99)

## 2014-02-10 MED ORDER — SODIUM CHLORIDE 0.9 % IV SOLN
1000.0000 mL | INTRAVENOUS | Status: DC
  Administered 2014-02-10: 1000 mL via INTRAVENOUS

## 2014-02-10 MED ORDER — AMLODIPINE BESYLATE 10 MG PO TABS
10.0000 mg | ORAL_TABLET | Freq: Every morning | ORAL | Status: DC
  Administered 2014-02-11 – 2014-02-13 (×3): 10 mg via ORAL
  Filled 2014-02-10 (×3): qty 1

## 2014-02-10 MED ORDER — FENTANYL CITRATE 0.05 MG/ML IJ SOLN
50.0000 ug | Freq: Once | INTRAMUSCULAR | Status: DC
  Filled 2014-02-10: qty 2

## 2014-02-10 MED ORDER — ACETAMINOPHEN 500 MG PO TABS
500.0000 mg | ORAL_TABLET | Freq: Three times a day (TID) | ORAL | Status: DC
  Administered 2014-02-10 – 2014-02-13 (×9): 500 mg via ORAL
  Filled 2014-02-10 (×11): qty 1

## 2014-02-10 MED ORDER — DOCUSATE SODIUM 100 MG PO CAPS
100.0000 mg | ORAL_CAPSULE | Freq: Two times a day (BID) | ORAL | Status: DC
  Administered 2014-02-10 – 2014-02-13 (×6): 100 mg via ORAL
  Filled 2014-02-10 (×6): qty 1

## 2014-02-10 MED ORDER — CLOPIDOGREL BISULFATE 75 MG PO TABS
75.0000 mg | ORAL_TABLET | Freq: Every day | ORAL | Status: DC
  Administered 2014-02-11 – 2014-02-13 (×3): 75 mg via ORAL
  Filled 2014-02-10 (×4): qty 1

## 2014-02-10 MED ORDER — LEVETIRACETAM 500 MG PO TABS
500.0000 mg | ORAL_TABLET | Freq: Every day | ORAL | Status: DC
  Administered 2014-02-11 – 2014-02-13 (×3): 500 mg via ORAL
  Filled 2014-02-10 (×3): qty 1

## 2014-02-10 MED ORDER — CLONAZEPAM 0.5 MG PO TABS
0.2500 mg | ORAL_TABLET | Freq: Three times a day (TID) | ORAL | Status: DC
  Administered 2014-02-10 – 2014-02-13 (×8): 0.25 mg via ORAL
  Filled 2014-02-10 (×8): qty 1

## 2014-02-10 MED ORDER — LEVETIRACETAM 500 MG PO TABS
1000.0000 mg | ORAL_TABLET | Freq: Every day | ORAL | Status: DC
  Administered 2014-02-10 – 2014-02-12 (×3): 1000 mg via ORAL
  Filled 2014-02-10 (×4): qty 2

## 2014-02-10 MED ORDER — CLONAZEPAM 0.25 MG PO TBDP: 0.2500 mg | ORAL_TABLET | Freq: Three times a day (TID) | ORAL | Status: DC

## 2014-02-10 MED ORDER — INSULIN ASPART 100 UNIT/ML ~~LOC~~ SOLN
0.0000 [IU] | Freq: Three times a day (TID) | SUBCUTANEOUS | Status: DC
  Administered 2014-02-11: 2 [IU] via SUBCUTANEOUS
  Administered 2014-02-11: 9 [IU] via SUBCUTANEOUS
  Administered 2014-02-12: 1 [IU] via SUBCUTANEOUS
  Administered 2014-02-13: 3 [IU] via SUBCUTANEOUS

## 2014-02-10 MED ORDER — MEMANTINE HCL ER 28 MG PO CP24
1.0000 | ORAL_CAPSULE | Freq: Every morning | ORAL | Status: DC
  Administered 2014-02-11 – 2014-02-13 (×3): 28 mg via ORAL
  Filled 2014-02-10 (×3): qty 28

## 2014-02-10 MED ORDER — BUPROPION HCL 100 MG PO TABS
100.0000 mg | ORAL_TABLET | Freq: Every day | ORAL | Status: DC
  Administered 2014-02-11 – 2014-02-13 (×3): 100 mg via ORAL
  Filled 2014-02-10 (×3): qty 1

## 2014-02-10 MED ORDER — ONDANSETRON HCL 4 MG/2ML IJ SOLN: 4.0000 mg | Freq: Four times a day (QID) | INTRAMUSCULAR | Status: DC | PRN

## 2014-02-10 MED ORDER — CITALOPRAM HYDROBROMIDE 10 MG PO TABS
10.0000 mg | ORAL_TABLET | Freq: Every day | ORAL | Status: DC
  Administered 2014-02-11 – 2014-02-13 (×3): 10 mg via ORAL
  Filled 2014-02-10 (×3): qty 1

## 2014-02-10 MED ORDER — POTASSIUM CHLORIDE ER 10 MEQ PO TBCR
10.0000 meq | EXTENDED_RELEASE_TABLET | Freq: Every day | ORAL | Status: DC
  Administered 2014-02-11: 10 meq via ORAL
  Filled 2014-02-10 (×2): qty 1

## 2014-02-10 MED ORDER — PANTOPRAZOLE SODIUM 40 MG PO TBEC
40.0000 mg | DELAYED_RELEASE_TABLET | Freq: Every day | ORAL | Status: DC
  Administered 2014-02-11 – 2014-02-13 (×3): 40 mg via ORAL
  Filled 2014-02-10 (×3): qty 1

## 2014-02-10 MED ORDER — TAMSULOSIN HCL 0.4 MG PO CAPS
0.4000 mg | ORAL_CAPSULE | Freq: Every morning | ORAL | Status: DC
  Administered 2014-02-11 – 2014-02-13 (×3): 0.4 mg via ORAL
  Filled 2014-02-10 (×3): qty 1

## 2014-02-10 MED ORDER — ENOXAPARIN SODIUM 40 MG/0.4ML ~~LOC~~ SOLN
40.0000 mg | SUBCUTANEOUS | Status: DC
  Administered 2014-02-10 – 2014-02-12 (×3): 40 mg via SUBCUTANEOUS
  Filled 2014-02-10 (×4): qty 0.4

## 2014-02-10 MED ORDER — DEXTROSE 5 % IV SOLN
1.0000 g | INTRAVENOUS | Status: DC
  Administered 2014-02-10 – 2014-02-12 (×3): 1 g via INTRAVENOUS
  Filled 2014-02-10 (×4): qty 10

## 2014-02-10 MED ORDER — FENTANYL CITRATE 0.05 MG/ML IJ SOLN
50.0000 ug | Freq: Once | INTRAMUSCULAR | Status: AC
  Administered 2014-02-10: 50 ug via INTRAMUSCULAR

## 2014-02-10 MED ORDER — HYDROMORPHONE HCL PF 1 MG/ML IJ SOLN: 0.5000 mg | INTRAMUSCULAR | Status: DC | PRN

## 2014-02-10 MED ORDER — HYDROMORPHONE HCL PF 1 MG/ML IJ SOLN
0.5000 mg | INTRAMUSCULAR | Status: DC | PRN
  Administered 2014-02-10: 0.5 mg via INTRAVENOUS
  Filled 2014-02-10: qty 1

## 2014-02-10 MED ORDER — INSULIN GLARGINE 100 UNIT/ML ~~LOC~~ SOLN
20.0000 [IU] | Freq: Every morning | SUBCUTANEOUS | Status: DC
  Administered 2014-02-11 – 2014-02-13 (×3): 20 [IU] via SUBCUTANEOUS
  Filled 2014-02-10 (×3): qty 0.2

## 2014-02-10 MED ORDER — ONDANSETRON HCL 4 MG PO TABS: 4.0000 mg | ORAL_TABLET | Freq: Four times a day (QID) | ORAL | Status: DC | PRN

## 2014-02-10 MED ORDER — FUROSEMIDE 20 MG PO TABS
20.0000 mg | ORAL_TABLET | Freq: Every day | ORAL | Status: DC
  Administered 2014-02-11 – 2014-02-13 (×3): 20 mg via ORAL
  Filled 2014-02-10 (×3): qty 1

## 2014-02-10 MED ORDER — INSULIN ASPART 100 UNIT/ML ~~LOC~~ SOLN
0.0000 [IU] | Freq: Every day | SUBCUTANEOUS | Status: DC
  Administered 2014-02-10 – 2014-02-11 (×2): 5 [IU] via SUBCUTANEOUS

## 2014-02-10 MED ORDER — FENTANYL CITRATE 0.05 MG/ML IJ SOLN: 50.0000 ug | Freq: Once | INTRAMUSCULAR | Status: DC

## 2014-02-10 MED ORDER — INSULIN ASPART 100 UNIT/ML ~~LOC~~ SOLN: 0.0000 [IU] | Freq: Three times a day (TID) | SUBCUTANEOUS | Status: DC

## 2014-02-10 MED ORDER — SODIUM CHLORIDE 0.9 % IV SOLN
1000.0000 mL | INTRAVENOUS | Status: DC
  Administered 2014-02-10 – 2014-02-12 (×3): 1000 mL via INTRAVENOUS

## 2014-02-10 MED ORDER — ATORVASTATIN CALCIUM 10 MG PO TABS
10.0000 mg | ORAL_TABLET | Freq: Every morning | ORAL | Status: DC
  Administered 2014-02-11 – 2014-02-13 (×3): 10 mg via ORAL
  Filled 2014-02-10 (×3): qty 1

## 2014-02-10 NOTE — H&P (Signed)
[facility] History and Physical  [name] RXV:[id number] DOB: Oct 23, 1930 DOA: 02/10/2014  Referring physician: EDP PCP: Cathlean Cower, MD   Chief Complaint: fall, Hip pain  HPI: [name] is a 80 y.o. female with PMh of Moderate Dementia, confused at baseline, h/o CVA, H/o tremors/involuntary movements, DM, HTN, anxiety, bed bound and total care, currently living with her son at home and gets [facility] services. She is unable to ambulate, get out of bed independently and needs assistance with this, today she suffered an un witnessed fall when got Out of bed by herself. Brought to the ER, noted to have R pelvic fracture She has used up her medicare rehab days per son, in addition they also report ongoing worsening cognitive decline, poor Po intake and weight loss.    Review of Systems:  Constitutional:  No weight loss, night sweats, Fevers, chills, fatigue.  HEENT:  No headaches, Difficulty swallowing,Tooth/dental problems,Sore throat,  No sneezing, itching, ear ache, nasal congestion, post nasal drip,  Cardio-vascular:  No chest pain, Orthopnea, PND, swelling in lower extremities, anasarca, dizziness, palpitations  GI:  No heartburn, indigestion, abdominal pain, nausea, vomiting, diarrhea, change in bowel habits, loss of appetite  Resp:  No shortness of breath with exertion or at rest. No excess mucus, no productive cough, No non-productive cough, No coughing up of blood.No change in color of mucus.No wheezing.No chest wall deformity  Skin:  no rash or lesions.  GU:  no dysuria, change in color of urine, no urgency or frequency. No flank pain.  Musculoskeletal:  No joint pain or swelling. No decreased range of motion. No back pain.  Psych:  No change in mood or affect. No depression or anxiety. No memory loss.   Past Medical History  Diagnosis Date  . COLONIC POLYPS 06/15/2005  . DIABETES MELLITUS, TYPE II 04/22/2007  . HYPERCHOLESTEROLEMIA   . HYPERLIPIDEMIA   .  OBESITY   . ANEMIA-IRON DEFICIENCY   . DEPRESSION   . HYPERTENSION   . ALLERGIC RHINITIS   . ESOPHAGEAL STRICTURE   . GERD   . HIATAL HERNIA   . DIVERTICULOSIS, COLON   . BACK PAIN   . OSTEOPOROSIS   . DIZZINESS   . WEIGHT LOSS   . DYSPNEA   . Other dysphagia   . COLONIC POLYPS, HX OF 04/03/2002 & 06/15/2005    TUBULAR ADENOMA  . History of CVA (cerebrovascular accident) 07/29/2012    Noted old, to right thalamus and pons by Head CT Jul 27, 2012  . Dementia 09/17/2012  . Type II or unspecified type diabetes mellitus without mention of complication, uncontrolled 07/16/2012  . Movement disorder    Past Surgical History  Procedure Laterality Date  . Appendectomy    . Abdominal hysterectomy    . Tonsillectomy and adenoidectomy    . Left ankle surgury    . S/p bladder pubovaginal sling    . S/p cystocele/rectocele repair    . Laparoscopic takedown of incarcerated stomach within the chest/nissen  2009  . Esophageal manometry  12/21/2011    Procedure: ESOPHAGEAL MANOMETRY (EM);  Surgeon: Sable Feil, MD;  Location: [facility];  Service: Endoscopy;  Laterality: N/A;   Social History:  reports that she has never smoked. She has never used smokeless tobacco. She reports that she does not drink alcohol or use illicit drugs.  Allergies  Allergen Reactions  . Other Nausea And Vomiting    ANESTHESIA AND STRONG PAIN MEDS-VERY SENSITIVE  . Codeine Nausea And Vomiting  and Rash  . Hydrocodone-Acetaminophen Nausea And Vomiting and Rash  . Keflex [Cephalexin] Other (See Comments)    Pt and her son do not know what the reaction is to this medication  . Oxycodone-Acetaminophen Nausea And Vomiting and Rash    Family History  Problem Relation Age of Onset  . Heart disease Mother 80  . Diabetes Mother   . Heart attack Mother   . Colon cancer Father   . Diabetes Father      Prior to Admission medications   Medication Sig Start Date End Date Taking? Authorizing Provider    amLODipine (NORVASC) 10 MG tablet Take 1 tablet (10 mg total) by mouth every morning. 02/08/14  Yes Biagio Borg, MD  atorvastatin (LIPITOR) 10 MG tablet Take 1 tablet (10 mg total) by mouth every morning. 02/08/14  Yes Biagio Borg, MD  buPROPion (WELLBUTRIN) 100 MG tablet Take 1 tablet (100 mg total) by mouth daily. 02/08/14  Yes Biagio Borg, MD  cetirizine (ZYRTEC) 10 MG tablet Take 10 mg by mouth every morning.    Yes Historical Provider, MD  citalopram (CELEXA) 10 MG tablet Take 1 tablet (10 mg total) by mouth daily. 02/08/14  Yes Biagio Borg, MD  clonazePAM (KLONOPIN) 0.25 MG disintegrating tablet Take 1 tablet (0.25 mg total) by mouth 3 (three) times daily. 02/08/14  Yes Biagio Borg, MD  clopidogrel (PLAVIX) 75 MG tablet Take 1 tablet (75 mg total) by mouth daily with breakfast. 02/08/14  Yes Biagio Borg, MD  docusate sodium 100 MG CAPS Take 100 mg by mouth 2 (two) times daily. 10/09/13  Yes Janece Canterbury, MD  ferrous sulfate 325 (65 FE) MG tablet Take 1 tablet (325 mg total) by mouth 3 (three) times daily after meals. 10/09/13  Yes Janece Canterbury, MD  furosemide (LASIX) 20 MG tablet Take 1 tablet (20 mg total) by mouth daily. 02/08/14  Yes Biagio Borg, MD  insulin glargine (LANTUS) 100 UNIT/ML injection Inject 20 Units into the skin every morning.    Yes Historical Provider, MD  levETIRAcetam (KEPPRA) 500 MG tablet Take 500-1,000 mg by mouth 2 (two) times daily. 1 am 2pm 02/08/14  Yes Biagio Borg, MD  Memantine HCl ER (NAMENDA XR) 28 MG CP24 Take 28 mg by mouth every morning. 02/08/14  Yes Biagio Borg, MD  metFORMIN (GLUCOPHAGE) 500 MG tablet Take 1 tablet (500 mg total) by mouth 2 (two) times daily with a meal. 02/08/14  Yes Biagio Borg, MD  omeprazole (PRILOSEC) 20 MG capsule Take 1 capsule (20 mg total) by mouth daily. 02/08/14  Yes Biagio Borg, MD  potassium chloride (KLOR-CON 10) 10 MEQ tablet Take 1 tablet (10 mEq total) by mouth daily. 02/08/14  Yes Biagio Borg, MD  tamsulosin (FLOMAX) 0.4 MG CAPS  capsule Take 1 capsule (0.4 mg total) by mouth every morning. 02/08/14  Yes Biagio Borg, MD   Physical Exam: Filed Vitals:   02/10/14 1645  BP: 155/40  Pulse:   Temp:   Resp: 17    BP 155/40  Pulse 82  Temp(Src) 98.1 F (36.7 C) (Oral)  Resp 17  SpO2 92%  General:  Appears calm and comfortable, awake, alert to self only, confused Eyes: PERRL, normal lids, irises & conjunctiva ENT: grossly normal hearing, lips & tongue Neck: no LAD, masses or thyromegaly Cardiovascular: RRR, no m/r/g. No LE edema. Respiratory: CTA bilaterally, no w/r/r. Normal respiratory effort. Abdomen: soft, NT, ND, BS present Skin:  no rash or induration seen on limited exam Musculoskeletal: pain limiting passive movement of R hip, intact distal pulses Psychiatric: unable to assess Neurologic: involuntary movements of face, arms          Labs on Admission:  Basic Metabolic Panel:  Recent Labs Lab 02/08/14 1417 02/10/14 1525  NA 135 144  K 3.0* 3.1*  CL 98 103  CO2 25 27  GLUCOSE 314* 230*  BUN 16 14  CREATININE 1.4* 1.16*  CALCIUM 10.0 9.6   Liver Function Tests:  Recent Labs Lab 02/08/14 1417  AST 21  ALT 16  ALKPHOS 54  BILITOT 0.3  PROT 7.3  ALBUMIN 3.9   No results found for this basename: LIPASE, AMYLASE,  in the last 168 hours No results found for this basename: AMMONIA,  in the last 168 hours CBC:  Recent Labs Lab 02/08/14 1417 02/10/14 1525  WBC 6.3 10.0  NEUTROABS 3.9 7.5  HGB 10.9* 10.3*  HCT 32.3* 30.9*  MCV 88.5 88.0  PLT 266.0 246   Cardiac Enzymes: No results found for this basename: CKTOTAL, CKMB, CKMBINDEX, TROPONINI,  in the last 168 hours  BNP (last 3 results)  Recent Labs  05/22/13 1640 10/13/13 1305 10/16/13 0423  PROBNP 173.3 1431.0* 718.6*   CBG: No results found for this basename: GLUCAP,  in the last 168 hours  Radiological Exams on Admission: Dg Hip Complete Right  02/10/2014   CLINICAL DATA:  Fall with right hip and pelvic pain.   EXAM: RIGHT HIP - COMPLETE 2+ VIEW  COMPARISON:  12/11/2013 and 10/05/2013  FINDINGS: Exam demonstrates diffuse decreased bone mineralization. There are mild symmetric degenerative changes of the hips. There is a displaced slightly comminuted fracture of the right superior pubic ramus extending to the symphysis. There is also a minimally displaced acute right inferior pubic ramus fracture towards the ischial tuberosity. There is suggestion of an old left inferior pubic ramus fracture. There are degenerative changes of the spine.  IMPRESSION: Displaced mildly comminuted fracture of the right superior pubic ramus extending to the symphysis. Acute minimal displaced right inferior pubic ramus fracture towards the ischial tuberosity.  Old left inferior pubic ramus fracture.   Electronically Signed   By: Marin Olp M.D.   On: 02/10/2014 14:33   Ct Head Wo Contrast  02/10/2014   CLINICAL DATA:  Fall, rule out bleed.  Dementia.  EXAM: CT HEAD WITHOUT CONTRAST  TECHNIQUE: Contiguous axial images were obtained from the base of the skull through the vertex without intravenous contrast.  COMPARISON:  12/10/2013 and 10/05/2013  FINDINGS: The ventricles and cisterns are within normal. There is mild age related atrophic change which stable. Chronic ischemic microvascular disease is present. Small left frontal infarct. Possible old small right thalamus lacunar infarct. There is no mass, mass effect, shift of midline structures or acute hemorrhage. There is no definite evidence of acute infarction. Remainder the exam is unchanged. Minimal chronic sinus inflammatory disease.  IMPRESSION: No acute intracranial findings.  Age related atrophy and chronic ischemic microvascular disease. Small old left frontal infarct.  Mild chronic sinus inflammatory disease.   Electronically Signed   By: Marin Olp M.D.   On: 02/10/2014 12:17    EKG: Independently reviewed. Artifact, repeat pending  Assessment/Plan    Closed fracture of  multiple pubic rami/  Pelvic fracture -ortho consulted per Dr.Hewit -non opertaive management -pain control, Pt eval -schedule tylenol, multiple Po narcotic allergies -Dilaudid 0.5mg  for severe pain   HYPERTENSION -stable, continue amlodipine  History of CVA (cerebrovascular accident) -continue plavix    Dementia -continue namenda, antidepressants and home regimen of klonopin     Abnormal involuntary movement/tremors -workup last admission unremarkable -started on Keppra per Neuro then, continue this  DM -hold metformin, SSI for now  Chronic anemia -stable  Adult failure to thrive, advanced age, pOor Po intake, weight loss: advised family to consider Hospice if decline continues  DVt proph: lovenox  UA pending  Code Status: DNR Family Communication: d/w son at bedside Disposition Plan: Home in 1-2days with [facility]  Time spent: 52min  Moyinoluwa Dawe [facility] Pager [phone]  **Disclaimer: This note may have been dictated with voice recognition software. Similar sounding words can inadvertently be transcribed and this note may contain transcription errors which may not have been corrected upon publication of note.**

## 2014-02-10 NOTE — ED Provider Notes (Signed)
CSN: [id number]     Arrival date & time 02/10/14  1054 History   First MD Initiated Contact with Patient 02/10/14 1058     No chief complaint on file.    (Consider location/radiation/quality/duration/timing/severity/associated sxs/prior Treatment) HPI Comments: Pt comes in with cc of fall. Per son, patient has had several mechanical falls at home over the past 2 weeks. She had one more un witnessed fall today. PT recalls the fall, and there is no reports of syncope, or seizure like activity. Pt is having some right sided hip pain. No n/v/f/c. No chest pain, headaches.   The history is provided by the patient and a relative.    Past Medical History  Diagnosis Date  . COLONIC POLYPS 06/15/2005  . DIABETES MELLITUS, TYPE II 04/22/2007  . HYPERCHOLESTEROLEMIA   . HYPERLIPIDEMIA   . OBESITY   . ANEMIA-IRON DEFICIENCY   . DEPRESSION   . HYPERTENSION   . ALLERGIC RHINITIS   . ESOPHAGEAL STRICTURE   . GERD   . HIATAL HERNIA   . DIVERTICULOSIS, COLON   . BACK PAIN   . OSTEOPOROSIS   . DIZZINESS   . WEIGHT LOSS   . DYSPNEA   . Other dysphagia   . COLONIC POLYPS, HX OF 04/03/2002 & 06/15/2005    TUBULAR ADENOMA  . History of CVA (cerebrovascular accident) 07/29/2012    Noted old, to right thalamus and pons by Head CT Jul 27, 2012  . Dementia 09/17/2012  . Type II or unspecified type diabetes mellitus without mention of complication, uncontrolled 07/16/2012  . Movement disorder    Past Surgical History  Procedure Laterality Date  . Appendectomy    . Abdominal hysterectomy    . Tonsillectomy and adenoidectomy    . Left ankle surgury    . S/p bladder pubovaginal sling    . S/p cystocele/rectocele repair    . Laparoscopic takedown of incarcerated stomach within the chest/nissen  2009  . Esophageal manometry  12/21/2011    Procedure: ESOPHAGEAL MANOMETRY (EM);  Surgeon: Sable Feil, MD;  Location: [facility];  Service: Endoscopy;  Laterality: N/A;   Family History  Problem  Relation Age of Onset  . Heart disease Mother 31  . Diabetes Mother   . Heart attack Mother   . Colon cancer Father   . Diabetes Father    History  Substance Use Topics  . Smoking status: Never Smoker   . Smokeless tobacco: Never Used  . Alcohol Use: No   OB History   Grav Para Term Preterm Abortions TAB SAB Ect Mult Living                 Review of Systems  Constitutional: Negative for activity change.  Respiratory: Negative for shortness of breath.   Cardiovascular: Negative for chest pain.  Gastrointestinal: Negative for nausea, vomiting and abdominal pain.  Genitourinary: Negative for dysuria.  Musculoskeletal: Positive for arthralgias. Negative for neck pain.  Skin: Negative for wound.  Neurological: Negative for headaches.  Hematological: Does not bruise/bleed easily.      Allergies  Other; Codeine; Hydrocodone-acetaminophen; Keflex; and Oxycodone-acetaminophen  Home Medications   Prior to Admission medications   Medication Sig Start Date End Date Taking? Authorizing Provider  amLODipine (NORVASC) 10 MG tablet Take 1 tablet (10 mg total) by mouth every morning. 02/08/14  Yes Biagio Borg, MD  atorvastatin (LIPITOR) 10 MG tablet Take 1 tablet (10 mg total) by mouth every morning. 02/08/14  Yes Biagio Borg, MD  buPROPion (WELLBUTRIN) 100 MG tablet Take 1 tablet (100 mg total) by mouth daily. 02/08/14  Yes Biagio Borg, MD  cetirizine (ZYRTEC) 10 MG tablet Take 10 mg by mouth every morning.    Yes Historical Provider, MD  citalopram (CELEXA) 10 MG tablet Take 1 tablet (10 mg total) by mouth daily. 02/08/14  Yes Biagio Borg, MD  clonazePAM (KLONOPIN) 0.25 MG disintegrating tablet Take 1 tablet (0.25 mg total) by mouth 3 (three) times daily. 02/08/14  Yes Biagio Borg, MD  clopidogrel (PLAVIX) 75 MG tablet Take 1 tablet (75 mg total) by mouth daily with breakfast. 02/08/14  Yes Biagio Borg, MD  docusate sodium 100 MG CAPS Take 100 mg by mouth 2 (two) times daily. 10/09/13  Yes  Janece Canterbury, MD  ferrous sulfate 325 (65 FE) MG tablet Take 1 tablet (325 mg total) by mouth 3 (three) times daily after meals. 10/09/13  Yes Janece Canterbury, MD  furosemide (LASIX) 20 MG tablet Take 1 tablet (20 mg total) by mouth daily. 02/08/14  Yes Biagio Borg, MD  insulin glargine (LANTUS) 100 UNIT/ML injection Inject 20 Units into the skin every morning.    Yes Historical Provider, MD  levETIRAcetam (KEPPRA) 500 MG tablet Take 500-1,000 mg by mouth 2 (two) times daily. 1 am 2pm 02/08/14  Yes Biagio Borg, MD  Memantine HCl ER (NAMENDA XR) 28 MG CP24 Take 28 mg by mouth every morning. 02/08/14  Yes Biagio Borg, MD  metFORMIN (GLUCOPHAGE) 500 MG tablet Take 1 tablet (500 mg total) by mouth 2 (two) times daily with a meal. 02/08/14  Yes Biagio Borg, MD  omeprazole (PRILOSEC) 20 MG capsule Take 1 capsule (20 mg total) by mouth daily. 02/08/14  Yes Biagio Borg, MD  potassium chloride (KLOR-CON 10) 10 MEQ tablet Take 1 tablet (10 mEq total) by mouth daily. 02/08/14  Yes Biagio Borg, MD  tamsulosin (FLOMAX) 0.4 MG CAPS capsule Take 1 capsule (0.4 mg total) by mouth every morning. 02/08/14  Yes Biagio Borg, MD   BP 129/52  Pulse 82  Temp(Src) 98.1 F (36.7 C) (Axillary)  Resp 16  SpO2 93% Physical Exam  Nursing note and vitals reviewed. Constitutional: She appears well-developed and well-nourished.  HENT:  Head: Normocephalic and atraumatic.  Eyes: EOM are normal. Pupils are equal, round, and reactive to light.  Neck: Neck supple.  Cardiovascular: Normal rate, regular rhythm and normal heart sounds.   No murmur heard. Pulmonary/Chest: Effort normal. No respiratory distress.  Abdominal: Soft. She exhibits no distension. There is no tenderness. There is no rebound and no guarding.  Musculoskeletal:  PATIENT HAS RIGHT SIDED HIP TENDERNESS. Othwewise  Head to toe evaluation shows no hematoma, bleeding of the scalp, no facial abrasions, step offs, crepitus, no tenderness to palpation of the  bilateral upper and lower extremities, no gross deformities, no chest tenderness, no pelvic pain.   Neurological: She is alert.  Skin: Skin is warm and dry.    ED Course  Procedures (including critical care time) Labs Review Labs Reviewed  BASIC METABOLIC PANEL - Abnormal; Notable for the following:    Potassium 3.1 (*)    Glucose, Bld 230 (*)    Creatinine, Ser 1.16 (*)    GFR calc non Af Amer 43 (*)    GFR calc Af Amer 50 (*)    All other components within normal limits  CBC WITH DIFFERENTIAL - Abnormal; Notable for the following:    RBC 3.51 (*)  Hemoglobin 10.3 (*)    HCT 30.9 (*)    All other components within normal limits  URINALYSIS, ROUTINE W REFLEX MICROSCOPIC  PROTIME-INR  TYPE AND SCREEN  ABO/RH    Imaging Review Dg Hip Complete Right  02/10/2014   CLINICAL DATA:  Fall with right hip and pelvic pain.  EXAM: RIGHT HIP - COMPLETE 2+ VIEW  COMPARISON:  12/11/2013 and 10/05/2013  FINDINGS: Exam demonstrates diffuse decreased bone mineralization. There are mild symmetric degenerative changes of the hips. There is a displaced slightly comminuted fracture of the right superior pubic ramus extending to the symphysis. There is also a minimally displaced acute right inferior pubic ramus fracture towards the ischial tuberosity. There is suggestion of an old left inferior pubic ramus fracture. There are degenerative changes of the spine.  IMPRESSION: Displaced mildly comminuted fracture of the right superior pubic ramus extending to the symphysis. Acute minimal displaced right inferior pubic ramus fracture towards the ischial tuberosity.  Old left inferior pubic ramus fracture.   Electronically Signed   By: Marin Olp M.D.   On: 02/10/2014 14:33   Ct Head Wo Contrast  02/10/2014   CLINICAL DATA:  Fall, rule out bleed.  Dementia.  EXAM: CT HEAD WITHOUT CONTRAST  TECHNIQUE: Contiguous axial images were obtained from the base of the skull through the vertex without intravenous  contrast.  COMPARISON:  12/10/2013 and 10/05/2013  FINDINGS: The ventricles and cisterns are within normal. There is mild age related atrophic change which stable. Chronic ischemic microvascular disease is present. Small left frontal infarct. Possible old small right thalamus lacunar infarct. There is no mass, mass effect, shift of midline structures or acute hemorrhage. There is no definite evidence of acute infarction. Remainder the exam is unchanged. Minimal chronic sinus inflammatory disease.  IMPRESSION: No acute intracranial findings.  Age related atrophy and chronic ischemic microvascular disease. Small old left frontal infarct.  Mild chronic sinus inflammatory disease.   Electronically Signed   By: Marin Olp M.D.   On: 02/10/2014 12:17     EKG Interpretation   Date/Time:  Saturday February 10 2014 11:08:10 EDT Ventricular Rate:  86 PR Interval:    QRS Duration: 98 QT Interval:  343 QTC Calculation: 410 R Axis:   -57 Text Interpretation:  Artifact Confirmed by Kathrynn Humble, MD, Thelma Comp [phone]) on  02/10/2014 3:29:51 PM      MDM   Final diagnoses:  Fall  Contusion  Fracture of multiple pubic rami    DDx includes: - Mechanical falls - ICH - Fractures - Contusions - Soft tissue injury  Pt has a pubic rami fracture from the fall. Admit for pain control. Ortho to see - Dr. Doran Durand. Likely non operative case.   Varney Biles, MD 02/10/14 1746

## 2014-02-10 NOTE — ED Notes (Signed)
Pt from home, c/o fall un witnessed. Per EMS, Son stated frequent falls since being home. Pt was found on floor after son left room. Pt alert baseline dementia. Aware of situation. EMS states some tenderness to thoracic area and right groin pain

## 2014-02-10 NOTE — ED Notes (Signed)
Patient transported to X-ray 

## 2014-02-10 NOTE — Consult Note (Signed)
Reason for Consult:right groin pain Referring Physician: Dr. [name] is an 80 y.o. female.  HPI: 80 y/o female with PMH of dementia and diabetes fell earlier today on her right side.  She walks only with assistance and spends much of her time in bed.  She c/o aching pain in the right groin.  It's moderate at rest and becomes severe with motion.  She denies any pain elsewhere.  Son and granddaughter at bedside.  She is not a smoker.  Past Medical History  Diagnosis Date  . COLONIC POLYPS 06/15/2005  . DIABETES MELLITUS, TYPE II 04/22/2007  . HYPERCHOLESTEROLEMIA   . HYPERLIPIDEMIA   . OBESITY   . ANEMIA-IRON DEFICIENCY   . DEPRESSION   . HYPERTENSION   . ALLERGIC RHINITIS   . ESOPHAGEAL STRICTURE   . GERD   . HIATAL HERNIA   . DIVERTICULOSIS, COLON   . BACK PAIN   . OSTEOPOROSIS   . DIZZINESS   . WEIGHT LOSS   . DYSPNEA   . Other dysphagia   . COLONIC POLYPS, HX OF 04/03/2002 & 06/15/2005    TUBULAR ADENOMA  . History of CVA (cerebrovascular accident) 07/29/2012    Noted old, to right thalamus and pons by Head CT Jul 27, 2012  . Dementia 09/17/2012  . Type II or unspecified type diabetes mellitus without mention of complication, uncontrolled 07/16/2012  . Movement disorder     Past Surgical History  Procedure Laterality Date  . Appendectomy    . Abdominal hysterectomy    . Tonsillectomy and adenoidectomy    . Left ankle surgury    . S/p bladder pubovaginal sling    . S/p cystocele/rectocele repair    . Laparoscopic takedown of incarcerated stomach within the chest/nissen  2009  . Esophageal manometry  12/21/2011    Procedure: ESOPHAGEAL MANOMETRY (EM);  Surgeon: Sable Feil, MD;  Location: [facility];  Service: Endoscopy;  Laterality: N/A;    Family History  Problem Relation Age of Onset  . Heart disease Mother 21  . Diabetes Mother   . Heart attack Mother   . Colon cancer Father   . Diabetes Father     Social History:  reports that she has  never smoked. She has never used smokeless tobacco. She reports that she does not drink alcohol or use illicit drugs.  Allergies:  Allergies  Allergen Reactions  . Other Nausea And Vomiting    ANESTHESIA AND STRONG PAIN MEDS-VERY SENSITIVE  . Codeine Nausea And Vomiting and Rash  . Hydrocodone-Acetaminophen Nausea And Vomiting and Rash  . Keflex [Cephalexin] Other (See Comments)    Pt and her son do not know what the reaction is to this medication  . Oxycodone-Acetaminophen Nausea And Vomiting and Rash    Medications: I have reviewed the patient's current medications.  Results for orders placed during the hospital encounter of 02/10/14 (from the past 48 hour(s))  BASIC METABOLIC PANEL     Status: Abnormal   Collection Time    02/10/14  3:25 PM      Result Value Ref Range   Sodium 144  137 - 147 mEq/L   Potassium 3.1 (*) 3.7 - 5.3 mEq/L   Chloride 103  96 - 112 mEq/L   CO2 27  19 - 32 mEq/L   Glucose, Bld 230 (*) 70 - 99 mg/dL   BUN 14  6 - 23 mg/dL   Creatinine, Ser 1.16 (*) 0.50 - 1.10 mg/dL   Calcium  9.6  8.4 - 10.5 mg/dL   GFR calc non Af Amer 43 (*) >90 mL/min   GFR calc Af Amer 50 (*) >90 mL/min   Comment: (NOTE)     The eGFR has been calculated using the CKD EPI equation.     This calculation has not been validated in all clinical situations.     eGFR's persistently <90 mL/min signify possible Chronic Kidney     Disease.  CBC WITH DIFFERENTIAL     Status: Abnormal   Collection Time    02/10/14  3:25 PM      Result Value Ref Range   WBC 10.0  4.0 - 10.5 K/uL   RBC 3.51 (*) 3.87 - 5.11 MIL/uL   Hemoglobin 10.3 (*) 12.0 - 15.0 g/dL   HCT 30.9 (*) 36.0 - 46.0 %   MCV 88.0  78.0 - 100.0 fL   MCH 29.3  26.0 - 34.0 pg   MCHC 33.3  30.0 - 36.0 g/dL   RDW 13.3  11.5 - 15.5 %   Platelets 246  150 - 400 K/uL   Neutrophils Relative % 75  43 - 77 %   Neutro Abs 7.5  1.7 - 7.7 K/uL   Lymphocytes Relative 15  12 - 46 %   Lymphs Abs 1.5  0.7 - 4.0 K/uL   Monocytes Relative  8  3 - 12 %   Monocytes Absolute 0.8  0.1 - 1.0 K/uL   Eosinophils Relative 2  0 - 5 %   Eosinophils Absolute 0.2  0.0 - 0.7 K/uL   Basophils Relative 0  0 - 1 %   Basophils Absolute 0.0  0.0 - 0.1 K/uL  TYPE AND SCREEN     Status: None   Collection Time    02/10/14  3:36 PM      Result Value Ref Range   ABO/RH(D) A POS     Antibody Screen NEG     Sample Expiration 02/13/2014    ABO/RH     Status: None   Collection Time    02/10/14  3:36 PM      Result Value Ref Range   ABO/RH(D) A POS      Dg Hip Complete Right  02/10/2014   CLINICAL DATA:  Fall with right hip and pelvic pain.  EXAM: RIGHT HIP - COMPLETE 2+ VIEW  COMPARISON:  12/11/2013 and 10/05/2013  FINDINGS: Exam demonstrates diffuse decreased bone mineralization. There are mild symmetric degenerative changes of the hips. There is a displaced slightly comminuted fracture of the right superior pubic ramus extending to the symphysis. There is also a minimally displaced acute right inferior pubic ramus fracture towards the ischial tuberosity. There is suggestion of an old left inferior pubic ramus fracture. There are degenerative changes of the spine.  IMPRESSION: Displaced mildly comminuted fracture of the right superior pubic ramus extending to the symphysis. Acute minimal displaced right inferior pubic ramus fracture towards the ischial tuberosity.  Old left inferior pubic ramus fracture.   Electronically Signed   By: Marin Olp M.D.   On: 02/10/2014 14:33   Ct Head Wo Contrast  02/10/2014   CLINICAL DATA:  Fall, rule out bleed.  Dementia.  EXAM: CT HEAD WITHOUT CONTRAST  TECHNIQUE: Contiguous axial images were obtained from the base of the skull through the vertex without intravenous contrast.  COMPARISON:  12/10/2013 and 10/05/2013  FINDINGS: The ventricles and cisterns are within normal. There is mild age related atrophic change which stable. Chronic ischemic microvascular  disease is present. Small left frontal infarct. Possible old  small right thalamus lacunar infarct. There is no mass, mass effect, shift of midline structures or acute hemorrhage. There is no definite evidence of acute infarction. Remainder the exam is unchanged. Minimal chronic sinus inflammatory disease.  IMPRESSION: No acute intracranial findings.  Age related atrophy and chronic ischemic microvascular disease. Small old left frontal infarct.  Mild chronic sinus inflammatory disease.   Electronically Signed   By: Marin Olp M.D.   On: 02/10/2014 12:17    ROS:  No recent f/c/n/v/wt loss PE:  Blood pressure 155/40, pulse 82, temperature 98.1 F (36.7 C), temperature source Oral, resp. rate 17, SpO2 92.00%. wn wd elderly woman in nad.  A and O x 1.  Mood normal.  Affect flat.  EOMI.  resp unlabored.  R LE without gross deformity.  TTP at superior pubic ramus.  No pain with IR and ER lf the hip.  No lymhpadenopathy.  Skin healthy and intact.  1+ dp and pt pulses.  Feels LT in the foot.  Assessment/Plan: Right superior and inferior pubic ramus fractures - I explained the nature of these deformities to th epatient and her family in detail.  She can bear weight as tolerated.  i believe these fractures can be treated successfully with pain control and PT.  No indication for surgery at this time.  She will be admitted by [facility].  I'll follow.  Wylene Simmer 02/10/2014, 5:12 PM

## 2014-02-10 NOTE — ED Notes (Signed)
MD at bedside. 

## 2014-02-10 NOTE — ED Notes (Signed)
IV team paged.  

## 2014-02-11 LAB — GLUCOSE, CAPILLARY
GLUCOSE-CAPILLARY: 395 mg/dL — AB (ref 70–99)
Glucose-Capillary: 151 mg/dL — ABNORMAL HIGH (ref 70–99)
Glucose-Capillary: 357 mg/dL — ABNORMAL HIGH (ref 70–99)
Glucose-Capillary: 95 mg/dL (ref 70–99)
Glucose-Capillary: 371 mg/dL — ABNORMAL HIGH (ref 70–99)

## 2014-02-11 MED ORDER — ACETAMINOPHEN 500 MG PO TABS: 500.0000 mg | ORAL_TABLET | Freq: Three times a day (TID) | ORAL | Status: DC

## 2014-02-11 NOTE — Discharge Instructions (Signed)
Bear weight as you tolerate with a walker and assistance.

## 2014-02-11 NOTE — Progress Notes (Addendum)
[facility] PROGRESS NOTE  [name] MAU:[id number] DOB: 1930/11/18 DOA: 02/10/2014 PCP: Cathlean Cower, MD  Assessment/Plan: Closed fracture of multiple pubic rami/ Pelvic fracture  -Seen by ortho consult with Dr. Doran Durand and recommends non opertaive management  -pain contro and PT eval with weight bearing as tolerated. . May need SNF -schedule tylenol, issues as listed to multiple narcotics. -prn low dose Dilaudid 0.5mg  for severe pain.  HYPERTENSION  -stable, continue amlodipine   History of CVA (cerebrovascular accident)  -continue plavix   Dementia  -continue namenda, antidepressants and home regimen of klonopin   Abnormal involuntary movement/tremors  -workup last admission unremarkable  -started on Keppra for this by neurology.   DM  -hold metformin, SSI for now   Chronic anemia  -stable  Hypokalemia  replenish kcl   Code Status: DO NOT RESUSCITATE Family Communication: None at bedside  Disposition Plan: pending PT eval.    Consultants:  orthopedics  Procedures:  none   Antibiotics: none  HPI/Subjective: Reports pain in the hip to be stable at rest but limited ROM due to pain rt>left hip  Objective: Filed Vitals:   02/11/14 0936  BP: 154/65  Pulse: 86  Temp:   Resp:     Intake/Output Summary (Last 24 hours) at 02/11/14 1046 Last data filed at 02/11/14 0945  Gross per 24 hour  Intake 1838.75 ml  Output   1050 ml  Net 788.75 ml   There were no vitals filed for this visit.  Exam:   General:  Alert female in no acute distress  HEENT: No pallor, facial tremors  Cardiovascular: Normal S1 and S2, no murmurs or gallop  Respiratory: Clear to auscultation bilaterally  Abdomen: Soft, nontender, nondistended, bowel sounds present  Musculoskeletal: Warm , No edema , Limited upper limb of the hips R>L for pain  CNS: AAO X2  Data Reviewed: Basic Metabolic Panel:  Recent Labs Lab 02/08/14 1417 02/10/14 1525  NA 135 144  K  3.0* 3.1*  CL 98 103  CO2 25 27  GLUCOSE 314* 230*  BUN 16 14  CREATININE 1.4* 1.16*  CALCIUM 10.0 9.6   Liver Function Tests:  Recent Labs Lab 02/08/14 1417  AST 21  ALT 16  ALKPHOS 54  BILITOT 0.3  PROT 7.3  ALBUMIN 3.9   No results found for this basename: LIPASE, AMYLASE,  in the last 168 hours No results found for this basename: AMMONIA,  in the last 168 hours CBC:  Recent Labs Lab 02/08/14 1417 02/10/14 1525  WBC 6.3 10.0  NEUTROABS 3.9 7.5  HGB 10.9* 10.3*  HCT 32.3* 30.9*  MCV 88.5 88.0  PLT 266.0 246   Cardiac Enzymes: No results found for this basename: CKTOTAL, CKMB, CKMBINDEX, TROPONINI,  in the last 168 hours BNP (last 3 results)  Recent Labs  05/22/13 1640 10/13/13 1305 10/16/13 0423  PROBNP 173.3 1431.0* 718.6*   CBG:  Recent Labs Lab 02/10/14 1830 02/10/14 2217 02/10/14 2218 02/11/14 0641  GLUCAP 207* 363* 363* 151*    No results found for this or any previous visit (from the past 240 hour(s)).   Studies: Dg Hip Complete Right  02/10/2014   CLINICAL DATA:  Fall with right hip and pelvic pain.  EXAM: RIGHT HIP - COMPLETE 2+ VIEW  COMPARISON:  12/11/2013 and 10/05/2013  FINDINGS: Exam demonstrates diffuse decreased bone mineralization. There are mild symmetric degenerative changes of the hips. There is a displaced slightly comminuted fracture of the right superior pubic ramus extending to the  symphysis. There is also a minimally displaced acute right inferior pubic ramus fracture towards the ischial tuberosity. There is suggestion of an old left inferior pubic ramus fracture. There are degenerative changes of the spine.  IMPRESSION: Displaced mildly comminuted fracture of the right superior pubic ramus extending to the symphysis. Acute minimal displaced right inferior pubic ramus fracture towards the ischial tuberosity.  Old left inferior pubic ramus fracture.   Electronically Signed   By: Marin Olp M.D.   On: 02/10/2014 14:33   Ct Head  Wo Contrast  02/10/2014   CLINICAL DATA:  Fall, rule out bleed.  Dementia.  EXAM: CT HEAD WITHOUT CONTRAST  TECHNIQUE: Contiguous axial images were obtained from the base of the skull through the vertex without intravenous contrast.  COMPARISON:  12/10/2013 and 10/05/2013  FINDINGS: The ventricles and cisterns are within normal. There is mild age related atrophic change which stable. Chronic ischemic microvascular disease is present. Small left frontal infarct. Possible old small right thalamus lacunar infarct. There is no mass, mass effect, shift of midline structures or acute hemorrhage. There is no definite evidence of acute infarction. Remainder the exam is unchanged. Minimal chronic sinus inflammatory disease.  IMPRESSION: No acute intracranial findings.  Age related atrophy and chronic ischemic microvascular disease. Small old left frontal infarct.  Mild chronic sinus inflammatory disease.   Electronically Signed   By: Marin Olp M.D.   On: 02/10/2014 12:17   Dg Chest Port 1 View  02/10/2014   CLINICAL DATA:  Pneumonia  EXAM: PORTABLE CHEST - 1 VIEW  COMPARISON:  February 08, 2014  FINDINGS: The lungs are clear. Heart size and pulmonary vascularity are normal. No adenopathy. No bone lesions.  IMPRESSION: No edema or consolidation.   Electronically Signed   By: Lowella Grip M.D.   On: 02/10/2014 18:40    Scheduled Meds: . acetaminophen  500 mg Oral TID  . amLODipine  10 mg Oral q morning - 10a  . atorvastatin  10 mg Oral q morning - 10a  . buPROPion  100 mg Oral Daily  . cefTRIAXone (ROCEPHIN)  IV  1 g Intravenous Q24H  . citalopram  10 mg Oral Daily  . clonazePAM  0.25 mg Oral TID  . clopidogrel  75 mg Oral Q breakfast  . docusate sodium  100 mg Oral BID  . enoxaparin (LOVENOX) injection  40 mg Subcutaneous Q24H  . furosemide  20 mg Oral Daily  . insulin aspart  0-5 Units Subcutaneous QHS  . insulin aspart  0-9 Units Subcutaneous TID WC  . insulin glargine  20 Units Subcutaneous q morning  - 10a  . levETIRAcetam  1,000 mg Oral QHS  . levETIRAcetam  500 mg Oral Daily  . Memantine HCl ER  1 capsule Oral q morning - 10a  . pantoprazole  40 mg Oral Daily  . potassium chloride  10 mEq Oral Daily  . tamsulosin  0.4 mg Oral q morning - 10a   Continuous Infusions: . sodium chloride 1,000 mL (02/11/14 0600)      Time spent: 25 minutes    Jerelle Virden  [facility] Pager [phone] If 7PM-7AM, please contact night-coverage at [email or web address], password [facility] 02/11/2014, 10:46 AM  LOS: 1 day

## 2014-02-11 NOTE — Progress Notes (Signed)
Clinical Education officer, museum (CSW) contacted patient's daughter in law Gail who reported that patient has used all her medicare SNF days at [facility] in [place]. Jenny Reichmann stated that patient was D/C'ed from [facility] two weeks ago and family has been providing around the clock care for patient. Jenny Reichmann reported that CNA's come to the patient's house 3 days a week and patient's son does not work right now and cares for patient. Jenny Reichmann stated that the primary care MD has ordered a hospital bed that will be delivered on Monday. Jenny Reichmann stated that patient does not qualify for medicaid and they are trying to keep her home as long as they can. Please reconsult if [facility] social work needs arise. CSW signing off.   Blima Rich, [place] Weekend CSW [phone]

## 2014-02-11 NOTE — Evaluation (Signed)
Physical Therapy Evaluation Patient Details Name: [name] MRN: [id number] DOB: [birth year] Today's Date: 02/11/2014   History of Present Illness  Pt fell getting out of bed by herself sustaining pelvic fx.  Clinical Impression  Pt admitted with pelvic fx. Pt currently with functional limitations due to the deficits listed below (see PT Problem List). Per chart, family reports pt has had steady decline in mobility. Pt will benefit from skilled PT to increase their independence and safety with mobility to allow discharge to the venue listed below.       Follow Up Recommendations SNF;Supervision/Assistance - 24 hour    Equipment Recommendations  None recommended by PT    Recommendations for Other Services       Precautions / Restrictions Precautions Precautions: Fall Restrictions Weight Bearing Restrictions: Yes RLE Weight Bearing: Weight bearing as tolerated LLE Weight Bearing: Weight bearing as tolerated      Mobility  Bed Mobility Overal bed mobility: Needs Assistance Bed Mobility: Supine to Sit;Sit to Supine     Supine to sit: Mod assist Sit to supine: Mod assist   General bed mobility comments: verbal/tactile cues for sequencing  Transfers Overall transfer level: Needs assistance Equipment used: Rolling walker (2 wheeled) Transfers: Sit to/from [facility] Sit to Stand: Mod assist Stand pivot transfers: Mod assist       General transfer comment: continuous verbal cues; increased time to complete; decreased safety/impulsive (possibly due to pain response)  Ambulation/Gait                Stairs            Wheelchair Mobility    Modified Rankin (Stroke Patients Only)       Balance                                             Pertinent Vitals/Pain     Home Living Family/patient expects to be discharged to:: Private residence Living Arrangements: Children Available Help at Discharge: Family Type  of Home: House Home Access: Stairs to enter Entrance Stairs-Rails: Left Entrance Stairs-Number of Steps: 7 Home Layout: Two level;Able to live on main level with bedroom/bathroom Home Equipment: Gilford Rile - 2 wheels;Cane - single point      Prior Function Level of Independence: Independent               Hand Dominance   Dominant Hand: Right    Extremity/Trunk Assessment                         Communication   Communication: Expressive difficulties  Cognition Arousal/Alertness: Awake/alert Behavior During Therapy: WFL for tasks assessed/performed Overall Cognitive Status: History of cognitive impairments - at baseline       Memory: Decreased recall of precautions;Decreased short-term memory              General Comments      Exercises        Assessment/Plan    PT Assessment Patient needs continued PT services  PT Diagnosis Difficulty walking;Generalized weakness;Acute pain   PT Problem List Decreased strength;Decreased balance;Decreased mobility;Decreased cognition;Decreased knowledge of use of DME;Decreased safety awareness;Pain;Decreased knowledge of precautions  PT Treatment Interventions DME instruction;Gait training;Functional mobility training;Therapeutic activities;Therapeutic exercise;Balance training;Patient/family education   PT Goals (Current goals can be found in the Care Plan section) Acute Rehab PT Goals Patient  Stated Goal: unable to state PT Goal Formulation: Patient unable to participate in goal setting Time For Goal Achievement: 02/25/14 Potential to Achieve Goals: Fair    Frequency Min 2X/week   Barriers to discharge        Co-evaluation               End of Session Equipment Utilized During Treatment: Gait belt Activity Tolerance: Patient tolerated treatment well Patient left: in bed;with call bell/phone within reach;with bed alarm set (no chair alarm pads available, so pt returned to bed) Nurse Communication:  Mobility status         Time: 1024-1100 PT Time Calculation (min): 36 min   Charges:   PT Evaluation $Initial PT Evaluation Tier I: 1 Procedure PT Treatments $Therapeutic Activity: 23-37 mins   PT G Codes:          Philippa Sicks 02/11/2014, 1:13 PM  Lorrin Goodell, PT  Office # [phone] Pager [phone]

## 2014-02-11 NOTE — Progress Notes (Signed)
Subjective:    Pt c/o some right sided groin pain.  Sitting up eating breakfast comfortably this morning. Patient reports pain as mild.    Objective: Vital signs in last 24 hours: Temp:  [97.6 F (36.4 C)-98.1 F (36.7 C)] 97.6 F (36.4 C) (06/07 0556) Pulse Rate:  [80-84] 82 (06/07 0556) Resp:  [15-20] 18 (06/07 0556) BP: (129-173)/(40-89) 155/69 mmHg (06/07 0556) SpO2:  [92 %-100 %] 100 % (06/07 0556)  Intake/Output from previous day: 06/06 0701 - 06/07 0700 In: 1598.8 [P.O.:660; I.V.:938.8] Out: 1050 [Urine:1050] Intake/Output this shift:     Recent Labs  02/08/14 1417 02/10/14 1525  HGB 10.9* 10.3*    Recent Labs  02/08/14 1417 02/10/14 1525  WBC 6.3 10.0  RBC 3.65* 3.51*  HCT 32.3* 30.9*  PLT 266.0 246    Recent Labs  02/08/14 1417 02/10/14 1525  NA 135 144  K 3.0* 3.1*  CL 98 103  CO2 25 27  BUN 16 14  CREATININE 1.4* 1.16*  GLUCOSE 314* 230*  CALCIUM 10.0 9.6   No results found for this basename: LABPT, INR,  in the last 72 hours  PE:  elderly woman in nad.  A and O x 1.  R LE NVI.  Assessment/Plan:     Up with therapy  WBAT on B LEs.  Pain control and PT for mobilization.  F/u with me in the office in a month.  I'll sign off.  Please call [phone] with a ny questions.  Wylene Simmer 02/11/2014, 7:47 AM

## 2014-02-12 ENCOUNTER — Ambulatory Visit: Payer: Self-pay | Admitting: Nurse Practitioner

## 2014-02-12 DIAGNOSIS — E876 Hypokalemia: Secondary | ICD-10-CM | POA: Diagnosis present

## 2014-02-12 DIAGNOSIS — N390 Urinary tract infection, site not specified: Secondary | ICD-10-CM

## 2014-02-12 LAB — BASIC METABOLIC PANEL
BUN: 14 mg/dL (ref 6–23)
CO2: 27 mEq/L (ref 19–32)
Calcium: 9.1 mg/dL (ref 8.4–10.5)
Chloride: 103 mEq/L (ref 96–112)
Creatinine, Ser: 0.97 mg/dL (ref 0.50–1.10)
GFR calc Af Amer: 62 mL/min — ABNORMAL LOW (ref >90)
GFR calc non Af Amer: 53 mL/min — ABNORMAL LOW (ref >90)
GLUCOSE: 82 mg/dL (ref 70–99)
POTASSIUM: 3.3 meq/L — AB (ref 3.7–5.3)
Sodium: 143 mEq/L (ref 137–147)

## 2014-02-12 LAB — GLUCOSE, CAPILLARY
GLUCOSE-CAPILLARY: 149 mg/dL — AB (ref 70–99)
GLUCOSE-CAPILLARY: 80 mg/dL (ref 70–99)
Glucose-Capillary: 83 mg/dL (ref 70–99)
Glucose-Capillary: 119 mg/dL — ABNORMAL HIGH (ref 70–99)

## 2014-02-12 MED ORDER — POTASSIUM CHLORIDE ER 10 MEQ PO TBCR
20.0000 meq | EXTENDED_RELEASE_TABLET | Freq: Every day | ORAL | Status: DC
  Administered 2014-02-13: 20 meq via ORAL
  Filled 2014-02-12: qty 2

## 2014-02-12 MED ORDER — GLUCERNA SHAKE PO LIQD
237.0000 mL | Freq: Two times a day (BID) | ORAL | Status: DC
  Administered 2014-02-12 – 2014-02-13 (×2): 237 mL via ORAL

## 2014-02-12 MED ORDER — POTASSIUM CHLORIDE CRYS ER 20 MEQ PO TBCR
40.0000 meq | EXTENDED_RELEASE_TABLET | Freq: Once | ORAL | Status: AC
  Administered 2014-02-12: 40 meq via ORAL
  Filled 2014-02-12: qty 2

## 2014-02-12 NOTE — Progress Notes (Signed)
Utilization review completed.  

## 2014-02-12 NOTE — Progress Notes (Addendum)
INITIAL NUTRITION ASSESSMENT  Patient meets the criteria for severe MALNUTRITION in the context of chronic illness with 13-16% weight loss in 5 months and PO intake <75% of estimated needs and moderate upper body subcutaneous fat and muscle wasting.    DOCUMENTATION CODES Per approved criteria  -Severe malnutrition in the context of chronic illness   INTERVENTION: - Glucerna shakes BID, each provides 220 kcal, 10 g protein - Encourage intake of at least 50% of meals.   NUTRITION DIAGNOSIS: Unintentional weight loss related to decreased oral intake as evidenced by 13-16% weight loss in 5 months.   Goal: Patient will meet >/=90% of estimated nutrition needs  Monitor:  PO intake, weight, labs  Reason for Assessment: Malnutrition screening tool  80 y.o. female  Admitting Dx: Pelvic fracture  ASSESSMENT: 80 year old female with history of moderate dementia, CVA, DM, HTN, currently living with her son with [facility] services. She suffered an unwitnessed fall and admitted with right pelvic fracture.   -No family present in the room, but per chart review, patient with recent history of poor PO intake and weight loss. Patient reports no changes in appetite, stating that she usually doesn't eat that much. She is eating 50% of meals.  -Patient reports that she eats 3 small meals daily and snacks occasionally between meals. -She has lost 13-16% of her usual weight in 5 months.  -Patient requiring non-operative management of pelvic fracture.   Nutrition Focused Physical Exam:  Subcutaneous Fat:  Orbital Region: moderate wasting Upper Arm Region: moderate wasting Thoracic and Lumbar Region: N/A  Muscle:  Temple Region: moderate wasting Clavicle Bone Region: mild wasting Clavicle and Acromion Bone Region: moderate wasting Scapular Bone Region: N/A Dorsal Hand: Moderate wasting Patellar Region: N/A Anterior Thigh Region: N/A Posterior Calf Region: N/A  Edema: not  present    Height: Ht Readings from Last 1 Encounters:  12/10/13 5\' 3"  (1.6 m)    Weight: Wt Readings from Last 1 Encounters:  02/08/14 122 lb (55.339 kg)    Ideal Body Weight: 115 pounds  % Ideal Body Weight: 106%  Wt Readings from Last 10 Encounters:  02/08/14 122 lb (55.339 kg)  12/10/13 148 lb 11.2 oz (67.45 kg)  10/17/13 132 lb 11.5 oz (60.2 kg)  10/05/13 140 lb 6.4 oz (63.685 kg)  10/04/13 144 lb (65.318 kg)  07/27/13 145 lb 8 oz (65.998 kg)  06/15/13 144 lb (65.318 kg)  06/02/13 142 lb (64.411 kg)  05/22/13 137 lb 12.8 oz (62.506 kg)  04/06/13 144 lb 4 oz (65.431 kg)    Usual Body Weight: 140-145 pounds  % Usual Body Weight: 84-87%  BMI:  21.6 kg/m2. Patient is normal weight.   Estimated Nutritional Needs: Kcal: 1300-1400 kcal Protein: 65-75 g Fluid: >1.6 L/day  Skin: Intact  Diet Order: Carb Control, 50%  EDUCATION NEEDS: -No education needs identified at this time   Intake/Output Summary (Last 24 hours) at 02/12/14 1356 Last data filed at 02/12/14 1143  Gross per 24 hour  Intake   2200 ml  Output   5050 ml  Net  -2850 ml    Last BM: PTA   Labs:   Recent Labs Lab 02/08/14 1417 02/10/14 1525 02/12/14 0449  NA 135 144 143  K 3.0* 3.1* 3.3*  CL 98 103 103  CO2 25 27 27   BUN 16 14 14   CREATININE 1.4* 1.16* 0.97  CALCIUM 10.0 9.6 9.1  GLUCOSE 314* 230* 82    CBG (last 3)   Recent  Labs  02/11/14 2129 02/12/14 0641 02/12/14 1107  GLUCAP 371* 83 149*    Scheduled Meds: . acetaminophen  500 mg Oral TID  . amLODipine  10 mg Oral q morning - 10a  . atorvastatin  10 mg Oral q morning - 10a  . buPROPion  100 mg Oral Daily  . cefTRIAXone (ROCEPHIN)  IV  1 g Intravenous Q24H  . citalopram  10 mg Oral Daily  . clonazePAM  0.25 mg Oral TID  . clopidogrel  75 mg Oral Q breakfast  . docusate sodium  100 mg Oral BID  . enoxaparin (LOVENOX) injection  40 mg Subcutaneous Q24H  . furosemide  20 mg Oral Daily  . insulin aspart  0-5  Units Subcutaneous QHS  . insulin aspart  0-9 Units Subcutaneous TID WC  . insulin glargine  20 Units Subcutaneous q morning - 10a  . levETIRAcetam  1,000 mg Oral QHS  . levETIRAcetam  500 mg Oral Daily  . Memantine HCl ER  1 capsule Oral q morning - 10a  . pantoprazole  40 mg Oral Daily  . [START ON 02/13/2014] potassium chloride  20 mEq Oral Daily  . tamsulosin  0.4 mg Oral q morning - 10a    Continuous Infusions: . sodium chloride 1,000 mL (02/11/14 1625)    Past Medical History  Diagnosis Date  . COLONIC POLYPS 06/15/2005  . DIABETES MELLITUS, TYPE II 04/22/2007  . HYPERCHOLESTEROLEMIA   . HYPERLIPIDEMIA   . OBESITY   . ANEMIA-IRON DEFICIENCY   . DEPRESSION   . HYPERTENSION   . ALLERGIC RHINITIS   . ESOPHAGEAL STRICTURE   . GERD   . HIATAL HERNIA   . DIVERTICULOSIS, COLON   . BACK PAIN   . OSTEOPOROSIS   . DIZZINESS   . WEIGHT LOSS   . DYSPNEA   . Other dysphagia   . COLONIC POLYPS, HX OF 04/03/2002 & 06/15/2005    TUBULAR ADENOMA  . History of CVA (cerebrovascular accident) 07/29/2012    Noted old, to right thalamus and pons by Head CT Jul 27, 2012  . Dementia 09/17/2012  . Type II or unspecified type diabetes mellitus without mention of complication, uncontrolled 07/16/2012  . Movement disorder     Past Surgical History  Procedure Laterality Date  . Appendectomy    . Abdominal hysterectomy    . Tonsillectomy and adenoidectomy    . Left ankle surgury    . S/p bladder pubovaginal sling    . S/p cystocele/rectocele repair    . Laparoscopic takedown of incarcerated stomach within the chest/nissen  2009  . Esophageal manometry  12/21/2011    Procedure: ESOPHAGEAL MANOMETRY (EM);  Surgeon: Sable Feil, MD;  Location: [facility];  Service: Endoscopy;  Laterality: N/A;    Larey Seat, RD, LDN Pager #: [phone] After-Hours Pager #: [phone]

## 2014-02-12 NOTE — Progress Notes (Addendum)
[facility] PROGRESS NOTE  [name] YFV:[id number] DOB: 1930-09-26 DOA: 02/10/2014 PCP: Julia Cower, MD   Brief narrative  80 y/o Female with history of moderate dementia, CVA, history of involuntary movements, diabetes mellitus, hypertension and anxiety, bedbound at baseline presented with a fall with closed pelvic fracture.  Assessment/Plan: Closed fracture of multiple pubic rami/ Pelvic fracture  -Seen by ortho consult with Dr. Doran [name] and recommends non opertaive management  -pain contro and PT eval with weight bearing as tolerated. .PT recommend SNF -schedule tylenol, -prn low dose Dilaudid 0.5mg  for severe pain.  UTI Urine culture grew Escherichia coli. On empiric rocephin. Follow sensitivities.  HYPERTENSION  -stable, continue amlodipine   History of CVA (cerebrovascular accident)  -continue plavix   Dementia  -continue namenda, antidepressants and home regimen of klonopin   Abnormal involuntary movement/tremors  -workup last admission unremarkable  -started on Keppra for this by neurology.   DM  -hold metformin, SSI for now   Chronic anemia  -stable  Hypokalemia  replenish kcl   Code Status: DO NOT RESUSCITATE Family Communication: None at bedside today Disposition Plan: SNF   Consultants:  orthopedics  Procedures:  none   Antibiotics: none  HPI/Subjective: Still has hip pain but seems slightly improved today  Objective: Filed Vitals:   02/12/14 1242  BP: 143/85  Pulse: 79  Temp: 97.9 F (36.6 C)  Resp: 18    Intake/Output Summary (Last 24 hours) at 02/12/14 1351 Last data filed at 02/12/14 1143  Gross per 24 hour  Intake   2200 ml  Output   5050 ml  Net  -2850 ml   There were no vitals filed for this visit.  Exam:   General:  Alert female in no acute distress  HEENT: No pallor, facial tremors  Cardiovascular: Normal S1 and S2, no murmurs or gallop  Respiratory: Clear to auscultation bilaterally  Abdomen:  Soft, nontender, nondistended, bowel sounds present  Musculoskeletal: Warm , No edema , Limited upper limb of the hips R>L for pain, foley in place  CNS: AAO X2  Data Reviewed: Basic Metabolic Panel:  Recent Labs Lab 02/08/14 1417 02/10/14 1525 02/12/14 0449  NA 135 144 143  K 3.0* 3.1* 3.3*  CL 98 103 103  CO2 25 27 27   GLUCOSE 314* 230* 82  BUN 16 14 14   CREATININE 1.4* 1.16* 0.97  CALCIUM 10.0 9.6 9.1   Liver Function Tests:  Recent Labs Lab 02/08/14 1417  AST 21  ALT 16  ALKPHOS 54  BILITOT 0.3  PROT 7.3  ALBUMIN 3.9   No results found for this basename: LIPASE, AMYLASE,  in the last 168 hours No results found for this basename: AMMONIA,  in the last 168 hours CBC:  Recent Labs Lab 02/08/14 1417 02/10/14 1525  WBC 6.3 10.0  NEUTROABS 3.9 7.5  HGB 10.9* 10.3*  HCT 32.3* 30.9*  MCV 88.5 88.0  PLT 266.0 246   Cardiac Enzymes: No results found for this basename: CKTOTAL, CKMB, CKMBINDEX, TROPONINI,  in the last 168 hours BNP (last 3 results)  Recent Labs  05/22/13 1640 10/13/13 1305 10/16/13 0423  PROBNP 173.3 1431.0* 718.6*   CBG:  Recent Labs Lab 02/11/14 1559 02/11/14 2118 02/11/14 2129 02/12/14 0641 02/12/14 1107  GLUCAP 95 395* 371* 83 149*    Recent Results (from the past 240 hour(s))  URINE CULTURE     Status: None   Collection Time    02/10/14  5:47 PM      Result  Value Ref Range Status   Specimen Description URINE, CATHETERIZED   Final   Special Requests NONE   Final   Culture  Setup Time     Final   Value: 02/11/2014 16:24     Performed at [place]     Final   Value: >=100,000 COLONIES/ML     Performed at [facility]   Culture     Final   Value: ESCHERICHIA COLI     Performed at [facility]   Report Status PENDING   Incomplete     Studies: Dg Hip Complete Right  02/10/2014   CLINICAL DATA:  Fall with right hip and pelvic pain.  EXAM: RIGHT HIP - COMPLETE 2+ VIEW   COMPARISON:  12/11/2013 and 10/05/2013  FINDINGS: Exam demonstrates diffuse decreased bone mineralization. There are mild symmetric degenerative changes of the hips. There is a displaced slightly comminuted fracture of the right superior pubic ramus extending to the symphysis. There is also a minimally displaced acute right inferior pubic ramus fracture towards the ischial tuberosity. There is suggestion of an old left inferior pubic ramus fracture. There are degenerative changes of the spine.  IMPRESSION: Displaced mildly comminuted fracture of the right superior pubic ramus extending to the symphysis. Acute minimal displaced right inferior pubic ramus fracture towards the ischial tuberosity.  Old left inferior pubic ramus fracture.   Electronically Signed   By: Marin Olp M.D.   On: 02/10/2014 14:33   Dg Chest Port 1 View  02/10/2014   CLINICAL DATA:  Pneumonia  EXAM: PORTABLE CHEST - 1 VIEW  COMPARISON:  February 08, 2014  FINDINGS: The lungs are clear. Heart size and pulmonary vascularity are normal. No adenopathy. No bone lesions.  IMPRESSION: No edema or consolidation.   Electronically Signed   By: Lowella Grip M.D.   On: 02/10/2014 18:40    Scheduled Meds: . acetaminophen  500 mg Oral TID  . amLODipine  10 mg Oral q morning - 10a  . atorvastatin  10 mg Oral q morning - 10a  . buPROPion  100 mg Oral Daily  . cefTRIAXone (ROCEPHIN)  IV  1 g Intravenous Q24H  . citalopram  10 mg Oral Daily  . clonazePAM  0.25 mg Oral TID  . clopidogrel  75 mg Oral Q breakfast  . docusate sodium  100 mg Oral BID  . enoxaparin (LOVENOX) injection  40 mg Subcutaneous Q24H  . furosemide  20 mg Oral Daily  . insulin aspart  0-5 Units Subcutaneous QHS  . insulin aspart  0-9 Units Subcutaneous TID WC  . insulin glargine  20 Units Subcutaneous q morning - 10a  . levETIRAcetam  1,000 mg Oral QHS  . levETIRAcetam  500 mg Oral Daily  . Memantine HCl ER  1 capsule Oral q morning - 10a  . pantoprazole  40 mg Oral  Daily  . [START ON 02/13/2014] potassium chloride  20 mEq Oral Daily  . tamsulosin  0.4 mg Oral q morning - 10a   Continuous Infusions: . sodium chloride 1,000 mL (02/11/14 1625)      Time spent: 25 minutes    [name]  [facility] Pager [phone] If 7PM-7AM, please contact night-coverage at [email or web address], password [facility] 02/12/2014, 1:51 PM  LOS: 2 days

## 2014-02-13 LAB — URINE CULTURE: Colony Count: >=100000

## 2014-02-13 LAB — GLUCOSE, CAPILLARY
GLUCOSE-CAPILLARY: 59 mg/dL — AB (ref 70–99)
GLUCOSE-CAPILLARY: 213 mg/dL — AB (ref 70–99)
Glucose-Capillary: 75 mg/dL (ref 70–99)

## 2014-02-13 MED ORDER — GLUCERNA SHAKE PO LIQD: 237.0000 mL | Freq: Two times a day (BID) | ORAL | Status: DC

## 2014-02-13 MED ORDER — NAPROXEN SODIUM 275 MG PO TABS
275.0000 mg | ORAL_TABLET | Freq: Two times a day (BID) | ORAL | Status: DC
Start: 2014-02-13 — End: 2014-02-27

## 2014-02-13 MED ORDER — ACETAMINOPHEN 500 MG PO TABS: 500.0000 mg | ORAL_TABLET | Freq: Four times a day (QID) | ORAL | Status: DC

## 2014-02-13 MED ORDER — CEPHALEXIN 500 MG PO CAPS
500.0000 mg | ORAL_CAPSULE | Freq: Two times a day (BID) | ORAL | Status: DC
Start: 2014-02-13 — End: 2014-02-22

## 2014-02-13 NOTE — Progress Notes (Signed)
Patient discharged to home accompanied by family. Discharge instructions and rx given and explained. IV was removed and patient left unit in a stable condition with all personal belongings via wheelchair.

## 2014-02-13 NOTE — Progress Notes (Signed)
Morning cbg 59.  Patient was given 4 oz O.J. and a snack.  Patient asymptomatic. Will re-check cbg in 20 minutes.

## 2014-02-13 NOTE — Discharge Summary (Addendum)
Physician Discharge Summary  [name] WCH:[id number] DOB: 02/20/1931 DOA: 02/10/2014  PCP: Cathlean Cower, MD  Admit date: 02/10/2014 Discharge date: 02/13/2014  Time spent: 35  minutes  Recommendations for Outpatient Follow-up:  1. Discharge home with [facility]. Allow  weight bearing as tolerated.  Discharge Diagnoses:  Principal Problem:   Closed fracture of multiple pubic rami  Active Problems:   UTI (urinary tract infection)   HYPERTENSION   History of CVA (cerebrovascular accident)   Dementia   Abnormal involuntary movement   Hypokalemia Protein calorie malnutrition, severe   Discharge Condition: fair  Diet recommendation: cardiac with supplements  Filed Weights   02/13/14 1100  Weight: 55.3 kg (121 lb 14.6 oz)    History of present illness:  Please refer to admission H&P for details, but in brief, 80 y/o Female with history of moderate dementia, CVA, history of involuntary movements, diabetes mellitus, hypertension and anxiety, bedbound at baseline presented with a fall with closed pelvic fracture.  Hospital Course:  Closed fracture of multiple pubic rami/ Pelvic fracture  -Seen by ortho consult with Dr. Doran Durand and recommends non opertaive management  -pain contro and PT eval with weight bearing as tolerated. .PT recommend SNF but patient has used up all her medicare days at [facility]. Son willing to take her home with PT. sher has been able to more around with support and is safe to be discharged home with services. -will discharge her on schedule tylenol and naproxen bid. Allergic to codeine. Weight beraring as tolerated.  Follow up with Dr Doran Durand in 2 weeks. Follow up with PCP in 1 week.   UTI  Urine culture grew Escherichia coli sensitive to oral keflex. Resistant to quinolones.. On empiric rocephin and has tolerate it well. Documented as allergy to keflex. Pt and son do not know what allergy she has to keflex. Son also informs that she took amoxicillin in past without  any issues. Will discharge her on oral keflex to compete a 7 day course.Marland Kitchen   HYPERTENSION  -stable, continue amlodipine   History of CVA (cerebrovascular accident)  -continue plavix   Dementia  -continue namenda, antidepressants and home regimen of klonopin   Abnormal involuntary movement/tremors  -workup last admission unremarkable  -started on Keppra for this by neurology.   DM  -resume metformin  Chronic anemia  -stable   Hypokalemia  replenish kcl   Protein calorie malnutrition, severe Added supplements   Code Status: DO NOT RESUSCITATE  Family Communication: spoke with son over the phone and updated plan and outpt recommednations Disposition Plan: home with HHPT  Consultants:  Orthopedics ( Dr Doran Durand)   Procedures:  None   Antibiotics: IV rocephin Patient ill be discharged on oral keflex   Discharge Exam: Filed Vitals:   02/13/14 0635  BP: 163/58  Pulse: 74  Temp: 97.7 F (36.5 C)  Resp: 18    General: elderly female in no acute distress  HEENT: No pallor, facial tremors  Cardiovascular: Normal S1 and S2, no murmurs or gallop  Respiratory: Clear to auscultation bilaterally  Abdomen: Soft, nontender, nondistended, bowel sounds present  Musculoskeletal: Warm , No edema , Limited upper limb of the hips R>L CNS: AAO X2   Discharge Instructions You were cared for by a hospitalist during your hospital stay. If you have any questions about your discharge medications or the care you received while you were in the hospital after you are discharged, you can call the unit and asked to speak with the hospitalist on call  if the hospitalist that took care of you is not available. Once you are discharged, your primary care physician will handle any further medical issues. Please note that NO REFILLS for any discharge medications will be authorized once you are discharged, as it is imperative that you return to your primary care physician (or establish a  relationship with a primary care physician if you do not have one) for your aftercare needs so that they can reassess your need for medications and monitor your lab values.  Discharge Instructions   Weight bearing as tolerated    Complete by:  As directed   Laterality:  bilateral  Extremity:  Lower            Medication List         acetaminophen 500 MG tablet  Commonly known as:  TYLENOL  Take 1 tablet (500 mg total) by mouth every 6 (six) hours.     amLODipine 10 MG tablet  Commonly known as:  NORVASC  Take 1 tablet (10 mg total) by mouth every morning.     atorvastatin 10 MG tablet  Commonly known as:  LIPITOR  Take 1 tablet (10 mg total) by mouth every morning.     buPROPion 100 MG tablet  Commonly known as:  WELLBUTRIN  Take 1 tablet (100 mg total) by mouth daily.     cephALEXin 500 MG capsule  Commonly known as:  KEFLEX  Take 1 capsule (500 mg total) by mouth 2 (two) times daily. Until 02/17/2014     cetirizine 10 MG tablet  Commonly known as:  ZYRTEC  Take 10 mg by mouth every morning.     citalopram 10 MG tablet  Commonly known as:  CELEXA  Take 1 tablet (10 mg total) by mouth daily.     clonazePAM 0.25 MG disintegrating tablet  Commonly known as:  KLONOPIN  Take 1 tablet (0.25 mg total) by mouth 3 (three) times daily.     clopidogrel 75 MG tablet  Commonly known as:  PLAVIX  Take 1 tablet (75 mg total) by mouth daily with breakfast.     DSS 100 MG Caps  Take 100 mg by mouth 2 (two) times daily.     feeding supplement (GLUCERNA SHAKE) Liqd  Take 237 mLs by mouth 2 (two) times daily between meals.     ferrous sulfate 325 (65 FE) MG tablet  Take 1 tablet (325 mg total) by mouth 3 (three) times daily after meals.     furosemide 20 MG tablet  Commonly known as:  LASIX  Take 1 tablet (20 mg total) by mouth daily.     insulin glargine 100 UNIT/ML injection  Commonly known as:  LANTUS  Inject 20 Units into the skin every morning.     levETIRAcetam  500 MG tablet  Commonly known as:  KEPPRA  Take 500-1,000 mg by mouth 2 (two) times daily. 1 am 2pm     Memantine HCl ER 28 MG Cp24  Commonly known as:  NAMENDA XR  Take 28 mg by mouth every morning.     metFORMIN 500 MG tablet  Commonly known as:  GLUCOPHAGE  Take 1 tablet (500 mg total) by mouth 2 (two) times daily with a meal.     naproxen sodium 275 MG tablet  Commonly known as:  ANAPROX  Take 1 tablet (275 mg total) by mouth 2 (two) times daily with a meal.     omeprazole 20 MG capsule  Commonly known as:  PRILOSEC  Take 1 capsule (20 mg total) by mouth daily.     potassium chloride 10 MEQ tablet  Commonly known as:  KLOR-CON 10  Take 1 tablet (10 mEq total) by mouth daily.     tamsulosin 0.4 MG Caps capsule  Commonly known as:  FLOMAX  Take 1 capsule (0.4 mg total) by mouth every morning.       Allergies  Allergen Reactions  . Other Nausea And Vomiting    ANESTHESIA AND STRONG PAIN MEDS-VERY SENSITIVE  . Codeine Nausea And Vomiting and Rash  . Hydrocodone-Acetaminophen Nausea And Vomiting and Rash  . Keflex [Cephalexin] Other (See Comments)    Pt and her son do not know what the reaction is to this medication Tolerated Rocephin 02/13/14, TDD  . Oxycodone-Acetaminophen Nausea And Vomiting and Rash       Follow-up Information   Follow up with Wylene Simmer, MD In 1 month.   Specialty:  Orthopedic Surgery   Contact information:   [address] [place] [address] [phone]       Follow up with Cathlean Cower, MD. Schedule an appointment as soon as possible for a visit in 1 week.   Specialties:  Internal Medicine, Radiology   Contact information:   [place] [address] [phone]        The results of significant diagnostics from this hospitalization (including imaging, microbiology, ancillary and laboratory) are listed below for reference.    Significant Diagnostic Studies: Dg Chest 1 View  02/08/2014    CLINICAL DATA:  Abnormal chest sounds, diabetes  EXAM: CHEST - 1 VIEW  COMPARISON:  CT chest of 10/13/2013 and chest x-ray of the same date  FINDINGS: No focal infiltrate or effusion is seen. The heart is mildly enlarged. Mediastinal and hilar contours appear normal. No acute bony abnormality is seen.  IMPRESSION: Stable mild cardiomegaly.  No active lung disease.   Electronically Signed   By: Ivar Drape M.D.   On: 02/08/2014 15:38   Dg Hip Complete Right  02/10/2014   CLINICAL DATA:  Fall with right hip and pelvic pain.  EXAM: RIGHT HIP - COMPLETE 2+ VIEW  COMPARISON:  12/11/2013 and 10/05/2013  FINDINGS: Exam demonstrates diffuse decreased bone mineralization. There are mild symmetric degenerative changes of the hips. There is a displaced slightly comminuted fracture of the right superior pubic ramus extending to the symphysis. There is also a minimally displaced acute right inferior pubic ramus fracture towards the ischial tuberosity. There is suggestion of an old left inferior pubic ramus fracture. There are degenerative changes of the spine.  IMPRESSION: Displaced mildly comminuted fracture of the right superior pubic ramus extending to the symphysis. Acute minimal displaced right inferior pubic ramus fracture towards the ischial tuberosity.  Old left inferior pubic ramus fracture.   Electronically Signed   By: Marin Olp M.D.   On: 02/10/2014 14:33   Ct Head Wo Contrast  02/10/2014   CLINICAL DATA:  Fall, rule out bleed.  Dementia.  EXAM: CT HEAD WITHOUT CONTRAST  TECHNIQUE: Contiguous axial images were obtained from the base of the skull through the vertex without intravenous contrast.  COMPARISON:  12/10/2013 and 10/05/2013  FINDINGS: The ventricles and cisterns are within normal. There is mild age related atrophic change which stable. Chronic ischemic microvascular disease is present. Small left frontal infarct. Possible old small right thalamus lacunar infarct. There is no mass, mass effect, shift  of midline structures or acute hemorrhage.  There is no definite evidence of acute infarction. Remainder the exam is unchanged. Minimal chronic sinus inflammatory disease.  IMPRESSION: No acute intracranial findings.  Age related atrophy and chronic ischemic microvascular disease. Small old left frontal infarct.  Mild chronic sinus inflammatory disease.   Electronically Signed   By: Marin Olp M.D.   On: 02/10/2014 12:17   Dg Chest Port 1 View  02/10/2014   CLINICAL DATA:  Pneumonia  EXAM: PORTABLE CHEST - 1 VIEW  COMPARISON:  February 08, 2014  FINDINGS: The lungs are clear. Heart size and pulmonary vascularity are normal. No adenopathy. No bone lesions.  IMPRESSION: No edema or consolidation.   Electronically Signed   By: Lowella Grip M.D.   On: 02/10/2014 18:40    Microbiology: Recent Results (from the past 240 hour(s))  URINE CULTURE     Status: None   Collection Time    02/10/14  5:47 PM      Result Value Ref Range Status   Specimen Description URINE, CATHETERIZED   Final   Special Requests NONE   Final   Culture  Setup Time     Final   Value: 02/11/2014 16:24     Performed at [facility]     Final   Value: >=100,000 COLONIES/ML     Performed at [facility]   Culture     Final   Value: ESCHERICHIA COLI     Performed at [facility]   Report Status 02/13/2014 FINAL   Final   Organism ID, Bacteria ESCHERICHIA COLI   Final     Labs: Basic Metabolic Panel:  Recent Labs Lab 02/08/14 1417 02/10/14 1525 02/12/14 0449  NA 135 144 143  K 3.0* 3.1* 3.3*  CL 98 103 103  CO2 25 27 27   GLUCOSE 314* 230* 82  BUN 16 14 14   CREATININE 1.4* 1.16* 0.97  CALCIUM 10.0 9.6 9.1   Liver Function Tests:  Recent Labs Lab 02/08/14 1417  AST 21  ALT 16  ALKPHOS 54  BILITOT 0.3  PROT 7.3  ALBUMIN 3.9   No results found for this basename: LIPASE, AMYLASE,  in the last 168 hours No results found for this basename: AMMONIA,  in the last 168  hours CBC:  Recent Labs Lab 02/08/14 1417 02/10/14 1525  WBC 6.3 10.0  NEUTROABS 3.9 7.5  HGB 10.9* 10.3*  HCT 32.3* 30.9*  MCV 88.5 88.0  PLT 266.0 246   Cardiac Enzymes: No results found for this basename: CKTOTAL, CKMB, CKMBINDEX, TROPONINI,  in the last 168 hours BNP: BNP (last 3 results)  Recent Labs  05/22/13 1640 10/13/13 1305 10/16/13 0423  PROBNP 173.3 1431.0* 718.6*   CBG:  Recent Labs Lab 02/12/14 1107 02/12/14 1613 02/12/14 2142 02/13/14 0638 02/13/14 0700  GLUCAP 149* 119* 80 59* 75       Signed:  Jamaris Biernat  [facility] 02/13/2014, 11:05 AM

## 2014-02-13 NOTE — Care Management Note (Signed)
CARE MANAGEMENT NOTE 02/13/2014  Patient:  [name], [name]   Account Number:  [id number]  Date Initiated:  02/13/2014  Documentation initiated by:  Ricki Miller  Subjective/Objective Assessment:   80 yr old female admitted after fall with pelvic fracture and UTI.     Action/Plan:   Case manager spoke with Margaretha Glassing concerning home health needs and agency of choice. patient is active with [facility], orders faxed to [place]   Anticipated DC Date:  02/13/2014   Anticipated DC Plan:  [place]  CM consult      [facility] Choice  Resumption Of Svcs/PTA Provider   Choice offered to / List presented to:  C-4 Adult Children   DME arranged  NA        HH arranged  NA      Status of service:  Completed, signed off Medicare Important Message given?  NA - LOS <3 / Initial given by admissions (If response is "NO", the following Medicare IM given date fields will be blank) Date Medicare IM given:   Date Additional Medicare IM given:    Discharge Disposition:  [place]

## 2014-02-13 NOTE — Progress Notes (Signed)
Patient's cbg came up to 61.

## 2014-02-15 DIAGNOSIS — I5031 Acute diastolic (congestive) heart failure: Secondary | ICD-10-CM

## 2014-02-15 DIAGNOSIS — F0390 Unspecified dementia without behavioral disturbance: Secondary | ICD-10-CM

## 2014-02-15 DIAGNOSIS — E119 Type 2 diabetes mellitus without complications: Secondary | ICD-10-CM

## 2014-02-15 DIAGNOSIS — R279 Unspecified lack of coordination: Secondary | ICD-10-CM

## 2014-02-16 ENCOUNTER — Telehealth: Payer: Self-pay | Admitting: Internal Medicine

## 2014-02-16 NOTE — Telephone Encounter (Signed)
[name] with [name] is calling to let Dr. Jenny Reichmann know that he completed hiss assessment today to resume PT.  He wants to know if Dr. Jenny Reichmann will authorize frequency for PT for 2x a week for 2 weeks, 1x a week for 3 weeks and 2x a week for 1 week. Please call to advise if okay.

## 2014-02-16 NOTE — Telephone Encounter (Signed)
HHRN informed of verbal ok. 

## 2014-02-16 NOTE — Telephone Encounter (Signed)
Ok for verbal 

## 2014-02-22 ENCOUNTER — Encounter: Payer: Self-pay | Admitting: Internal Medicine

## 2014-02-22 ENCOUNTER — Ambulatory Visit (INDEPENDENT_AMBULATORY_CARE_PROVIDER_SITE_OTHER): Payer: Medicare Other | Admitting: Internal Medicine

## 2014-02-22 VITALS — BP 120/72 | HR 90 | Temp 98.3°F | Wt 121.0 lb

## 2014-02-22 DIAGNOSIS — IMO0002 Reserved for concepts with insufficient information to code with codable children: Secondary | ICD-10-CM

## 2014-02-22 DIAGNOSIS — S329XXS Fracture of unspecified parts of lumbosacral spine and pelvis, sequela: Secondary | ICD-10-CM

## 2014-02-22 DIAGNOSIS — F0390 Unspecified dementia without behavioral disturbance: Secondary | ICD-10-CM

## 2014-02-22 DIAGNOSIS — N390 Urinary tract infection, site not specified: Secondary | ICD-10-CM

## 2014-02-22 MED ORDER — RISPERIDONE 1 MG PO TABS: 1.0000 mg | ORAL_TABLET | Freq: Every evening | ORAL | Status: DC | PRN

## 2014-02-22 MED ORDER — FENTANYL 25 MCG/HR TD PT72: 25.0000 ug | MEDICATED_PATCH | TRANSDERMAL | Status: DC

## 2014-02-22 NOTE — Assessment & Plan Note (Addendum)
stable overall by history and exam, recent data reviewed with pt, and pt to continue medical treatment as before except to add risperdal 1 mg qhs for symtpoms,  to f/u any worsening symptoms or concerns Lab Results  Component Value Date   WBC 10.0 02/10/2014   HGB 10.3* 02/10/2014   HCT 30.9* 02/10/2014   PLT 246 02/10/2014   GLUCOSE 82 02/12/2014   CHOL 155 02/08/2014   TRIG 206.0* 02/08/2014   HDL 65.40 02/08/2014   LDLDIRECT 158.0 04/06/2013   LDLCALC 48 02/08/2014   ALT 16 02/08/2014   AST 21 02/08/2014   NA 143 02/12/2014   K 3.3* 02/12/2014   CL 103 02/12/2014   CREATININE 0.97 02/12/2014   BUN 14 02/12/2014   CO2 27 02/12/2014   TSH 2.13 02/08/2014   INR 1.05 02/10/2014   HGBA1C 7.7* 02/08/2014   MICROALBUR 5.5* 10/04/2013

## 2014-02-22 NOTE — Assessment & Plan Note (Signed)
Resolved, asympt, ok to follow..  to f/u any worsening symptoms or concerns

## 2014-02-22 NOTE — Assessment & Plan Note (Signed)
Ok for low dose fentanyl patch to help with pain control, cont pt/ot, f/u Dr Dutch Gray as planned, has [facility] for 4 wks as well, and now hospital bed with rails to minimize further risk of fall

## 2014-02-22 NOTE — Patient Instructions (Signed)
Please take all new medication as prescribed - the pain patch to start with for about 2 days, then add the risperdal at bedtime   Please continue all other medications as before, and refills have been done if requested.  Please have the pharmacy call with any other refills you may need.  Please continue your efforts at being more active, low cholesterol diet, and weight control.  Please keep your appointments with your specialists as you may have planned  Cont PT/OT at home with [facility] as you have

## 2014-02-22 NOTE — Progress Notes (Signed)
Subjective:    Patient ID: [name], female    DOB: April 27, 1931, 80 y.o.   MRN: [id number]  HPI here to f/u, 9 days post hosp d/c, has night time hallucinations, mild agitation, sees dead people, up all night, family needs relief.  No further falls but Pain still > 5/10, cannot really participate with pt/ot, afraid to even move in wheelchair.  Denies urinary symptoms such as dysuria, frequency, urgency, flank pain, hematuria or n/v, fever, chills.  Dementia overall o/w stable symptomatically, and not assoc with behavioral changes such as paranoia.   Past Medical History  Diagnosis Date  . COLONIC POLYPS 06/15/2005  . DIABETES MELLITUS, TYPE II 04/22/2007  . HYPERCHOLESTEROLEMIA   . HYPERLIPIDEMIA   . OBESITY   . ANEMIA-IRON DEFICIENCY   . DEPRESSION   . HYPERTENSION   . ALLERGIC RHINITIS   . ESOPHAGEAL STRICTURE   . GERD   . HIATAL HERNIA   . DIVERTICULOSIS, COLON   . BACK PAIN   . OSTEOPOROSIS   . DIZZINESS   . WEIGHT LOSS   . DYSPNEA   . Other dysphagia   . COLONIC POLYPS, HX OF 04/03/2002 & 06/15/2005    TUBULAR ADENOMA  . History of CVA (cerebrovascular accident) 07/29/2012    Noted old, to right thalamus and pons by Head CT Jul 27, 2012  . Dementia 09/17/2012  . Type II or unspecified type diabetes mellitus without mention of complication, uncontrolled 07/16/2012  . Movement disorder    Past Surgical History  Procedure Laterality Date  . Appendectomy    . Abdominal hysterectomy    . Tonsillectomy and adenoidectomy    . Left ankle surgury    . S/p bladder pubovaginal sling    . S/p cystocele/rectocele repair    . Laparoscopic takedown of incarcerated stomach within the chest/nissen  2009  . Esophageal manometry  12/21/2011    Procedure: ESOPHAGEAL MANOMETRY (EM);  Surgeon: Sable Feil, MD;  Location: [facility];  Service: Endoscopy;  Laterality: N/A;    reports that she has never smoked. She has never used smokeless tobacco. She reports that she does not  drink alcohol or use illicit drugs. family history includes Colon cancer in her father; Diabetes in her father and mother; Heart attack in her mother; Heart disease (age of onset: 35) in her mother. Allergies  Allergen Reactions  . Other Nausea And Vomiting    ANESTHESIA AND STRONG PAIN MEDS-VERY SENSITIVE  . Codeine Nausea And Vomiting and Rash  . Hydrocodone-Acetaminophen Nausea And Vomiting and Rash  . Keflex [Cephalexin] Rash and Other (See Comments)    Pt and her son do not know what the reaction is to this medication Tolerated Rocephin 02/13/14, TDD  . Oxycodone-Acetaminophen Nausea And Vomiting and Rash   Current Outpatient Prescriptions on File Prior to Visit  Medication Sig Dispense Refill  . acetaminophen (TYLENOL) 500 MG tablet Take 1 tablet (500 mg total) by mouth every 6 (six) hours.  30 tablet  0  . amLODipine (NORVASC) 10 MG tablet Take 1 tablet (10 mg total) by mouth every morning.  30 tablet  11  . atorvastatin (LIPITOR) 10 MG tablet Take 1 tablet (10 mg total) by mouth every morning.  30 tablet  11  . buPROPion (WELLBUTRIN) 100 MG tablet Take 1 tablet (100 mg total) by mouth daily.  30 tablet  11  . cetirizine (ZYRTEC) 10 MG tablet Take 10 mg by mouth every morning.       Marland Kitchen  citalopram (CELEXA) 10 MG tablet Take 1 tablet (10 mg total) by mouth daily.  90 tablet  3  . clonazePAM (KLONOPIN) 0.25 MG disintegrating tablet Take 1 tablet (0.25 mg total) by mouth 3 (three) times daily.  60 tablet  3  . clopidogrel (PLAVIX) 75 MG tablet Take 1 tablet (75 mg total) by mouth daily with breakfast.  30 tablet  11  . docusate sodium 100 MG CAPS Take 100 mg by mouth 2 (two) times daily.  10 capsule  0  . feeding supplement, GLUCERNA SHAKE, (GLUCERNA SHAKE) LIQD Take 237 mLs by mouth 2 (two) times daily between meals.  60 Can  0  . ferrous sulfate 325 (65 FE) MG tablet Take 1 tablet (325 mg total) by mouth 3 (three) times daily after meals.  90 tablet  0  . furosemide (LASIX) 20 MG tablet  Take 1 tablet (20 mg total) by mouth daily.  30 tablet    . insulin glargine (LANTUS) 100 UNIT/ML injection Inject 20 Units into the skin every morning.       . levETIRAcetam (KEPPRA) 500 MG tablet Take 500-1,000 mg by mouth 2 (two) times daily. 1 am 2pm      . Memantine HCl ER (NAMENDA XR) 28 MG CP24 Take 28 mg by mouth every morning.  30 capsule  11  . metFORMIN (GLUCOPHAGE) 500 MG tablet Take 1 tablet (500 mg total) by mouth 2 (two) times daily with a meal.  60 tablet  11  . naproxen sodium (ANAPROX) 275 MG tablet Take 1 tablet (275 mg total) by mouth 2 (two) times daily with a meal.  15 tablet  0  . omeprazole (PRILOSEC) 20 MG capsule Take 1 capsule (20 mg total) by mouth daily.  30 capsule  11  . potassium chloride (KLOR-CON 10) 10 MEQ tablet Take 1 tablet (10 mEq total) by mouth daily.  90 tablet  3  . tamsulosin (FLOMAX) 0.4 MG CAPS capsule Take 1 capsule (0.4 mg total) by mouth every morning.  30 capsule  11   No current facility-administered medications on file prior to visit.   Review of Systems  Constitutional: Negative for unusual diaphoresis or other sweats  HENT: Negative for ringing in ear Eyes: Negative for double vision or worsening visual disturbance.  Respiratory: Negative for choking and stridor.   Gastrointestinal: Negative for vomiting or other signifcant bowel change Genitourinary: Negative for hematuria or decreased urine volume.  Musculoskeletal: Negative for other MSK pain or swelling Skin: Negative for color change and worsening wound.  Neurological: Negative for tremors and numbness other than noted  Psychiatric/Behavioral: Negative for decreased concentration or agitation other than above       Objective:   Physical Exam BP 120/72  Pulse 90  Temp(Src) 98.3 F (36.8 C) (Oral)  Wt 121 lb (54.885 kg)  SpO2 94% VS noted, in wheelchair Constitutional: Pt appears thin for ht HENT: Head: NCAT.  Right Ear: External ear normal.  Left Ear: External ear normal.    Eyes: . Pupils are equal, round, and reactive to light. Conjunctivae and EOM are normal Neck: Normal range of motion. Neck supple.  Cardiovascular: Normal rate and regular rhythm.   Pulmonary/Chest: Effort normal and breath sounds normal.  Abd:  Soft, NT, ND, + BS Neurological: Pt is alert. At baseline confused it seems , motor grossly intact Skin: Skin is warm. No rash, no LE edema Psychiatric: Pt behavior is non agitated, cooperative, pleasant    Assessment & Plan:

## 2014-02-22 NOTE — Progress Notes (Signed)
Pre visit review using our clinic review tool, if applicable. No additional management support is needed unless otherwise documented below in the visit note. 

## 2014-02-27 ENCOUNTER — Encounter (HOSPITAL_COMMUNITY): Payer: Self-pay | Admitting: Emergency Medicine

## 2014-02-27 ENCOUNTER — Inpatient Hospital Stay (HOSPITAL_COMMUNITY)
Admission: EM | Admit: 2014-02-27 | Discharge: 2014-03-02 | DRG: 871 | Disposition: A | Discharge status: Hospice | Payer: Medicare Other | Attending: Internal Medicine | Admitting: Internal Medicine

## 2014-02-27 ENCOUNTER — Emergency Department (HOSPITAL_COMMUNITY): Payer: Medicare Other

## 2014-02-27 DIAGNOSIS — E119 Type 2 diabetes mellitus without complications: Secondary | ICD-10-CM | POA: Diagnosis present

## 2014-02-27 DIAGNOSIS — Z860100 Personal history of colon polyps, unspecified: Secondary | ICD-10-CM

## 2014-02-27 DIAGNOSIS — Z800 Family history of malignant neoplasm of digestive organs: Secondary | ICD-10-CM

## 2014-02-27 DIAGNOSIS — D509 Iron deficiency anemia, unspecified: Secondary | ICD-10-CM

## 2014-02-27 DIAGNOSIS — J9601 Acute respiratory failure with hypoxia: Secondary | ICD-10-CM

## 2014-02-27 DIAGNOSIS — I10 Essential (primary) hypertension: Secondary | ICD-10-CM | POA: Diagnosis present

## 2014-02-27 DIAGNOSIS — F3289 Other specified depressive episodes: Secondary | ICD-10-CM

## 2014-02-27 DIAGNOSIS — G40209 Localization-related (focal) (partial) symptomatic epilepsy and epileptic syndromes with complex partial seizures, not intractable, without status epilepticus: Secondary | ICD-10-CM | POA: Diagnosis present

## 2014-02-27 DIAGNOSIS — R4182 Altered mental status, unspecified: Secondary | ICD-10-CM | POA: Diagnosis present

## 2014-02-27 DIAGNOSIS — Z66 Do not resuscitate: Secondary | ICD-10-CM | POA: Diagnosis present

## 2014-02-27 DIAGNOSIS — M549 Dorsalgia, unspecified: Secondary | ICD-10-CM | POA: Diagnosis present

## 2014-02-27 DIAGNOSIS — IMO0001 Reserved for inherently not codable concepts without codable children: Secondary | ICD-10-CM

## 2014-02-27 DIAGNOSIS — N183 Chronic kidney disease, stage 3 unspecified: Secondary | ICD-10-CM

## 2014-02-27 DIAGNOSIS — I129 Hypertensive chronic kidney disease with stage 1 through stage 4 chronic kidney disease, or unspecified chronic kidney disease: Secondary | ICD-10-CM | POA: Diagnosis present

## 2014-02-27 DIAGNOSIS — Z79899 Other long term (current) drug therapy: Secondary | ICD-10-CM

## 2014-02-27 DIAGNOSIS — J189 Pneumonia, unspecified organism: Secondary | ICD-10-CM

## 2014-02-27 DIAGNOSIS — F329 Major depressive disorder, single episode, unspecified: Secondary | ICD-10-CM

## 2014-02-27 DIAGNOSIS — R195 Other fecal abnormalities: Secondary | ICD-10-CM

## 2014-02-27 DIAGNOSIS — K2100 Gastro-esophageal reflux disease with esophagitis, without bleeding: Secondary | ICD-10-CM

## 2014-02-27 DIAGNOSIS — Z8249 Family history of ischemic heart disease and other diseases of the circulatory system: Secondary | ICD-10-CM

## 2014-02-27 DIAGNOSIS — J9600 Acute respiratory failure, unspecified whether with hypoxia or hypercapnia: Secondary | ICD-10-CM

## 2014-02-27 DIAGNOSIS — M810 Age-related osteoporosis without current pathological fracture: Secondary | ICD-10-CM | POA: Diagnosis present

## 2014-02-27 DIAGNOSIS — K922 Gastrointestinal hemorrhage, unspecified: Secondary | ICD-10-CM

## 2014-02-27 DIAGNOSIS — A419 Sepsis, unspecified organism: Principal | ICD-10-CM | POA: Diagnosis present

## 2014-02-27 DIAGNOSIS — Z515 Encounter for palliative care: Secondary | ICD-10-CM

## 2014-02-27 DIAGNOSIS — E785 Hyperlipidemia, unspecified: Secondary | ICD-10-CM | POA: Diagnosis present

## 2014-02-27 DIAGNOSIS — E43 Unspecified severe protein-calorie malnutrition: Secondary | ICD-10-CM | POA: Diagnosis present

## 2014-02-27 DIAGNOSIS — J690 Pneumonitis due to inhalation of food and vomit: Secondary | ICD-10-CM | POA: Diagnosis present

## 2014-02-27 DIAGNOSIS — D62 Acute posthemorrhagic anemia: Secondary | ICD-10-CM | POA: Diagnosis present

## 2014-02-27 DIAGNOSIS — K219 Gastro-esophageal reflux disease without esophagitis: Secondary | ICD-10-CM | POA: Diagnosis present

## 2014-02-27 DIAGNOSIS — F0390 Unspecified dementia without behavioral disturbance: Secondary | ICD-10-CM

## 2014-02-27 DIAGNOSIS — Z833 Family history of diabetes mellitus: Secondary | ICD-10-CM

## 2014-02-27 DIAGNOSIS — K920 Hematemesis: Secondary | ICD-10-CM

## 2014-02-27 DIAGNOSIS — R569 Unspecified convulsions: Secondary | ICD-10-CM

## 2014-02-27 LAB — CBC WITH DIFFERENTIAL/PLATELET
BASOS ABS: 0 10*3/uL (ref 0.0–0.1)
BASOS PCT: 0 % (ref 0–1)
EOS ABS: 0 10*3/uL (ref 0.0–0.7)
EOS PCT: 0 % (ref 0–5)
HCT: 30.9 % — ABNORMAL LOW (ref 36.0–46.0)
Hemoglobin: 10 g/dL — ABNORMAL LOW (ref 12.0–15.0)
LYMPHS ABS: 1.1 10*3/uL (ref 0.7–4.0)
Lymphocytes Relative: 8 % — ABNORMAL LOW (ref 12–46)
MCH: 29.9 pg (ref 26.0–34.0)
MCHC: 32.4 g/dL (ref 30.0–36.0)
MCV: 92.2 fL (ref 78.0–100.0)
Monocytes Absolute: 0.7 10*3/uL (ref 0.1–1.0)
Monocytes Relative: 5 % (ref 3–12)
NEUTROS PCT: 87 % — AB (ref 43–77)
Neutro Abs: 11.6 10*3/uL — ABNORMAL HIGH (ref 1.7–7.7)
PLATELETS: 360 10*3/uL (ref 150–400)
RBC: 3.35 MIL/uL — ABNORMAL LOW (ref 3.87–5.11)
RDW: 13.8 % (ref 11.5–15.5)
WBC: 13.4 10*3/uL — ABNORMAL HIGH (ref 4.0–10.5)

## 2014-02-27 LAB — URINALYSIS, ROUTINE W REFLEX MICROSCOPIC
BILIRUBIN URINE: NEGATIVE
Glucose, UA: 100 mg/dL — AB
HGB URINE DIPSTICK: NEGATIVE
Ketones, ur: NEGATIVE mg/dL
Nitrite: NEGATIVE
PH: 5 (ref 5.0–8.0)
Protein, ur: NEGATIVE mg/dL
SPECIFIC GRAVITY, URINE: 1.014 (ref 1.005–1.030)
Urobilinogen, UA: 0.2 mg/dL (ref 0.0–1.0)

## 2014-02-27 LAB — TROPONIN I: Troponin I: <0.3 ng/mL (ref <0.30)

## 2014-02-27 LAB — COMPREHENSIVE METABOLIC PANEL
ALT: 12 U/L (ref 0–35)
ALT: 12 U/L (ref 0–35)
AST: 14 U/L (ref 0–37)
AST: 14 U/L (ref 0–37)
Albumin: 3.2 g/dL — ABNORMAL LOW (ref 3.5–5.2)
Albumin: 3.2 g/dL — ABNORMAL LOW (ref 3.5–5.2)
Alkaline Phosphatase: 126 U/L — ABNORMAL HIGH (ref 39–117)
Alkaline Phosphatase: 123 U/L — ABNORMAL HIGH (ref 39–117)
BILIRUBIN TOTAL: 0.3 mg/dL (ref 0.3–1.2)
BUN: 30 mg/dL — ABNORMAL HIGH (ref 6–23)
BUN: 30 mg/dL — AB (ref 6–23)
CO2: 21 mEq/L (ref 19–32)
CO2: 23 mEq/L (ref 19–32)
Calcium: 9.5 mg/dL (ref 8.4–10.5)
Calcium: 9.3 mg/dL (ref 8.4–10.5)
Chloride: 102 mEq/L (ref 96–112)
Chloride: 101 mEq/L (ref 96–112)
Creatinine, Ser: 1.52 mg/dL — ABNORMAL HIGH (ref 0.50–1.10)
Creatinine, Ser: 1.52 mg/dL — ABNORMAL HIGH (ref 0.50–1.10)
GFR calc Af Amer: 36 mL/min — ABNORMAL LOW (ref >90)
GFR calc non Af Amer: 31 mL/min — ABNORMAL LOW (ref >90)
GFR calc non Af Amer: 31 mL/min — ABNORMAL LOW (ref >90)
GFR, EST AFRICAN AMERICAN: 36 mL/min — AB (ref >90)
GLUCOSE: 374 mg/dL — AB (ref 70–99)
Glucose, Bld: 289 mg/dL — ABNORMAL HIGH (ref 70–99)
Potassium: 4.4 mEq/L (ref 3.7–5.3)
Potassium: 4.5 mEq/L (ref 3.7–5.3)
SODIUM: 143 meq/L (ref 137–147)
Sodium: 142 mEq/L (ref 137–147)
TOTAL PROTEIN: 7 g/dL (ref 6.0–8.3)
TOTAL PROTEIN: 7.1 g/dL (ref 6.0–8.3)
Total Bilirubin: 0.2 mg/dL — ABNORMAL LOW (ref 0.3–1.2)

## 2014-02-27 LAB — GLUCOSE, CAPILLARY
GLUCOSE-CAPILLARY: 318 mg/dL — AB (ref 70–99)
Glucose-Capillary: 277 mg/dL — ABNORMAL HIGH (ref 70–99)
Glucose-Capillary: 372 mg/dL — ABNORMAL HIGH (ref 70–99)

## 2014-02-27 LAB — URINE MICROSCOPIC-ADD ON

## 2014-02-27 LAB — I-STAT CHEM 8, ED
BUN: 31 mg/dL — ABNORMAL HIGH (ref 6–23)
CREATININE: 1.6 mg/dL — AB (ref 0.50–1.10)
Calcium, Ion: 1.25 mmol/L (ref 1.13–1.30)
Chloride: 105 mEq/L (ref 96–112)
GLUCOSE: 302 mg/dL — AB (ref 70–99)
HEMATOCRIT: 31 % — AB (ref 36.0–46.0)
Hemoglobin: 10.5 g/dL — ABNORMAL LOW (ref 12.0–15.0)
POTASSIUM: 4.2 meq/L (ref 3.7–5.3)
Sodium: 144 mEq/L (ref 137–147)
TCO2: 23 mmol/L (ref 0–100)

## 2014-02-27 LAB — CBC
HCT: 30.7 % — ABNORMAL LOW (ref 36.0–46.0)
Hemoglobin: 10 g/dL — ABNORMAL LOW (ref 12.0–15.0)
MCH: 29.9 pg (ref 26.0–34.0)
MCHC: 32.6 g/dL (ref 30.0–36.0)
MCV: 91.9 fL (ref 78.0–100.0)
PLATELETS: 364 10*3/uL (ref 150–400)
RBC: 3.34 MIL/uL — AB (ref 3.87–5.11)
RDW: 13.6 % (ref 11.5–15.5)
WBC: 12.2 10*3/uL — ABNORMAL HIGH (ref 4.0–10.5)

## 2014-02-27 LAB — PHOSPHORUS: Phosphorus: 3.9 mg/dL (ref 2.3–4.6)

## 2014-02-27 LAB — I-STAT CG4 LACTIC ACID, ED: Lactic Acid, Venous: 1.1 mmol/L (ref 0.5–2.2)

## 2014-02-27 LAB — PROTIME-INR
INR: 1.19 (ref 0.00–1.49)
PROTHROMBIN TIME: 14.8 s (ref 11.6–15.2)

## 2014-02-27 LAB — TSH: TSH: 1.03 u[IU]/mL (ref 0.350–4.500)

## 2014-02-27 LAB — MAGNESIUM: MAGNESIUM: 2.1 mg/dL (ref 1.5–2.5)

## 2014-02-27 LAB — APTT: aPTT: 28 seconds (ref 24–37)

## 2014-02-27 MED ORDER — CLONAZEPAM 0.125 MG PO TBDP: 0.2500 mg | ORAL_TABLET | Freq: Two times a day (BID) | ORAL | Status: DC | PRN

## 2014-02-27 MED ORDER — SODIUM CHLORIDE 0.9 % IJ SOLN
3.0000 mL | Freq: Two times a day (BID) | INTRAMUSCULAR | Status: DC
  Administered 2014-02-27: 3 mL via INTRAVENOUS

## 2014-02-27 MED ORDER — IPRATROPIUM BROMIDE 0.02 % IN SOLN: 0.5000 mg | RESPIRATORY_TRACT | Status: DC | PRN

## 2014-02-27 MED ORDER — CITALOPRAM HYDROBROMIDE 10 MG PO TABS
10.0000 mg | ORAL_TABLET | Freq: Every morning | ORAL | Status: DC
  Administered 2014-02-27 – 2014-03-01 (×3): 10 mg via ORAL
  Filled 2014-02-27 (×3): qty 1

## 2014-02-27 MED ORDER — ONDANSETRON HCL 4 MG/2ML IJ SOLN
4.0000 mg | Freq: Four times a day (QID) | INTRAMUSCULAR | Status: DC | PRN
  Administered 2014-03-01: 4 mg via INTRAVENOUS
  Filled 2014-02-27: qty 2

## 2014-02-27 MED ORDER — INSULIN ASPART 100 UNIT/ML ~~LOC~~ SOLN
0.0000 [IU] | SUBCUTANEOUS | Status: DC
  Administered 2014-02-27: 7 [IU] via SUBCUTANEOUS
  Administered 2014-02-28: 5 [IU] via SUBCUTANEOUS
  Administered 2014-02-28: 3 [IU] via SUBCUTANEOUS

## 2014-02-27 MED ORDER — VANCOMYCIN HCL 500 MG IV SOLR
500.0000 mg | INTRAVENOUS | Status: DC
  Administered 2014-02-28 – 2014-03-02 (×3): 500 mg via INTRAVENOUS
  Filled 2014-02-27 (×4): qty 500

## 2014-02-27 MED ORDER — FERROUS SULFATE 325 (65 FE) MG PO TABS
325.0000 mg | ORAL_TABLET | Freq: Three times a day (TID) | ORAL | Status: DC
  Administered 2014-02-27 – 2014-03-01 (×4): 325 mg via ORAL
  Filled 2014-02-27 (×8): qty 1

## 2014-02-27 MED ORDER — PANTOPRAZOLE SODIUM 40 MG PO TBEC
40.0000 mg | DELAYED_RELEASE_TABLET | Freq: Every day | ORAL | Status: DC
  Administered 2014-02-28: 40 mg via ORAL
  Filled 2014-02-27 (×2): qty 1

## 2014-02-27 MED ORDER — SODIUM CHLORIDE 0.9 % IV SOLN: INTRAVENOUS | Status: DC

## 2014-02-27 MED ORDER — FUROSEMIDE 20 MG PO TABS
20.0000 mg | ORAL_TABLET | Freq: Every morning | ORAL | Status: DC
  Administered 2014-02-27 – 2014-03-01 (×3): 20 mg via ORAL
  Filled 2014-02-27 (×3): qty 1

## 2014-02-27 MED ORDER — RISPERIDONE 1 MG PO TABS
1.0000 mg | ORAL_TABLET | Freq: Two times a day (BID) | ORAL | Status: DC | PRN
  Filled 2014-02-27: qty 1

## 2014-02-27 MED ORDER — AMLODIPINE BESYLATE 10 MG PO TABS
10.0000 mg | ORAL_TABLET | Freq: Every morning | ORAL | Status: DC
  Administered 2014-02-27 – 2014-03-01 (×3): 10 mg via ORAL
  Filled 2014-02-27 (×3): qty 1

## 2014-02-27 MED ORDER — TAMSULOSIN HCL 0.4 MG PO CAPS
0.4000 mg | ORAL_CAPSULE | Freq: Every morning | ORAL | Status: DC
  Administered 2014-02-27 – 2014-03-02 (×3): 0.4 mg via ORAL
  Filled 2014-02-27 (×4): qty 1

## 2014-02-27 MED ORDER — PIPERACILLIN-TAZOBACTAM 3.375 G IVPB 30 MIN
3.3750 g | Freq: Once | INTRAVENOUS | Status: AC
  Administered 2014-02-27: 3.375 g via INTRAVENOUS
  Filled 2014-02-27: qty 50

## 2014-02-27 MED ORDER — VANCOMYCIN HCL IN DEXTROSE 1-5 GM/200ML-% IV SOLN
1000.0000 mg | Freq: Once | INTRAVENOUS | Status: AC
  Administered 2014-02-27: 1000 mg via INTRAVENOUS
  Filled 2014-02-27: qty 200

## 2014-02-27 MED ORDER — PIPERACILLIN-TAZOBACTAM 3.375 G IVPB
3.3750 g | Freq: Three times a day (TID) | INTRAVENOUS | Status: DC
  Administered 2014-02-27 – 2014-03-02 (×8): 3.375 g via INTRAVENOUS
  Filled 2014-02-27 (×9): qty 50

## 2014-02-27 MED ORDER — DOCUSATE SODIUM 100 MG PO CAPS
100.0000 mg | ORAL_CAPSULE | Freq: Two times a day (BID) | ORAL | Status: DC
  Administered 2014-02-27 – 2014-02-28 (×2): 100 mg via ORAL
  Filled 2014-02-27 (×8): qty 1

## 2014-02-27 MED ORDER — BUPROPION HCL ER (SR) 100 MG PO TB12
100.0000 mg | ORAL_TABLET | Freq: Every morning | ORAL | Status: DC
  Administered 2014-02-27 – 2014-02-28 (×2): 100 mg via ORAL
  Filled 2014-02-27 (×3): qty 1

## 2014-02-27 MED ORDER — ONDANSETRON HCL 4 MG PO TABS: 4.0000 mg | ORAL_TABLET | Freq: Four times a day (QID) | ORAL | Status: DC | PRN

## 2014-02-27 MED ORDER — SODIUM CHLORIDE 0.9 % IV SOLN
INTRAVENOUS | Status: AC
  Administered 2014-02-28: 01:00:00 via INTRAVENOUS

## 2014-02-27 MED ORDER — INSULIN ASPART 100 UNIT/ML ~~LOC~~ SOLN: 0.0000 [IU] | Freq: Every day | SUBCUTANEOUS | Status: DC

## 2014-02-27 MED ORDER — CLOPIDOGREL BISULFATE 75 MG PO TABS
75.0000 mg | ORAL_TABLET | Freq: Every day | ORAL | Status: DC
  Administered 2014-02-28: 75 mg via ORAL
  Filled 2014-02-27 (×4): qty 1

## 2014-02-27 MED ORDER — ATORVASTATIN CALCIUM 10 MG PO TABS
10.0000 mg | ORAL_TABLET | Freq: Every evening | ORAL | Status: DC
  Administered 2014-02-27: 10 mg via ORAL
  Filled 2014-02-27 (×3): qty 1

## 2014-02-27 MED ORDER — INSULIN ASPART 100 UNIT/ML ~~LOC~~ SOLN
0.0000 [IU] | Freq: Three times a day (TID) | SUBCUTANEOUS | Status: DC
  Administered 2014-02-27: 9 [IU] via SUBCUTANEOUS

## 2014-02-27 MED ORDER — SODIUM CHLORIDE 0.9 % IV SOLN
500.0000 mg | Freq: Two times a day (BID) | INTRAVENOUS | Status: DC
  Administered 2014-02-27 – 2014-02-28 (×3): 500 mg via INTRAVENOUS
  Filled 2014-02-27 (×4): qty 5

## 2014-02-27 MED ORDER — LEVETIRACETAM 500 MG PO TABS
500.0000 mg | ORAL_TABLET | Freq: Two times a day (BID) | ORAL | Status: DC
  Administered 2014-02-27: 500 mg via ORAL
  Filled 2014-02-27 (×2): qty 1

## 2014-02-27 MED ORDER — LORATADINE 10 MG PO TABS
10.0000 mg | ORAL_TABLET | Freq: Every day | ORAL | Status: DC
  Administered 2014-02-28: 10 mg via ORAL
  Filled 2014-02-27 (×4): qty 1

## 2014-02-27 MED ORDER — FENTANYL 25 MCG/HR TD PT72: 25.0000 ug | MEDICATED_PATCH | TRANSDERMAL | Status: DC

## 2014-02-27 MED ORDER — ALBUTEROL SULFATE (2.5 MG/3ML) 0.083% IN NEBU: 2.5000 mg | INHALATION_SOLUTION | RESPIRATORY_TRACT | Status: DC | PRN

## 2014-02-27 MED ORDER — MEMANTINE HCL ER 7 MG PO CP24
28.0000 mg | ORAL_CAPSULE | Freq: Every morning | ORAL | Status: DC
  Administered 2014-02-27 – 2014-03-02 (×4): 28 mg via ORAL
  Filled 2014-02-27 (×4): qty 4

## 2014-02-27 NOTE — H&P (Addendum)
[facility] History and Physical  [name] FBP:[id number] DOB: 05-Feb-1931 DOA: 02/27/2014  Referring physician: ER physician PCP: Cathlean Cower, MD   Chief Complaint: mental status changes  HPI:  80 year old female with past medical history of dementia, anemia, dyslipidemia, hypertension, depression, recent admission for pubic rami fracture (discharged 02/13/2014) and also had UTI during that admission who now presented to [facility] ED 02/27/2014 with altered mental status for past 1 day prior to this admission. Family is not present at the bedside to give details of medical history. Pt apparently also had a productive cough and fever. No vomiting and no shortness of breath. No reports of blood in stool or urine. In ED, BP was 153/71, HR 90-101, RR 16-25, T max 102.4 F and oxygen saturation was 90% on 2 L [place] oxygen support. CXR showed new right perihilar airspace opacity compatible with pneumonia or aspiration pneumonitis. She was started on broad spectrum abx coverage, vanco and zosyn. Blood work revealed WBC count 12.2, hemoglobin 10, creatinine 1.52.  Assessment & Plan    Principal Problem:   Acute respiratory failure with hypoxia / HCAP/ Aspiration pneumonitis - started broad spectrum abx coverage with zosyn and vancomycin - follow up blood culture results, legionella and strep pneumonia results  - may use albuterol and Atrovent BD as needed every 2 hours for shortness of breath or wheezing  - oxygen support via [place] to keep O2 saturation above 90% - needs SLP evaluation - order in place  Active Problems:   Sepsis - secondary to HCAP or aspiration pneumonia - met criteria of sepsis with fever, tachycardia, tachypnea and WBC count 12.2, evidence of pneumonia on CXR - on vanco and zosyn - follow up blood and urine culture results; follow up resp culture results    HYPERLIPIDEMIA - continue statin therapy    ANEMIA-IRON DEFICIENCY - continue ferrous sulfate supplementation - hemoglobin  is 10.5 - current indications for transfusion    DEPRESSION - continue Wellbutrin and celexa   HYPERTENSION - continue Norvasc 10 mg daily   GERD - continue protonix daily   BACK PAIN - has fentanyl patch   Dementia - continue memantine    Protein-calorie malnutrition, severe - nutrition consulted    Seizure - continue Keppra   Diabetes - hold metformin due to renal insufficiency - SSI ordered    CKD (chronic kidney disease) stage 3, GFR 30-59 ml/min - baseline creatinine 1.16 most recently, now creatinine is 1.52   DVT prophylaxis: SCD bilaterally.   Radiological Exams on Admission: Dg Chest Port 1 View 02/27/2014    IMPRESSION: New right perihilar airspace opacity compatible with pneumonia or aspiration pneumonitis. Followup in 4-6 weeks to ensure radiographic clearing and exclude an underlying lesion is recommended.      Code Status: DNR/DNI Family Communication: Family not at the bedside Disposition Plan: Admit for further evaluation  Leisa Lenz, MD  [facility] Pager [phone]  Review of Systems:  Unable to obtain due to dementia.  Past Medical History  Diagnosis Date  . COLONIC POLYPS 06/15/2005  . DIABETES MELLITUS, TYPE II 04/22/2007  . HYPERCHOLESTEROLEMIA   . HYPERLIPIDEMIA   . OBESITY   . ANEMIA-IRON DEFICIENCY   . DEPRESSION   . HYPERTENSION   . ALLERGIC RHINITIS   . ESOPHAGEAL STRICTURE   . GERD   . HIATAL HERNIA   . DIVERTICULOSIS, COLON   . BACK PAIN   . OSTEOPOROSIS   . DIZZINESS   . WEIGHT LOSS   .  DYSPNEA   . Other dysphagia   . COLONIC POLYPS, HX OF 04/03/2002 & 06/15/2005    TUBULAR ADENOMA  . History of CVA (cerebrovascular accident) 07/29/2012    Noted old, to right thalamus and pons by Head CT Jul 27, 2012  . Dementia 09/17/2012  . Type II or unspecified type diabetes mellitus without mention of complication, uncontrolled 07/16/2012  . Movement disorder    Past Surgical History  Procedure Laterality Date  . Appendectomy     . Abdominal hysterectomy    . Tonsillectomy and adenoidectomy    . Left ankle surgury    . S/p bladder pubovaginal sling    . S/p cystocele/rectocele repair    . Laparoscopic takedown of incarcerated stomach within the chest/nissen  2009  . Esophageal manometry  12/21/2011    Procedure: ESOPHAGEAL MANOMETRY (EM);  Surgeon: Sable Feil, MD;  Location: [facility];  Service: Endoscopy;  Laterality: N/A;   Social History:  reports that she has never smoked. She has never used smokeless tobacco. She reports that she does not drink alcohol or use illicit drugs.  Allergies  Allergen Reactions  . Other Nausea And Vomiting    ANESTHESIA AND STRONG PAIN MEDS-VERY SENSITIVE  . Codeine Nausea And Vomiting and Rash  . Hydrocodone-Acetaminophen Nausea And Vomiting and Rash  . Keflex [Cephalexin] Rash and Other (See Comments)    Tolerated Rocephin 02/13/14, TDD  . Oxycodone-Acetaminophen Nausea And Vomiting and Rash    Family History:  Family History  Problem Relation Age of Onset  . Heart disease Mother 26  . Diabetes Mother   . Heart attack Mother   . Colon cancer Father   . Diabetes Father      Prior to Admission medications   Medication Sig Start Date End Date Taking? Authorizing Provider  amLODipine (NORVASC) 10 MG tablet Take 10 mg by mouth every morning.    Yes Historical Provider, MD  atorvastatin (LIPITOR) 10 MG tablet Take 10 mg by mouth every evening.    Yes Historical Provider, MD  buPROPion (WELLBUTRIN SR) 100 MG 12 hr tablet Take 100 mg by mouth every morning.   Yes Historical Provider, MD  cetirizine (ZYRTEC) 10 MG tablet Take 10 mg by mouth every morning.    Yes Historical Provider, MD  citalopram (CELEXA) 10 MG tablet Take 10 mg by mouth every morning.   Yes Historical Provider, MD  clonazePAM (KLONOPIN) 0.25 MG disintegrating tablet Take 0.25 mg by mouth 2 (two) times daily as needed (agitation, anxiety).   Yes Historical Provider, MD  clopidogrel (PLAVIX) 75 MG  tablet Take 75 mg by mouth daily with breakfast.   Yes Historical Provider, MD  docusate sodium (COLACE) 100 MG capsule Take 100 mg by mouth 2 (two) times daily.   Yes Historical Provider, MD  fentaNYL (DURAGESIC - DOSED MCG/HR) 25 MCG/HR patch Place 25 mcg onto the skin every 3 (three) days.   Yes Historical Provider, MD  ferrous sulfate 325 (65 FE) MG tablet Take 325 mg by mouth 3 (three) times daily with meals.   Yes Historical Provider, MD  furosemide (LASIX) 20 MG tablet Take 20 mg by mouth every morning.   Yes Historical Provider, MD  insulin glargine (LANTUS) 100 UNIT/ML injection Inject 20 Units into the skin every morning.    Yes Historical Provider, MD  levETIRAcetam (KEPPRA) 500 MG tablet Take 500 mg by mouth 2 (two) times daily.  02/08/14  Yes Biagio Borg, MD  Memantine HCl ER (NAMENDA XR)  28 MG CP24 Take 28 mg by mouth every morning.   Yes Historical Provider, MD  metFORMIN (GLUCOPHAGE) 500 MG tablet Take 500 mg by mouth 2 (two) times daily with a meal.   Yes Historical Provider, MD  omeprazole (PRILOSEC) 20 MG capsule Take 20 mg by mouth every morning.   Yes Historical Provider, MD  tamsulosin (FLOMAX) 0.4 MG CAPS capsule Take 0.4 mg by mouth every morning.   Yes Historical Provider, MD  risperiDONE (RISPERDAL) 1 MG tablet Take 1 mg by mouth 2 (two) times daily as needed (anxiety, agitation).    Historical Provider, MD   Physical Exam: Filed Vitals:   02/27/14 0957 02/27/14 1003 02/27/14 1029 02/27/14 1030  BP:  162/56  165/62  Pulse:  101  99  Temp:  102.4 F (39.1 C)    TempSrc:  Rectal    Resp:  25  22  SpO2: 91% 100% 91% 90%    Physical Exam  Constitutional: Appears well-developed and well-nourished. No distress.  HENT: Normocephalic. No tonsillar erythema or exudates Eyes: Conjunctivae and EOM are normal. PERRLA, no scleral icterus.  Neck: Normal ROM. Neck supple. No JVD. No tracheal deviation. No thyromegaly.  CVS: RRR, S1/S2 appreciated, SEM appreciated  Pulmonary:  diminished breath sounds bilaterally, mild wheezing in mid and upper lung lobes Abdominal: Soft. BS +,  no distension, tenderness, rebound or guarding.  Musculoskeletal: Normal range of motion. No edema and no tenderness.  Lymphadenopathy: No lymphadenopathy noted, cervical, inguinal. Neuro: Alert. No focal neurologic deficits.  Skin: Skin is warm and dry. No rash noted. Psychiatric: Normal mood and affect.   Labs on Admission:  Basic Metabolic Panel:  Recent Labs Lab 02/27/14 1026 02/27/14 1038  NA 143 144  K 4.4 4.2  CL 102 105  CO2 21  --   GLUCOSE 289* 302*  BUN 30* 31*  CREATININE 1.52* 1.60*  CALCIUM 9.5  --    Liver Function Tests:  Recent Labs Lab 02/27/14 1026  AST 14  ALT 12  ALKPHOS 126*  BILITOT 0.2*  PROT 7.0  ALBUMIN 3.2*   No results found for this basename: LIPASE, AMYLASE,  in the last 168 hours No results found for this basename: AMMONIA,  in the last 168 hours CBC:  Recent Labs Lab 02/27/14 1026 02/27/14 1038  WBC 12.2*  --   HGB 10.0* 10.5*  HCT 30.7* 31.0*  MCV 91.9  --   PLT 364  --    Cardiac Enzymes:  Recent Labs Lab 02/27/14 1026  TROPONINI <0.30   BNP: No components found with this basename: POCBNP,  CBG: No results found for this basename: GLUCAP,  in the last 168 hours  If 7PM-7AM, please contact night-coverage [email or web address] Password [facility] 02/27/2014, 12:16 PM

## 2014-02-27 NOTE — ED Provider Notes (Signed)
CSN: [id number]     Arrival date & time 02/27/14  8469 History   First MD Initiated Contact with Patient 02/27/14 1023     Chief Complaint  Patient presents with  . Altered Mental Status   Level V caveat due to altered mental status.  (Consider location/radiation/quality/duration/timing/severity/associated sxs/prior Treatment) Patient is a 80 y.o. female presenting with altered mental status. The history is provided by the patient and the EMS personnel.  Altered Mental Status  patient presents with altered mental status. Family called for unresponsiveness. Found to be febrile upon arrival. EMS removed and a patch and give Narcan. Had slight improvement, but no improvement with second dose.  Past Medical History  Diagnosis Date  . COLONIC POLYPS 06/15/2005  . DIABETES MELLITUS, TYPE II 04/22/2007  . HYPERCHOLESTEROLEMIA   . HYPERLIPIDEMIA   . OBESITY   . ANEMIA-IRON DEFICIENCY   . DEPRESSION   . HYPERTENSION   . ALLERGIC RHINITIS   . ESOPHAGEAL STRICTURE   . GERD   . HIATAL HERNIA   . DIVERTICULOSIS, COLON   . BACK PAIN   . OSTEOPOROSIS   . DIZZINESS   . WEIGHT LOSS   . DYSPNEA   . Other dysphagia   . COLONIC POLYPS, HX OF 04/03/2002 & 06/15/2005    TUBULAR ADENOMA  . History of CVA (cerebrovascular accident) 07/29/2012    Noted old, to right thalamus and pons by Head CT Jul 27, 2012  . Dementia 09/17/2012  . Type II or unspecified type diabetes mellitus without mention of complication, uncontrolled 07/16/2012  . Movement disorder    Past Surgical History  Procedure Laterality Date  . Appendectomy    . Abdominal hysterectomy    . Tonsillectomy and adenoidectomy    . Left ankle surgury    . S/p bladder pubovaginal sling    . S/p cystocele/rectocele repair    . Laparoscopic takedown of incarcerated stomach within the chest/nissen  2009  . Esophageal manometry  12/21/2011    Procedure: ESOPHAGEAL MANOMETRY (EM);  Surgeon: Sable Feil, MD;  Location: [facility];   Service: Endoscopy;  Laterality: N/A;   Family History  Problem Relation Age of Onset  . Heart disease Mother 29  . Diabetes Mother   . Heart attack Mother   . Colon cancer Father   . Diabetes Father    History  Substance Use Topics  . Smoking status: Never Smoker   . Smokeless tobacco: Never Used  . Alcohol Use: No   OB History   Grav Para Term Preterm Abortions TAB SAB Ect Mult Living                 Review of Systems  Unable to perform ROS     Allergies  Other; Codeine; Hydrocodone-acetaminophen; Keflex; and Oxycodone-acetaminophen  Home Medications   Prior to Admission medications   Medication Sig Start Date End Date Taking? Authorizing Landan Fedie  amLODipine (NORVASC) 10 MG tablet Take 10 mg by mouth every morning.    Yes Historical Jamiel Goncalves, MD  atorvastatin (LIPITOR) 10 MG tablet Take 10 mg by mouth every evening.    Yes Historical Marceline Napierala, MD  buPROPion (WELLBUTRIN SR) 100 MG 12 hr tablet Take 100 mg by mouth every morning.   Yes Historical Shashana Fullington, MD  cetirizine (ZYRTEC) 10 MG tablet Take 10 mg by mouth every morning.    Yes Historical Mehek Grega, MD  citalopram (CELEXA) 10 MG tablet Take 10 mg by mouth every morning.   Yes Historical Breslin Hemann, MD  clonazePAM (  KLONOPIN) 0.25 MG disintegrating tablet Take 0.25 mg by mouth 2 (two) times daily as needed (agitation, anxiety).   Yes Historical Kealie Barrie, MD  clopidogrel (PLAVIX) 75 MG tablet Take 75 mg by mouth daily with breakfast.   Yes Historical Colm Lyford, MD  docusate sodium (COLACE) 100 MG capsule Take 100 mg by mouth 2 (two) times daily.   Yes Historical Suhaila Troiano, MD  fentaNYL (DURAGESIC - DOSED MCG/HR) 25 MCG/HR patch Place 25 mcg onto the skin every 3 (three) days.   Yes Historical Kaylina Cahue, MD  ferrous sulfate 325 (65 FE) MG tablet Take 325 mg by mouth 3 (three) times daily with meals.   Yes Historical Maleigha Colvard, MD  furosemide (LASIX) 20 MG tablet Take 20 mg by mouth every morning.   Yes Historical Jarome Trull, MD   insulin glargine (LANTUS) 100 UNIT/ML injection Inject 20 Units into the skin every morning.    Yes Historical Annaliyah Willig, MD  levETIRAcetam (KEPPRA) 500 MG tablet Take 500 mg by mouth 2 (two) times daily.  02/08/14  Yes Biagio Borg, MD  Memantine HCl ER (NAMENDA XR) 28 MG CP24 Take 28 mg by mouth every morning.   Yes Historical Presley Summerlin, MD  metFORMIN (GLUCOPHAGE) 500 MG tablet Take 500 mg by mouth 2 (two) times daily with a meal.   Yes Historical Pelham Hennick, MD  omeprazole (PRILOSEC) 20 MG capsule Take 20 mg by mouth every morning.   Yes Historical Carolann Brazell, MD  tamsulosin (FLOMAX) 0.4 MG CAPS capsule Take 0.4 mg by mouth every morning.   Yes Historical Colson Barco, MD  risperiDONE (RISPERDAL) 1 MG tablet Take 1 mg by mouth 2 (two) times daily as needed (anxiety, agitation).    Historical Tarrence Enck, MD   BP 157/49  Pulse 90  Temp(Src) 99.8 F (37.7 C) (Oral)  Resp 16  Ht 5\' 2"  (1.575 m)  Wt 121 lb 11.1 oz (55.2 kg)  BMI 22.25 kg/m2  SpO2 98% Physical Exam  Constitutional: She appears well-developed.  HENT:  Head: Normocephalic and atraumatic.  Eyes:  Pupils mildly constricted  Cardiovascular:  Mild tachycardia  Pulmonary/Chest:  Shallow breath sounds with tachypnea  Abdominal: She exhibits no distension. There is no tenderness.  Musculoskeletal: She exhibits no edema.  Neurological:  Patient is minimally verbal. She is somewhat confused. She will answer some questions but I am having difficulty understanding it to. She'll follow a few simple commands.  Skin: Skin is warm.  Skin is hot    ED Course  Procedures (including critical care time) Labs Review Labs Reviewed  CBC - Abnormal; Notable for the following:    WBC 12.2 (*)    RBC 3.34 (*)    Hemoglobin 10.0 (*)    HCT 30.7 (*)    All other components within normal limits  COMPREHENSIVE METABOLIC PANEL - Abnormal; Notable for the following:    Glucose, Bld 289 (*)    BUN 30 (*)    Creatinine, Ser 1.52 (*)    Albumin 3.2  (*)    Alkaline Phosphatase 126 (*)    Total Bilirubin 0.2 (*)    GFR calc non Af Amer 31 (*)    GFR calc Af Amer 36 (*)    All other components within normal limits  URINALYSIS, ROUTINE W REFLEX MICROSCOPIC - Abnormal; Notable for the following:    Glucose, UA 100 (*)    Leukocytes, UA TRACE (*)    All other components within normal limits  COMPREHENSIVE METABOLIC PANEL - Abnormal; Notable for the following:  Glucose, Bld 374 (*)    BUN 30 (*)    Creatinine, Ser 1.52 (*)    Albumin 3.2 (*)    Alkaline Phosphatase 123 (*)    GFR calc non Af Amer 31 (*)    GFR calc Af Amer 36 (*)    All other components within normal limits  CBC WITH DIFFERENTIAL - Abnormal; Notable for the following:    WBC 13.4 (*)    RBC 3.35 (*)    Hemoglobin 10.0 (*)    HCT 30.9 (*)    Neutrophils Relative % 87 (*)    Neutro Abs 11.6 (*)    Lymphocytes Relative 8 (*)    All other components within normal limits  GLUCOSE, CAPILLARY - Abnormal; Notable for the following:    Glucose-Capillary 277 (*)    All other components within normal limits  I-STAT CHEM 8, ED - Abnormal; Notable for the following:    BUN 31 (*)    Creatinine, Ser 1.60 (*)    Glucose, Bld 302 (*)    Hemoglobin 10.5 (*)    HCT 31.0 (*)    All other components within normal limits  URINE CULTURE  CULTURE, BLOOD (ROUTINE X 2)  CULTURE, BLOOD (ROUTINE X 2)  URINE CULTURE  CULTURE, EXPECTORATED SPUTUM-ASSESSMENT  TROPONIN I  URINE MICROSCOPIC-ADD ON  MAGNESIUM  PHOSPHORUS  APTT  PROTIME-INR  LEGIONELLA ANTIGEN, URINE  STREP PNEUMONIAE URINARY ANTIGEN  TSH  HEMOGLOBIN A1C  CBG MONITORING, ED  I-STAT CG4 LACTIC ACID, ED    Imaging Review Dg Chest Port 1 View  02/27/2014   CLINICAL DATA:  Altered mental status.  Fever.  EXAM: PORTABLE CHEST - 1 VIEW  COMPARISON:  02/08/2014.  10/07/2013.  FINDINGS: New right perihilar and infrahilar airspace opacity is present, which may represent pneumonia or aspiration pneumonitis. Pulmonary  edema considered unlikely. The cardiopericardial silhouette is within normal limits. Aortic arch and mediastinal contours appear unchanged. Monitoring leads project over the chest. Healed proximal left humerus fracture.  IMPRESSION: New right perihilar airspace opacity compatible with pneumonia or aspiration pneumonitis. Followup in 4-6 weeks to ensure radiographic clearing and exclude an underlying lesion is recommended.   Electronically Signed   By: Dereck Ligas M.D.   On: 02/27/2014 11:13     EKG Interpretation   Date/Time:  Tuesday February 27 2014 10:06:11 EDT Ventricular Rate:  101 PR Interval:  188 QRS Duration: 95 QT Interval:  378 QTC Calculation: 490 R Axis:   -11 Text Interpretation:  Sinus tachycardia Low voltage, precordial leads  Borderline repolarization abnormality Borderline prolonged QT interval  Confirmed by Alvino Chapel  MD, Ovid Curd [id number]) on 02/27/2014 10:41:10 AM      MDM   Final diagnoses:  HCAP (healthcare-associated pneumonia)    Patient with altered mental status. Tachycardia. Pneumonia on x-ray. Will admit to internal medicine. Treated for HCAP.Marland Kitchen Has improved somewhat with IV fluids.    Jasper Riling. Alvino Chapel, MD 02/27/14 [id number]

## 2014-02-27 NOTE — ED Notes (Signed)
Per EMS- Patient is from home. Family called and stated the patient was unresponsive. Upon arrival EMS removed Fentanyl patch and gave Narcan 0.4 mg with slight improvement and then repeated with no changes. Family reports history of UTI's. EMS stated Rhonchi with yellow brown sputum.

## 2014-02-27 NOTE — Progress Notes (Signed)
ANTIBIOTIC CONSULT NOTE - INITIAL  Pharmacy Consult for vancomcyin/zosyn Indication: rule out pneumonia  Allergies  Allergen Reactions  . Other Nausea And Vomiting    ANESTHESIA AND STRONG PAIN MEDS-VERY SENSITIVE  . Codeine Nausea And Vomiting and Rash  . Hydrocodone-Acetaminophen Nausea And Vomiting and Rash  . Keflex [Cephalexin] Rash and Other (See Comments)    Tolerated Rocephin 02/13/14, TDD  . Oxycodone-Acetaminophen Nausea And Vomiting and Rash    Patient Measurements:   Adjusted Body Weight:   Vital Signs: Temp: 102.4 F (39.1 C) (06/23 1003) Temp src: Rectal (06/23 1003) BP: 168/80 mmHg (06/23 1230) Pulse Rate: 92 (06/23 1230) Intake/Output from previous day:   Intake/Output from this shift:    Labs:  Recent Labs  02/27/14 1026 02/27/14 1038  WBC 12.2*  --   HGB 10.0* 10.5*  PLT 364  --   CREATININE 1.52* 1.60*   The CrCl is unknown because both a height and weight (above a minimum accepted value) are required for this calculation. No results found for this basename: Letta Median, VANCORANDOM, GENTTROUGH, GENTPEAK, GENTRANDOM, TOBRATROUGH, TOBRAPEAK, TOBRARND, AMIKACINPEAK, AMIKACINTROU, AMIKACIN,  in the last 72 hours   Microbiology: Recent Results (from the past 720 hour(s))  URINE CULTURE     Status: None   Collection Time    02/10/14  5:47 PM      Result Value Ref Range Status   Specimen Description URINE, CATHETERIZED   Final   Special Requests NONE   Final   Culture  Setup Time     Final   Value: 02/11/2014 16:24     Performed at [place]     Final   Value: >=100,000 COLONIES/ML     Performed at [facility]   Culture     Final   Value: ESCHERICHIA COLI     Performed at [facility]   Report Status 02/13/2014 FINAL   Final   Organism ID, Bacteria ESCHERICHIA COLI   Final    Medical History: Past Medical History  Diagnosis Date  . COLONIC POLYPS 06/15/2005  . DIABETES MELLITUS, TYPE  II 04/22/2007  . HYPERCHOLESTEROLEMIA   . HYPERLIPIDEMIA   . OBESITY   . ANEMIA-IRON DEFICIENCY   . DEPRESSION   . HYPERTENSION   . ALLERGIC RHINITIS   . ESOPHAGEAL STRICTURE   . GERD   . HIATAL HERNIA   . DIVERTICULOSIS, COLON   . BACK PAIN   . OSTEOPOROSIS   . DIZZINESS   . WEIGHT LOSS   . DYSPNEA   . Other dysphagia   . COLONIC POLYPS, HX OF 04/03/2002 & 06/15/2005    TUBULAR ADENOMA  . History of CVA (cerebrovascular accident) 07/29/2012    Noted old, to right thalamus and pons by Head CT Jul 27, 2012  . Dementia 09/17/2012  . Type II or unspecified type diabetes mellitus without mention of complication, uncontrolled 07/16/2012  . Movement disorder     Assessment: 4 YOF presents with altered mental status and unresponsiveness.  She was admitted earlier this month for pelvic fracture following fall from bed.  Fracture was treated conservatively.  She was started on antibiotics for E. Coli UTI duration previous admission and was to continue cephalexin for total 7-days of therapy.  6/23 >>vancomycin  >> 6/23 >>zosyn  >>    Tmax: 102.4 WBCs: 12.2 Renal: Scr = 1.6, CrCl = 60ml/min (N), 14ml/min (C-G), SCr is above baseline from previous admission  6/23 blood: 6/23 urine:  Goal of Therapy:  Vancomycin trough level 15-20 mcg/ml  Plan:   Vancomycin 1gm IV x 1 given in ED, then 500mg  IV q24h  Monitor SCr  Check vancomycin trough at steady state  Zosyn 3.375gm IV x 1 in ED, then 3.375gm IV q8h over 4hr  Monitor Urine output and SCr, change to traditional dosing if renal fx worsens.   Doreene Eland, PharmD, BCPS.   Pager: [phone]  02/27/2014,1:07 PM

## 2014-02-27 NOTE — ED Notes (Signed)
Bed: DP94 Expected date:  Expected time:  Means of arrival:  Comments: ems- elderly altered

## 2014-02-28 DIAGNOSIS — K2100 Gastro-esophageal reflux disease with esophagitis, without bleeding: Secondary | ICD-10-CM

## 2014-02-28 LAB — COMPREHENSIVE METABOLIC PANEL
ALT: 11 U/L (ref 0–35)
AST: 12 U/L (ref 0–37)
Albumin: 2.9 g/dL — ABNORMAL LOW (ref 3.5–5.2)
Alkaline Phosphatase: 106 U/L (ref 39–117)
BILIRUBIN TOTAL: 0.3 mg/dL (ref 0.3–1.2)
BUN: 30 mg/dL — ABNORMAL HIGH (ref 6–23)
CHLORIDE: 106 meq/L (ref 96–112)
CO2: 27 mEq/L (ref 19–32)
Calcium: 9.4 mg/dL (ref 8.4–10.5)
Creatinine, Ser: 1.32 mg/dL — ABNORMAL HIGH (ref 0.50–1.10)
GFR calc Af Amer: 43 mL/min — ABNORMAL LOW (ref >90)
GFR calc non Af Amer: 37 mL/min — ABNORMAL LOW (ref >90)
GLUCOSE: 180 mg/dL — AB (ref 70–99)
Potassium: 3.8 mEq/L (ref 3.7–5.3)
SODIUM: 147 meq/L (ref 137–147)
TOTAL PROTEIN: 6.8 g/dL (ref 6.0–8.3)

## 2014-02-28 LAB — URINE CULTURE
COLONY COUNT: NO GROWTH
Culture: NO GROWTH

## 2014-02-28 LAB — GLUCOSE, CAPILLARY
GLUCOSE-CAPILLARY: 253 mg/dL — AB (ref 70–99)
GLUCOSE-CAPILLARY: 215 mg/dL — AB (ref 70–99)
Glucose-Capillary: 207 mg/dL — ABNORMAL HIGH (ref 70–99)
Glucose-Capillary: 203 mg/dL — ABNORMAL HIGH (ref 70–99)
Glucose-Capillary: 228 mg/dL — ABNORMAL HIGH (ref 70–99)
Glucose-Capillary: 290 mg/dL — ABNORMAL HIGH (ref 70–99)
Glucose-Capillary: 193 mg/dL — ABNORMAL HIGH (ref 70–99)

## 2014-02-28 LAB — STREP PNEUMONIAE URINARY ANTIGEN: Strep Pneumo Urinary Antigen: NEGATIVE

## 2014-02-28 LAB — CBC
HEMATOCRIT: 29.2 % — AB (ref 36.0–46.0)
Hemoglobin: 9.2 g/dL — ABNORMAL LOW (ref 12.0–15.0)
MCH: 29.2 pg (ref 26.0–34.0)
MCHC: 31.5 g/dL (ref 30.0–36.0)
MCV: 92.7 fL (ref 78.0–100.0)
PLATELETS: 345 10*3/uL (ref 150–400)
RBC: 3.15 MIL/uL — ABNORMAL LOW (ref 3.87–5.11)
RDW: 13.9 % (ref 11.5–15.5)
WBC: 13.9 10*3/uL — ABNORMAL HIGH (ref 4.0–10.5)

## 2014-02-28 LAB — HEMOGLOBIN A1C
HEMOGLOBIN A1C: 7.7 % — AB (ref <5.7)
Mean Plasma Glucose: 174 mg/dL — ABNORMAL HIGH (ref <117)

## 2014-02-28 MED ORDER — INSULIN ASPART 100 UNIT/ML ~~LOC~~ SOLN
0.0000 [IU] | SUBCUTANEOUS | Status: DC
  Administered 2014-02-28 (×3): 3 [IU] via SUBCUTANEOUS
  Administered 2014-02-28: 2 [IU] via SUBCUTANEOUS
  Administered 2014-02-28 – 2014-03-01 (×3): 5 [IU] via SUBCUTANEOUS
  Administered 2014-03-01: 2 [IU] via SUBCUTANEOUS
  Administered 2014-03-01: 5 [IU] via SUBCUTANEOUS
  Administered 2014-03-01: 7 [IU] via SUBCUTANEOUS
  Administered 2014-03-02: 9 [IU] via SUBCUTANEOUS
  Administered 2014-03-02 (×2): 2 [IU] via SUBCUTANEOUS

## 2014-02-28 MED ORDER — GLUCERNA SHAKE PO LIQD
237.0000 mL | Freq: Three times a day (TID) | ORAL | Status: DC
  Administered 2014-02-28: 237 mL via ORAL
  Filled 2014-02-28 (×6): qty 237

## 2014-02-28 MED ORDER — SODIUM CHLORIDE 0.9 % IV SOLN
INTRAVENOUS | Status: DC
  Administered 2014-02-28 – 2014-03-02 (×4): via INTRAVENOUS

## 2014-02-28 NOTE — Evaluation (Signed)
Clinical/Bedside Swallow Evaluation Patient Details  Name: [name] MRN: [id number] Date of Birth: 1931-05-20  Today's Date: 02/28/2014 Time: 1105-1140 SLP Time Calculation (min): 35 min  Past Medical History:  Past Medical History  Diagnosis Date  . COLONIC POLYPS 06/15/2005  . DIABETES MELLITUS, TYPE II 04/22/2007  . HYPERCHOLESTEROLEMIA   . HYPERLIPIDEMIA   . OBESITY   . ANEMIA-IRON DEFICIENCY   . DEPRESSION   . HYPERTENSION   . ALLERGIC RHINITIS   . ESOPHAGEAL STRICTURE   . GERD   . HIATAL HERNIA   . DIVERTICULOSIS, COLON   . BACK PAIN   . OSTEOPOROSIS   . DIZZINESS   . WEIGHT LOSS   . DYSPNEA   . Other dysphagia   . COLONIC POLYPS, HX OF 04/03/2002 & 06/15/2005    TUBULAR ADENOMA  . History of CVA (cerebrovascular accident) 07/29/2012    Noted old, to right thalamus and pons by Head CT Jul 27, 2012  . Dementia 09/17/2012  . Type II or unspecified type diabetes mellitus without mention of complication, uncontrolled 07/16/2012  . Movement disorder    Past Surgical History:  Past Surgical History  Procedure Laterality Date  . Appendectomy    . Abdominal hysterectomy    . Tonsillectomy and adenoidectomy    . Left ankle surgury    . S/p bladder pubovaginal sling    . S/p cystocele/rectocele repair    . Laparoscopic takedown of incarcerated stomach within the chest/nissen  2009  . Esophageal manometry  12/21/2011    Procedure: ESOPHAGEAL MANOMETRY (EM);  Surgeon: Sable Feil, MD;  Location: [facility];  Service: Endoscopy;  Laterality: N/A;   HPI:  80 yo female admitted to [facility] with respiratory difficulties, CXR showed new right perihilar + intrahilar opacity consistent with aspiration pneumonitis or aspiration.  Pt has progressive dysphagia and significant weight loss.  H/o esophageal dysmotility and Nissen required prior to this admit.  Son reports pt consuming approx 300 calories a day only.  Per son, pt informed him that she did not want to eat and she  "wanted to go see her husband" - he is deceased. Swallow evaluation ordered.     Assessment / Plan / Recommendation Clinical Impression  Pt presents with clinical indications of primary esophageal deficits, has known h/o dysmotility and required Nissen previously for hiatal hernia per family.    Per family, intake has been poor and pt has suffered from significant weight loss (30 + pounds).  For the last 5-7 days pt unable to keep food down and therefore she consumes few bites/sips only (ensure, watermelon).  At times pt will cough/regurgitate and bring up chunks of watermelon per family.    Son also reports pt coughing more at night and more with intake - suspect aspiration from known esophageal issues.   Today no overt clinical indications of aspiration or severe oropharyngeal deficits noted.  However belching present after minimal intake with pt acknowledging sensation of backflow, therefore intake ceased to prevent pt from regurgitating and aspirating.    Given chronicity and progression of dysphagia, recurrent hospital admits and pt functional decline, son (Gary-HCPOA) agreeable to consideration for palliative referral (if MD agrees).    Pt reportedly does better with medication tolerance if she has something on her stomach and famliy tries to give her medicine 30 minutes after intake.  This family has been managing this pt's chronic dysphgia as best able.  SLP educated family/pt to strategies to mitigate but not prevent aspiration.  Will sign off as all education completed.  Thanks for this order.     Aspiration Risk  Severe    Diet Recommendation  (full liquid, no pudding/grits (po for comfort only))   Liquid Administration via: Cup;Straw Medication Administration: Via alternative means (as pt tolerates, ? liquid form or via IV if large) Supervision: Full supervision/cueing for compensatory strategies;Trained caregiver to feed patient;Staff to assist with self  feeding Compensations: Small sips/bites;Slow rate Postural Changes and/or Swallow Maneuvers: Seated upright 90 degrees;Upright 30-60 min after meal    Other  Recommendations Oral Care Recommendations: Oral care before and after PO   Follow Up Recommendations    n/a   Frequency and Duration   n/a     Pertinent Vitals/Pain Febrile, decreased      Swallow Study Prior Functional Status   see [place] Date of Onset: 02/28/14 HPI: 80 yo female admitted to [facility] with respiratory difficulties, CXR showed new right perihilar + intrahilar opacity consistent with aspiration pneumonitis or aspiration.  Pt has progressive dysphagia and significant weight loss.  H/o esophageal dysmotility and Nissen required prior to this admit.  Son reports pt consuming approx 300 calories a day only.  Per son, pt informed him that she did not want to eat and she "wanted to go see her husband" - he is deceased. Swallow evaluation ordered.   Type of Study: Bedside swallow evaluation Diet Prior to this Study: NPO Respiratory Status: Nasal cannula History of Recent Intubation: No Behavior/Cognition: Alert;Confused;Requires cueing;Uncooperative Oral Cavity - Dentition: Adequate natural dentition (oral cavity with dried secretions, ? food on tongue, son reports pt has been mouthbreathing lately) Self-Feeding Abilities: Needs assist;Total assist (due to tremor) Patient Positioning: Upright in bed Baseline Vocal Quality: Low vocal intensity;Hoarse Volitional Cough: Weak Volitional Swallow: Able to elicit    Oral/Motor/Sensory Function Overall Oral Motor/Sensory Function:  (no focal deficits, generalized weakness)   Ice Chips Ice chips: Not tested   Thin Liquid Thin Liquid: Impaired Presentation: Straw;Cup Oral Phase Impairments: Reduced lingual movement/coordination;Impaired anterior to posterior transit Oral Phase Functional Implications: Prolonged oral transit    Nectar Thick Nectar Thick Liquid:  Impaired Presentation: Straw Oral Phase Impairments: Impaired anterior to posterior transit Oral phase functional implications: Prolonged oral transit Other Comments: belching noted after minimal intake with pt acknowledges sensing backflow of intake, therefore intake ceased to prevent pt from regurgitating and aspirating    Honey Thick Honey Thick Liquid: Not tested   Puree Puree: Impaired Presentation: Spoon Oral Phase Impairments: Impaired anterior to posterior transit;Reduced lingual movement/coordination Oral Phase Functional Implications: Prolonged oral transit   Solid   GO    Solid: Not tested Other Comments: due to aspiration risk       Luanna Salk, [facility] SLP [phone]

## 2014-02-28 NOTE — Progress Notes (Signed)
[facility] PROGRESS NOTE  [name] JQB:[id number] DOB: 04-Jul-1931 DOA: 02/27/2014 PCP: Cathlean Cower, MD  Assessment/Plan: Acute respiratory failure with hypoxia / HCAP/ Aspiration pneumonitis - started broad spectrum abx coverage with zosyn and vancomycin  - follow up blood culture results, legionella and strep pneumonia results  - On albuterol and Atrovent BD as needed every 2 hours for shortness of breath or wheezing  - oxygen support via Malin to keep O2 saturation above 90%  - SLP consulted Sepsis  - secondary to HCAP vs aspiration pneumonia  - met criteria of sepsis with fever, tachycardia, tachypnea and WBC count 12.2, evidence of pneumonia on CXR  - on vanco and zosyn  - follow up blood and urine culture results; follow up resp culture results  HYPERLIPIDEMIA  - continue statin therapy  ANEMIA-IRON DEFICIENCY  - continue ferrous sulfate supplementation  - hemoglobin is 10.5  - current indications for transfusion  DEPRESSION  - continue Wellbutrin and celexa  HYPERTENSION  - continue Norvasc 10 mg daily  GERD  - continue protonix daily  BACK PAIN  - has fentanyl patch  Dementia  - continue memantine  Protein-calorie malnutrition, severe  - nutrition consulted  Seizure  - continue Keppra  Diabetes  - hold metformin due to renal insufficiency  - SSI ordered  CKD (chronic kidney disease) stage 3, GFR 30-59 ml/min  - baseline creatinine 1.16 most recently, now creatinine is 1.32 DVT prophylaxis: SCD bilaterally.    Code Status: DNR Family Communication: Pt, son, and daughter in-law in room (indicate person spoken with, relationship, and if by phone, the number) Disposition Plan: Pending   Consultants:  Palliative Care  Procedures:    Antibiotics:  Vancomycin 02/27/14>>>  Zosyn 02/27/14>>>  HPI/Subjective: No acute events noted overnight. Pt reports difficulty swallowing  Objective: Filed Vitals:   02/27/14 1230 02/27/14 1331 02/27/14 2240  02/28/14 0500  BP: 168/80 157/49 139/65 129/70  Pulse: 92 90 89 85  Temp:  99.8 F (37.7 C) 99 F (37.2 C) 98.8 F (37.1 C)  TempSrc:  Oral Axillary Oral  Resp: _0 Height:  _1  (1.575 m)    Weight:  121 lb 11.1 oz (55.2 kg)  120 lb 8 oz (54.658 kg)  SpO2: 97% 98% 99% 98%    Intake/Output Summary (Last 24 hours) at 02/28/14 1108 Last data filed at 02/28/14 0600  Gross per 24 hour  Intake   1100 ml  Output      0 ml  Net   1100 ml   Filed Weights   02/27/14 1331 02/28/14 0500  Weight: 121 lb 11.1 oz (55.2 kg) 120 lb 8 oz (54.658 kg)    Exam:   General:  Awake, in nad  Cardiovascular: regular, s1, s2  Respiratory: normal resp effort, no wheezing  Abdomen: soft,nondistended  Musculoskeletal: perfused, no clubbing   Data Reviewed: Basic Metabolic Panel:  Recent Labs Lab 02/27/14 1026 02/27/14 1038 02/27/14 1450 02/28/14 0442  NA 143 144 142 147  K 4.4 4.2 4.5 3.8  CL 102 105 101 106  CO2 21  --  23 27  GLUCOSE 289* 302* 374* 180*  BUN 30* 31* 30* 30*  CREATININE 1.52* 1.60* 1.52* 1.32*  CALCIUM 9.5  --  9.3 9.4  MG  --   --  2.1  --   PHOS  --   --  3.9  --    Liver Function Tests:  Recent Labs Lab 02/27/14 1026 02/27/14 1450 02/28/14  0442  AST _0 ALT _1 ALKPHOS 126* 123* 106  BILITOT 0.2* 0.3 0.3  PROT 7.0 7.1 6.8  ALBUMIN 3.2* 3.2* 2.9*   No results found for this basename: LIPASE, AMYLASE,  in the last 168 hours No results found for this basename: AMMONIA,  in the last 168 hours CBC:  Recent Labs Lab 02/27/14 1026 02/27/14 1038 02/27/14 1450 02/28/14 0442  WBC 12.2*  --  13.4* 13.9*  NEUTROABS  --   --  11.6*  --   HGB 10.0* 10.5* 10.0* 9.2*  HCT 30.7* 31.0* 30.9* 29.2*  MCV 91.9  --  92.2 92.7  PLT 364  --  360 345   Cardiac Enzymes:  Recent Labs Lab 02/27/14 1026  TROPONINI <0.30   BNP (last 3 results)  Recent Labs  05/22/13 1640 10/13/13 1305 10/16/13 0423  PROBNP 173.3 1431.0* 718.6*    CBG:  Recent Labs Lab 02/27/14 1728 02/27/14 2247 02/28/14 0137 02/28/14 0449 02/28/14 0747  GLUCAP 372* 318* 253* 207* 203*    Recent Results (from the past 240 hour(s))  URINE CULTURE     Status: None   Collection Time    02/27/14 10:28 AM      Result Value Ref Range Status   Specimen Description URINE, CATHETERIZED   Final   Special Requests NONE   Final   Culture  Setup Time     Final   Value: 02/27/2014 13:11     Performed at [facility] Count     Final   Value: NO GROWTH     Performed at [facility]   Culture     Final   Value: NO GROWTH     Performed at [facility]   Report Status 02/28/2014 FINAL   Final  CULTURE, BLOOD (ROUTINE X 2)     Status: None   Collection Time    02/27/14 10:29 AM      Result Value Ref Range Status   Specimen Description BLOOD RIGHT HAND   Final   Special Requests BOTTLES DRAWN AEROBIC AND ANAEROBIC 1CC EACH   Final   Culture  Setup Time     Final   Value: 02/27/2014 13:02     Performed at [facility]   Culture     Final   Value:        BLOOD CULTURE RECEIVED NO GROWTH TO DATE CULTURE WILL BE HELD FOR 5 DAYS BEFORE ISSUING A FINAL NEGATIVE REPORT     Performed at [facility]   Report Status PENDING   Incomplete  CULTURE, BLOOD (ROUTINE X 2)     Status: None   Collection Time    02/27/14 10:32 AM      Result Value Ref Range Status   Specimen Description BLOOD LEFT HAND   Final   Special Requests BOTTLES DRAWN AEROBIC AND ANAEROBIC [facility] EACH   Final   Culture  Setup Time     Final   Value: 02/27/2014 13:02     Performed at [facility]   Culture     Final   Value:        BLOOD CULTURE RECEIVED NO GROWTH TO DATE CULTURE WILL BE HELD FOR 5 DAYS BEFORE ISSUING A FINAL NEGATIVE REPORT     Performed at [facility]   Report Status PENDING   Incomplete     Studies: Dg Chest Port 1 View  02/27/2014  CLINICAL DATA:  Altered mental status.  Fever.  EXAM: PORTABLE  CHEST - 1 VIEW  COMPARISON:  02/08/2014.  10/07/2013.  FINDINGS: New right perihilar and infrahilar airspace opacity is present, which may represent pneumonia or aspiration pneumonitis. Pulmonary edema considered unlikely. The cardiopericardial silhouette is within normal limits. Aortic arch and mediastinal contours appear unchanged. Monitoring leads project over the chest. Healed proximal left humerus fracture.  IMPRESSION: New right perihilar airspace opacity compatible with pneumonia or aspiration pneumonitis. Followup in 4-6 weeks to ensure radiographic clearing and exclude an underlying lesion is recommended.   Electronically Signed   By: Dereck Ligas M.D.   On: 02/27/2014 11:13    Scheduled Meds: . sodium chloride   Intravenous STAT  . amLODipine  10 mg Oral q morning - 10a  . atorvastatin  10 mg Oral QPM  . buPROPion  100 mg Oral q morning - 10a  . citalopram  10 mg Oral q morning - 10a  . clopidogrel  75 mg Oral Q breakfast  . docusate sodium  100 mg Oral BID  . ferrous sulfate  325 mg Oral TID WC  . furosemide  20 mg Oral q morning - 10a  . insulin aspart  0-9 Units Subcutaneous Q4H  . levETIRAcetam  500 mg Intravenous Q12H  . loratadine  10 mg Oral Daily  . Memantine HCl ER  28 mg Oral q morning - 10a  . pantoprazole  40 mg Oral Daily  . piperacillin-tazobactam (ZOSYN)  IV  3.375 g Intravenous 3 times per day  . sodium chloride  3 mL Intravenous Q12H  . tamsulosin  0.4 mg Oral q morning - 10a  . vancomycin  500 mg Intravenous Q24H   Continuous Infusions: . sodium chloride    . sodium chloride 75 mL/hr at 02/28/14 1057    Principal Problem:   Acute respiratory failure with hypoxia Active Problems:   HYPERLIPIDEMIA   ANEMIA-IRON DEFICIENCY   DEPRESSION   HYPERTENSION   GERD   BACK PAIN   Dementia   Protein-calorie malnutrition, severe   Seizure   HCAP (healthcare-associated pneumonia)   Diabetes   CKD (chronic kidney disease) stage 3, GFR 30-59 ml/min  Time  spent: 64mn  [facility], [facility] Pager [phone] If 7PM-7AM, please contact night-coverage at [email or web address], password [facility]6/24/2015, 11:08 AM  LOS: 1 day

## 2014-02-28 NOTE — Progress Notes (Signed)
INITIAL NUTRITION ASSESSMENT  DOCUMENTATION CODES Per approved criteria  -Severe malnutrition in the context of chronic illness   INTERVENTION: Glucerna shake tid as tolerated and appropriate Full liquid diet with no pudding or grits per SLP (confort feeds) RD to monitor plan of care.  NUTRITION DIAGNOSIS: Inadequate oral intake related to decreased appetite and dysphagia as evidenced by patient and SLP report.   Goal: Full liquid, no pudding/grits for comfort only.  Monitor:  Po tolerance, patient desires  Reason for Assessment: MST, low braden  80 y.o. female  Admitting Dx: Acute respiratory failure with hypoxia  ASSESSMENT: Patient admitted with acute respiratory failure with hypoxia/HCAP/Aspiration Pneumonitis, sepsis, iron deficiency anemia.   Patient s/p bedside swallow evaluation today.  Patient required Nissen previously for hiatal hernia per family.  Given chronicity and progression of dysphagia, recurrent hospital admits and patient's functional decline, son Dominica Severin agreeable to consideration for palliative referral (if MD agrees.)    -Patient with poor intake prior to admit confirmed by son who reported patient intake of <300 calories per day. -Patient would cough/regurgitate and bring up chunks of watermelon per family. -Patient reports drinking Glucerna at home and prefers this to Ensure.   -Slightly confused and could not tell me her usual weight.  "It was too much and not nice to look at.  I tried not to know weight." -Patient told her son that she wanted to see her husband (deseased) -28 lb (18%) weight loss in the past 3 months.  Nutrition Focused Physical Exam:  Subcutaneous Fat:  Orbital Region: mild Upper Arm Region: wnl Thoracic and Lumbar Region:  n/a  Muscle:  Temple Region: moderate Clavicle Bone Region: mild Clavicle and Acromion Bone Region: mild Scapular Bone Region: moderate Dorsal Hand: severe Patellar Region: n/a Anterior Thigh Region:  n/a Posterior Calf Region: n/a  Edema: not noted     Height: Ht Readings from Last 1 Encounters:  02/27/14 5\' 2"  (1.575 m)    Weight: Wt Readings from Last 1 Encounters:  02/28/14 120 lb 8 oz (54.658 kg)    Ideal Body Weight: 110 lbs  % Ideal Body Weight: 109  Wt Readings from Last 10 Encounters:  02/28/14 120 lb 8 oz (54.658 kg)  02/22/14 121 lb (54.885 kg)  02/13/14 121 lb 14.6 oz (55.3 kg)  02/08/14 122 lb (55.339 kg)  12/10/13 148 lb 11.2 oz (67.45 kg)  10/17/13 132 lb 11.5 oz (60.2 kg)  10/05/13 140 lb 6.4 oz (63.685 kg)  10/04/13 144 lb (65.318 kg)  07/27/13 145 lb 8 oz (65.998 kg)  06/15/13 144 lb (65.318 kg)    Usual Body Weight: 148  % Usual Body Weight: 81  BMI:  Body mass index is 22.03 kg/(m^2).  Estimated Nutritional Needs: Kcal: 1400-1500 Protein: 60-70 gm Fluid: 1.4L  Skin: intact  Diet Order: Full Liquid  EDUCATION NEEDS: -No education needs identified at this time   Intake/Output Summary (Last 24 hours) at 02/28/14 1657 Last data filed at 02/28/14 1527  Gross per 24 hour  Intake   1255 ml  Output      0 ml  Net   1255 ml    Labs:   Recent Labs Lab 02/27/14 1026 02/27/14 1038 02/27/14 1450 02/28/14 0442  NA 143 144 142 147  K 4.4 4.2 4.5 3.8  CL 102 105 101 106  CO2 21  --  23 27  BUN 30* 31* 30* 30*  CREATININE 1.52* 1.60* 1.52* 1.32*  CALCIUM 9.5  --  9.3 9.4  MG  --   --  2.1  --   PHOS  --   --  3.9  --   GLUCOSE 289* 302* 374* 180*    CBG (last 3)   Recent Labs  02/28/14 0449 02/28/14 0747 02/28/14 1149  GLUCAP 207* 203* 215*    Scheduled Meds: . amLODipine  10 mg Oral q morning - 10a  . atorvastatin  10 mg Oral QPM  . buPROPion  100 mg Oral q morning - 10a  . citalopram  10 mg Oral q morning - 10a  . clopidogrel  75 mg Oral Q breakfast  . docusate sodium  100 mg Oral BID  . ferrous sulfate  325 mg Oral TID WC  . furosemide  20 mg Oral q morning - 10a  . insulin aspart  0-9 Units Subcutaneous  Q4H  . levETIRAcetam  500 mg Intravenous Q12H  . loratadine  10 mg Oral Daily  . Memantine HCl ER  28 mg Oral q morning - 10a  . pantoprazole  40 mg Oral Daily  . piperacillin-tazobactam (ZOSYN)  IV  3.375 g Intravenous 3 times per day  . sodium chloride  3 mL Intravenous Q12H  . tamsulosin  0.4 mg Oral q morning - 10a  . vancomycin  500 mg Intravenous Q24H    Continuous Infusions: . sodium chloride    . sodium chloride 75 mL/hr at 02/28/14 1057    Past Medical History  Diagnosis Date  . COLONIC POLYPS 06/15/2005  . DIABETES MELLITUS, TYPE II 04/22/2007  . HYPERCHOLESTEROLEMIA   . HYPERLIPIDEMIA   . OBESITY   . ANEMIA-IRON DEFICIENCY   . DEPRESSION   . HYPERTENSION   . ALLERGIC RHINITIS   . ESOPHAGEAL STRICTURE   . GERD   . HIATAL HERNIA   . DIVERTICULOSIS, COLON   . BACK PAIN   . OSTEOPOROSIS   . DIZZINESS   . WEIGHT LOSS   . DYSPNEA   . Other dysphagia   . COLONIC POLYPS, HX OF 04/03/2002 & 06/15/2005    TUBULAR ADENOMA  . History of CVA (cerebrovascular accident) 07/29/2012    Noted old, to right thalamus and pons by Head CT Jul 27, 2012  . Dementia 09/17/2012  . Type II or unspecified type diabetes mellitus without mention of complication, uncontrolled 07/16/2012  . Movement disorder     Past Surgical History  Procedure Laterality Date  . Appendectomy    . Abdominal hysterectomy    . Tonsillectomy and adenoidectomy    . Left ankle surgury    . S/p bladder pubovaginal sling    . S/p cystocele/rectocele repair    . Laparoscopic takedown of incarcerated stomach within the chest/nissen  2009  . Esophageal manometry  12/21/2011    Procedure: ESOPHAGEAL MANOMETRY (EM);  Surgeon: Sable Feil, MD;  Location: [facility];  Service: Endoscopy;  Laterality: N/A;    Antonieta Iba, RD, LDN Clinical Inpatient Dietitian Pager:  [phone] Weekend and after hours pager:  [phone]

## 2014-02-28 NOTE — Progress Notes (Signed)
PT Cancellation Note  Patient Details Name: LISA-MARIE RUEGER MRN: [id number] DOB: 14-May-1931   Cancelled Treatment:    Reason Eval/Treat Not Completed: Fatigue/lethargy limiting ability to participate Pt sleeping upon entering room and family requests not to wake.  Will check back tomorrow for evaluation.   LEMYRE,KATHrine E 02/28/2014, 3:24 PM Carmelia Bake, PT, DPT 02/28/2014 Pager: [phone]

## 2014-02-28 NOTE — Progress Notes (Signed)
Inpatient Diabetes Program Recommendations  AACE/ADA: New Consensus Statement on Inpatient Glycemic Control (2013)  Target Ranges:  Prepandial:   less than 140 mg/dL      Peak postprandial:   less than 180 mg/dL (1-2 hours)      Critically ill patients:  140 - 180 mg/dL   Reason for Visit: Hyperglycemia  Diabetes history: DM2 Outpatient Diabetes medications: Lantus 20 units QAM Current orders for Inpatient glycemic control: Novolog sensitive Q4H  Results for ESTEPHANIE, HUBBS (MRN [id number]) as of 02/28/2014 10:10  Ref. Range 02/27/2014 17:28 02/27/2014 22:47 02/28/2014 01:37 02/28/2014 04:49 02/28/2014 07:47  Glucose-Capillary Latest Range: 70-99 mg/dL 372 (H) 318 (H) 253 (H) 207 (H) 203 (H)  Results for MIANA, POLITTE (MRN [id number]) as of 02/28/2014 10:10  Ref. Range 02/27/2014 14:50  Hemoglobin A1C Latest Range: <5.7 % 7.7 (H)  Hyperglycemia. Needs basal insulin.  Inpatient Diabetes Program Recommendations Insulin - Basal: Add 1/2 home dose of Lantus  - 10 units QAM.  Note: Will continue to follow. Thank you. Lorenda Peck, RD, LDN, CDE Inpatient Diabetes Coordinator [phone]

## 2014-03-01 DIAGNOSIS — R195 Other fecal abnormalities: Secondary | ICD-10-CM

## 2014-03-01 DIAGNOSIS — K922 Gastrointestinal hemorrhage, unspecified: Secondary | ICD-10-CM

## 2014-03-01 DIAGNOSIS — R110 Nausea: Secondary | ICD-10-CM

## 2014-03-01 DIAGNOSIS — K920 Hematemesis: Secondary | ICD-10-CM

## 2014-03-01 LAB — BASIC METABOLIC PANEL
BUN: 22 mg/dL (ref 6–23)
CHLORIDE: 108 meq/L (ref 96–112)
CO2: 23 mEq/L (ref 19–32)
Calcium: 8.9 mg/dL (ref 8.4–10.5)
Creatinine, Ser: 1.03 mg/dL (ref 0.50–1.10)
GFR calc Af Amer: 57 mL/min — ABNORMAL LOW (ref >90)
GFR, EST NON AFRICAN AMERICAN: 50 mL/min — AB (ref >90)
GLUCOSE: 191 mg/dL — AB (ref 70–99)
Potassium: 3 mEq/L — ABNORMAL LOW (ref 3.7–5.3)
Sodium: 148 mEq/L — ABNORMAL HIGH (ref 137–147)

## 2014-03-01 LAB — LEGIONELLA ANTIGEN, URINE: Legionella Antigen, Urine: NEGATIVE

## 2014-03-01 LAB — CBC
HEMATOCRIT: 27.3 % — AB (ref 36.0–46.0)
Hemoglobin: 8.6 g/dL — ABNORMAL LOW (ref 12.0–15.0)
MCH: 29.5 pg (ref 26.0–34.0)
MCHC: 31.5 g/dL (ref 30.0–36.0)
MCV: 93.5 fL (ref 78.0–100.0)
Platelets: 318 10*3/uL (ref 150–400)
RBC: 2.92 MIL/uL — ABNORMAL LOW (ref 3.87–5.11)
RDW: 14.3 % (ref 11.5–15.5)
WBC: 12.6 10*3/uL — ABNORMAL HIGH (ref 4.0–10.5)

## 2014-03-01 LAB — GLUCOSE, CAPILLARY
GLUCOSE-CAPILLARY: 270 mg/dL — AB (ref 70–99)
Glucose-Capillary: 193 mg/dL — ABNORMAL HIGH (ref 70–99)
Glucose-Capillary: 311 mg/dL — ABNORMAL HIGH (ref 70–99)
Glucose-Capillary: 286 mg/dL — ABNORMAL HIGH (ref 70–99)
Glucose-Capillary: 286 mg/dL — ABNORMAL HIGH (ref 70–99)

## 2014-03-01 LAB — OCCULT BLOOD X 1 CARD TO LAB, STOOL: Fecal Occult Bld: POSITIVE — AB

## 2014-03-01 LAB — HEMOGLOBIN AND HEMATOCRIT, BLOOD
HCT: 27.9 % — ABNORMAL LOW (ref 36.0–46.0)
Hemoglobin: 9 g/dL — ABNORMAL LOW (ref 12.0–15.0)

## 2014-03-01 MED ORDER — FUROSEMIDE 10 MG/ML IJ SOLN
20.0000 mg | Freq: Every day | INTRAMUSCULAR | Status: DC
  Administered 2014-03-02: 20 mg via INTRAVENOUS
  Filled 2014-03-01: qty 2

## 2014-03-01 MED ORDER — METOPROLOL TARTRATE 1 MG/ML IV SOLN
2.5000 mg | Freq: Four times a day (QID) | INTRAVENOUS | Status: DC
  Administered 2014-03-01 – 2014-03-02 (×3): 2.5 mg via INTRAVENOUS
  Filled 2014-03-01 (×7): qty 5

## 2014-03-01 MED ORDER — ONDANSETRON HCL 4 MG/2ML IJ SOLN
4.0000 mg | Freq: Four times a day (QID) | INTRAMUSCULAR | Status: DC
  Administered 2014-03-01 – 2014-03-02 (×3): 4 mg via INTRAVENOUS
  Filled 2014-03-01 (×3): qty 2

## 2014-03-01 MED ORDER — PANTOPRAZOLE SODIUM 40 MG IV SOLR
40.0000 mg | Freq: Two times a day (BID) | INTRAVENOUS | Status: DC
  Administered 2014-03-01 – 2014-03-02 (×3): 40 mg via INTRAVENOUS
  Filled 2014-03-01 (×4): qty 40

## 2014-03-01 MED ORDER — LEVETIRACETAM 100 MG/ML PO SOLN
500.0000 mg | Freq: Two times a day (BID) | ORAL | Status: DC
  Administered 2014-03-01 – 2014-03-02 (×3): 500 mg via ORAL
  Filled 2014-03-01 (×4): qty 5

## 2014-03-01 MED ORDER — LORAZEPAM 2 MG/ML IJ SOLN: 0.5000 mg | INTRAMUSCULAR | Status: DC | PRN

## 2014-03-01 MED ORDER — PROMETHAZINE HCL 25 MG RE SUPP: 25.0000 mg | Freq: Four times a day (QID) | RECTAL | Status: DC | PRN

## 2014-03-01 MED ORDER — PROCHLORPERAZINE EDISYLATE 5 MG/ML IJ SOLN: 10.0000 mg | Freq: Four times a day (QID) | INTRAMUSCULAR | Status: DC | PRN

## 2014-03-01 NOTE — Progress Notes (Signed)
OT Cancellation Note  Patient Details Name: [name] MRN: [id number] DOB: 10/19/1930   Cancelled Treatment:    Reason Eval/Treat Not Completed: Patient declined, has N/V  Raymon Schlarb A 03/01/2014, 10:38 AM

## 2014-03-01 NOTE — Progress Notes (Signed)
Patient XL:[id number] B Delk      DOB: 07-17-1931      [id number]:[id number]  No family at bedside last evening will attempt to schedule this am for goals of care.  Melissa L. Lovena Le, MD MBA The Palliative Medicine Team at [facility] Phone: [phone] Pager: [phone]

## 2014-03-01 NOTE — Consult Note (Signed)
Patient seen, examined, and I agree with the above documentation, including the assessment and plan. [name] has had coffee-ground emesis in the setting of Plavix, with heme positive stool Initially when I saw her we had plans for upper endoscopy but she has since met with Dr. Billey Chang with the palliative care service. After a thorough discussion between Dr. Lovena Le, the patient and family, the decision was made to pursue palliative/hospice care With this in mind we will minimize intervention and cancel upper endoscopy for tomorrow. I recommended daily PPI for comfort and discontinuation of Plavix to potentially avoid further bleeding Please call with any questions

## 2014-03-01 NOTE — Consult Note (Signed)
Consultation  Referring Provider:  [facility]    Primary Care Physician:  Cathlean Cower, MD Primary Gastroenterologist: Verl Blalock, MD        Reason for Consultation: hematemesis             HPI:   [name] is a 80 y.o. female known to Dr. Sharlett Iles for a history of adenomatous polyps and esophageal dysmotility. She is s/p Nissen fundoplication 6759 for reflux and large prolapsing hiatal hernia.  Patient was seen in the office in 2013 for dysphagia. Barium swallow obtained, revealed small HH , diminshed primary peristaltic wave and slightly prominent cricopharyngeus muscle.  Patient was admitted two days ago for AMS, cough and fever. She is being treated for HCAP / aspiration PNA. Family reported significant weight loss and regurgitation of food at home. SLP has evaluated, no overt signs of aspiration or severe oropharyngeal deficits. Due to progressive functional decline Palliative Care to see for goals of care.   We were called to see patient because of hematemesis which started this am. Per RN patient vomited some breakfast contents this am. Since then she has had a couple more episodes of coffee ground emesis. I don't see NSAIDs on home med list. Vital stable.    Past Medical History  Diagnosis Date  . COLONIC POLYPS 06/15/2005  . DIABETES MELLITUS, TYPE II 04/22/2007  . HYPERCHOLESTEROLEMIA   . HYPERLIPIDEMIA   . OBESITY   . ANEMIA-IRON DEFICIENCY   . DEPRESSION   . HYPERTENSION   . ALLERGIC RHINITIS   . ESOPHAGEAL STRICTURE   . GERD   . HIATAL HERNIA   . DIVERTICULOSIS, COLON   . BACK PAIN   . OSTEOPOROSIS   . DIZZINESS   . WEIGHT LOSS   . DYSPNEA   . Other dysphagia   . COLONIC POLYPS, HX OF 04/03/2002 & 06/15/2005    TUBULAR ADENOMA  . History of CVA (cerebrovascular accident) 07/29/2012    Noted old, to right thalamus and pons by Head CT Jul 27, 2012  . Dementia 09/17/2012  . Type II or unspecified type diabetes mellitus without mention of  complication, uncontrolled 07/16/2012  . Movement disorder     Past Surgical History  Procedure Laterality Date  . Appendectomy    . Abdominal hysterectomy    . Tonsillectomy and adenoidectomy    . Left ankle surgury    . S/p bladder pubovaginal sling    . S/p cystocele/rectocele repair    . Laparoscopic takedown of incarcerated stomach within the chest/nissen  2009  . Esophageal manometry  12/21/2011    Procedure: ESOPHAGEAL MANOMETRY (EM);  Surgeon: Sable Feil, MD;  Location: [facility];  Service: Endoscopy;  Laterality: N/A;    Family History  Problem Relation Age of Onset  . Heart disease Mother 35  . Diabetes Mother   . Heart attack Mother   . Colon cancer Father   . Diabetes Father      History  Substance Use Topics  . Smoking status: Never Smoker   . Smokeless tobacco: Never Used  . Alcohol Use: No    Prior to Admission medications   Medication Sig Start Date End Date Taking? Authorizing Provider  amLODipine (NORVASC) 10 MG tablet Take 10 mg by mouth every morning.    Yes Historical Provider, MD  atorvastatin (LIPITOR) 10 MG tablet Take 10 mg by mouth every evening.    Yes Historical Provider, MD  buPROPion (WELLBUTRIN SR) 100 MG 12 hr tablet  Take 100 mg by mouth every morning.   Yes Historical Provider, MD  cetirizine (ZYRTEC) 10 MG tablet Take 10 mg by mouth every morning.    Yes Historical Provider, MD  citalopram (CELEXA) 10 MG tablet Take 10 mg by mouth every morning.   Yes Historical Provider, MD  clonazePAM (KLONOPIN) 0.25 MG disintegrating tablet Take 0.25 mg by mouth 2 (two) times daily as needed (agitation, anxiety).   Yes Historical Provider, MD  clopidogrel (PLAVIX) 75 MG tablet Take 75 mg by mouth daily with breakfast.   Yes Historical Provider, MD  docusate sodium (COLACE) 100 MG capsule Take 100 mg by mouth 2 (two) times daily.   Yes Historical Provider, MD  fentaNYL (DURAGESIC - DOSED MCG/HR) 25 MCG/HR patch Place 25 mcg onto the skin every 3  (three) days.   Yes Historical Provider, MD  ferrous sulfate 325 (65 FE) MG tablet Take 325 mg by mouth 3 (three) times daily with meals.   Yes Historical Provider, MD  furosemide (LASIX) 20 MG tablet Take 20 mg by mouth every morning.   Yes Historical Provider, MD  insulin glargine (LANTUS) 100 UNIT/ML injection Inject 20 Units into the skin every morning.    Yes Historical Provider, MD  levETIRAcetam (KEPPRA) 500 MG tablet Take 500 mg by mouth 2 (two) times daily.  02/08/14  Yes Biagio Borg, MD  Memantine HCl ER (NAMENDA XR) 28 MG CP24 Take 28 mg by mouth every morning.   Yes Historical Provider, MD  metFORMIN (GLUCOPHAGE) 500 MG tablet Take 500 mg by mouth 2 (two) times daily with a meal.   Yes Historical Provider, MD  omeprazole (PRILOSEC) 20 MG capsule Take 20 mg by mouth every morning.   Yes Historical Provider, MD  tamsulosin (FLOMAX) 0.4 MG CAPS capsule Take 0.4 mg by mouth every morning.   Yes Historical Provider, MD  risperiDONE (RISPERDAL) 1 MG tablet Take 1 mg by mouth 2 (two) times daily as needed (anxiety, agitation).    Historical Provider, MD    Current Facility-Administered Medications  Medication Dose Route Frequency Provider Last Rate Last Dose  . 0.9 %  sodium chloride infusion   Intravenous Continuous Donne Hazel, MD 75 mL/hr at 03/01/14 1213    . albuterol (PROVENTIL) (2.5 MG/3ML) 0.083% nebulizer solution 2.5 mg  2.5 mg Nebulization Q2H PRN Robbie Lis, MD      . buPROPion [facility] SR) 12 hr tablet 100 mg  100 mg Oral q morning - 10a Robbie Lis, MD   100 mg at 03/01/14 1029  . citalopram (CELEXA) tablet 10 mg  10 mg Oral q morning - 10a Robbie Lis, MD   10 mg at 03/01/14 1029  . docusate sodium (COLACE) capsule 100 mg  100 mg Oral BID Robbie Lis, MD   100 mg at 02/28/14 1224  . feeding supplement (GLUCERNA SHAKE) (GLUCERNA SHAKE) liquid 237 mL  237 mL Oral TID BM Darrol Jump, RD   237 mL at 02/28/14 2000  . ferrous sulfate tablet 325 mg  325 mg Oral TID  WC Robbie Lis, MD   325 mg at 03/01/14 1212  . furosemide (LASIX) injection 20 mg  20 mg Intravenous Daily Donne Hazel, MD      . insulin aspart (novoLOG) injection 0-9 Units  0-9 Units Subcutaneous Q4H Donne Hazel, MD   5 Units at 03/01/14 1212  . ipratropium (ATROVENT) nebulizer solution 0.5 mg  0.5 mg Nebulization Q2H PRN Dedra Skeens  Vivi Ferns, MD      . levETIRAcetam (KEPPRA) 100 MG/ML solution 500 mg  500 mg Oral BID Donne Hazel, MD   500 mg at 03/01/14 1029  . loratadine (CLARITIN) tablet 10 mg  10 mg Oral Daily Robbie Lis, MD   10 mg at 02/28/14 2703  . LORazepam (ATIVAN) injection 0.5 mg  0.5 mg Intravenous Q4H PRN Donne Hazel, MD      . Memantine HCl ER CP24 28 mg  28 mg Oral q morning - 10a Robbie Lis, MD   28 mg at 03/01/14 1029  . metoprolol (LOPRESSOR) injection 2.5 mg  2.5 mg Intravenous 4 times per day Donne Hazel, MD      . ondansetron [facility]) tablet 4 mg  4 mg Oral Q6H PRN Robbie Lis, MD       Or  . ondansetron [facility]) injection 4 mg  4 mg Intravenous Q6H PRN Robbie Lis, MD   4 mg at 03/01/14 1029  . pantoprazole (PROTONIX) injection 40 mg  40 mg Intravenous Q12H Donne Hazel, MD      . piperacillin-tazobactam (ZOSYN) IVPB 3.375 g  3.375 g Intravenous 3 times per day Clovis Riley, RPH   3.375 g at 03/01/14 0526  . prochlorperazine (COMPAZINE) injection 10 mg  10 mg Intravenous Q6H PRN Donne Hazel, MD      . promethazine (PHENERGAN) suppository 25 mg  25 mg Rectal Q6H PRN Donne Hazel, MD      . risperiDONE (RISPERDAL) tablet 1 mg  1 mg Oral BID PRN Robbie Lis, MD      . sodium chloride 0.9 % injection 3 mL  3 mL Intravenous Q12H Robbie Lis, MD   3 mL at 02/27/14 2056  . tamsulosin (FLOMAX) capsule 0.4 mg  0.4 mg Oral q morning - 10a Robbie Lis, MD   0.4 mg at 02/28/14 0916  . vancomycin (VANCOCIN) 500 mg in sodium chloride 0.9 % 100 mL IVPB  500 mg Intravenous Q24H Clovis Riley, RPH   500 mg at 03/01/14 5009    Allergies  as of 02/27/2014 - Review Complete 02/27/2014  Allergen Reaction Noted  . Other Nausea And Vomiting 12/10/2013  . Codeine Nausea And Vomiting and Rash 01/31/2008  . Hydrocodone-acetaminophen Nausea And Vomiting and Rash 01/31/2008  . Keflex [cephalexin] Rash and Other (See Comments) 06/05/2013  . Oxycodone-acetaminophen Nausea And Vomiting and Rash 10/24/2007    Review of Systems:    Unobtainable due to AMS. Patient believes it is 25 and she is at her son's house   Physical Exam:  Vital signs in last 24 hours: Temp:  [98.1 F (36.7 C)-99.1 F (37.3 C)] 98.1 F (36.7 C) (06/25 0500) Pulse Rate:  [87-90] 87 (06/25 0500) Resp:  [14-18] 14 (06/25 0500) BP: (125-164)/(66-70) 125/68 mmHg (06/25 0500) SpO2:  [99 %] 99 % (06/25 0500) Weight:  [121 lb 0.9 oz (54.91 kg)] 121 lb 0.9 oz (54.91 kg) (06/25 0500) Last BM Date: 02/27/14 General:   Pleasant white female in NAD Head:  Normocephalic and atraumatic. Eyes:   No icterus.   Conjunctiva pink. Ears:  Normal auditory acuity. Neck:  Supple; no masses felt Lungs:  Respirations even and unlabored.Diminished breath sounds in whole right lung. No wheezes.   Heart:  Regular rate and rhythm Abdomen:  Soft, nondistended, nontender. Normal bowel sounds. No appreciable masses or hepatomegaly.  Rectal:  Not performed.  Msk:  Symmetrical without  gross deformities.  Extremities:  Without edema. Neurologic:  Alert , confused Skin:  Intact without significant lesions or rashes. Cervical Nodes:  No significant cervical adenopathy. Psych:  Alert and cooperative. Normal affect.  LAB RESULTS:  Recent Labs  02/27/14 1450 02/28/14 0442 03/01/14 0439  WBC 13.4* 13.9* 12.6*  HGB 10.0* 9.2* 8.6*  HCT 30.9* 29.2* 27.3*  PLT 360 345 318   BMET  Recent Labs  02/27/14 1450 02/28/14 0442 03/01/14 0439  NA 142 147 148*  K 4.5 3.8 3.0*  CL 101 106 108  CO2 23 27 23   GLUCOSE 374* 180* 191*  BUN 30* 30* 22  CREATININE 1.52* 1.32* 1.03    CALCIUM 9.3 9.4 8.9   LFT  Recent Labs  02/28/14 0442  PROT 6.8  ALBUMIN 2.9*  AST 12  ALT 11  ALKPHOS 106  BILITOT 0.3   PT/INR  Recent Labs  02/27/14 1450  LABPROT 14.8  INR 1.19    PREVIOUS ENDOSCOPIES:            Colonoscopy October 2006 for polyp surveillance. Prep fair, exam to cecum. Polyp removed (adenoma)  EGD Sept 2006 for dysphagia. Hiatal hernia and distal esoph stricture (dilated with Venia Minks)   Impression / Plan:   1. Pleasantly confused 80 year old female with coffee ground looking emesis x 3 this am. Patient is on Plavix. Baseline hgb difficult to determine, maybe mid 10 range. Hgb 10.0 at time of this admission, it has slowly drifted down to 8.6 today. RN reports normal colored, heme positive stool.  Plavix now on hold. QD PO Protonix now changed to IV BID. Patient DNR, tired of tests per son. We discussed risks/ benefits of EGD, especially if patient will need to remain on Plavix. He agrees to proceed with EGD.   2. Normocytic anemia, chronic. She is on oral iron.  Hgb has declined over last couple of days which could be from low grade GI bleed. BUN has been elevated but not out of proportion to creatinine level. Will recheck hgb since last lab was prior to hematemesis episodes. Transfuse if needed.   3. Chronic Pllavix, ? For CVA   Thanks   LOS: 2 days   Tye Savoy  03/01/2014, 1:20 PM

## 2014-03-01 NOTE — Progress Notes (Signed)
[facility] PROGRESS NOTE  [name] SPQ:[id number] DOB: 13-Mar-1931 DOA: 02/27/2014 PCP: Cathlean Cower, MD  Assessment/Plan: Acute respiratory failure with hypoxia / HCAP/ Aspiration pneumonitis - started broad spectrum abx coverage with zosyn and vancomycin  - follow up blood culture results, legionella and strep pneumonia results  - On albuterol and Atrovent BD as needed every 2 hours for shortness of breath or wheezing  - oxygen support via [place] to keep O2 saturation above 90% - currently on min O2 support Sepsis  - secondary to HCAP vs aspiration pneumonia  - met criteria of sepsis with fever, tachycardia, tachypnea and WBC count 12.2, evidence of pneumonia on CXR  - on vanco and zosyn  - follow up blood and urine culture results; follow up resp culture results  HYPERLIPIDEMIA  - continue statin therapy  ANEMIA-IRON DEFICIENCY  - continue ferrous sulfate supplementation  - hemoglobin is 8.6 from 10.5 on admit with n/v and coffee ground emesis - hold plavix and consult GI Acute blood loss anemia secondary to suspected UGI bleeding - Will hold plavix -Discussed with family. Unsure yet if patient is agreeable to blood transfusion. -monitor for now DEPRESSION  - continue Wellbutrin and celexa  HYPERTENSION  - continue Norvasc 10 mg daily  GERD  - continue protonix daily  BACK PAIN  - has fentanyl patch  Dementia  - continue memantine  Protein-calorie malnutrition, severe  - nutrition consulted  Seizure  - continue Keppra  Diabetes  - hold metformin due to renal insufficiency  - SSI ordered  CKD (chronic kidney disease) stage 3, GFR 30-59 ml/min  - baseline creatinine 1.16 most recently, now creatinine is 1.32 DVT prophylaxis: SCD bilaterally.   Code Status: DNR Family Communication: Pt in room, Discussed with son over phone. Disposition Plan: Pending   Consultants:  Palliative Care  Procedures:    Antibiotics:  Vancomycin 02/27/14>>>  Zosyn  02/27/14>>>  HPI/Subjective: Pt with n/v and coffee ground emesis this AM  Objective: Filed Vitals:   02/28/14 0500 02/28/14 1500 02/28/14 2100 03/01/14 0500  BP: 129/70 164/66 131/70 125/68  Pulse: 85 90 89 87  Temp: 98.8 F (37.1 C) 99.1 F (37.3 C) 98.9 F (37.2 C) 98.1 F (36.7 C)  TempSrc: Oral Oral Oral Oral  Resp: _0 Height:      Weight: 120 lb 8 oz (54.658 kg)   121 lb 0.9 oz (54.91 kg)  SpO2: 98% 99% 99% 99%    Intake/Output Summary (Last 24 hours) at 03/01/14 1218 Last data filed at 03/01/14 0846  Gross per 24 hour  Intake   2650 ml  Output      0 ml  Net   2650 ml   Filed Weights   02/27/14 1331 02/28/14 0500 03/01/14 0500  Weight: 121 lb 11.1 oz (55.2 kg) 120 lb 8 oz (54.658 kg) 121 lb 0.9 oz (54.91 kg)    Exam:   General:  Awake, in nad  Cardiovascular: regular, s1, s2  Respiratory: normal resp effort, no wheezing  Abdomen: soft,nondistended  Musculoskeletal: perfused, no clubbing   Data Reviewed: Basic Metabolic Panel:  Recent Labs Lab 02/27/14 1026 02/27/14 1038 02/27/14 1450 02/28/14 0442 03/01/14 0439  NA 143 144 142 147 148*  K 4.4 4.2 4.5 3.8 3.0*  CL 102 105 101 106 108  CO2 21  --  _1 GLUCOSE 289* 302* 374* 180* 191*  BUN 30* 31* 30* 30* 22  CREATININE 1.52* 1.60* 1.52* 1.32* 1.03  CALCIUM 9.5  --  9.3 9.4 8.9  MG  --   --  2.1  --   --   PHOS  --   --  3.9  --   --    Liver Function Tests:  Recent Labs Lab 02/27/14 1026 02/27/14 1450 02/28/14 0442  AST _0 ALT _1 ALKPHOS 126* 123* 106  BILITOT 0.2* 0.3 0.3  PROT 7.0 7.1 6.8  ALBUMIN 3.2* 3.2* 2.9*   No results found for this basename: LIPASE, AMYLASE,  in the last 168 hours No results found for this basename: AMMONIA,  in the last 168 hours CBC:  Recent Labs Lab 02/27/14 1026 02/27/14 1038 02/27/14 1450 02/28/14 0442 03/01/14 0439  WBC 12.2*  --  13.4* 13.9* 12.6*  NEUTROABS  --   --  11.6*  --   --   HGB 10.0* 10.5* 10.0*  9.2* 8.6*  HCT 30.7* 31.0* 30.9* 29.2* 27.3*  MCV 91.9  --  92.2 92.7 93.5  PLT 364  --  360 345 318   Cardiac Enzymes:  Recent Labs Lab 02/27/14 1026  TROPONINI <0.30   BNP (last 3 results)  Recent Labs  05/22/13 1640 10/13/13 1305 10/16/13 0423  PROBNP 173.3 1431.0* 718.6*   CBG:  Recent Labs Lab 02/28/14 2032 02/28/14 2329 03/01/14 0408 03/01/14 0744 03/01/14 1144  GLUCAP 290* 193* 193* 311* 286*    Recent Results (from the past 240 hour(s))  URINE CULTURE     Status: None   Collection Time    02/27/14 10:28 AM      Result Value Ref Range Status   Specimen Description URINE, CATHETERIZED   Final   Special Requests NONE   Final   Culture  Setup Time     Final   Value: 02/27/2014 13:11     Performed at [facility] Count     Final   Value: NO GROWTH     Performed at [facility]   Culture     Final   Value: NO GROWTH     Performed at [facility]   Report Status 02/28/2014 FINAL   Final  CULTURE, BLOOD (ROUTINE X 2)     Status: None   Collection Time    02/27/14 10:29 AM      Result Value Ref Range Status   Specimen Description BLOOD RIGHT HAND   Final   Special Requests BOTTLES DRAWN AEROBIC AND ANAEROBIC 1CC EACH   Final   Culture  Setup Time     Final   Value: 02/27/2014 13:02     Performed at [facility]   Culture     Final   Value:        BLOOD CULTURE RECEIVED NO GROWTH TO DATE CULTURE WILL BE HELD FOR 5 DAYS BEFORE ISSUING A FINAL NEGATIVE REPORT     Performed at [facility]   Report Status PENDING   Incomplete  CULTURE, BLOOD (ROUTINE X 2)     Status: None   Collection Time    02/27/14 10:32 AM      Result Value Ref Range Status   Specimen Description BLOOD LEFT HAND   Final   Special Requests BOTTLES DRAWN AEROBIC AND ANAEROBIC [facility] EACH   Final   Culture  Setup Time     Final   Value: 02/27/2014 13:02     Performed at [facility]  Final   Value:        BLOOD  CULTURE RECEIVED NO GROWTH TO DATE CULTURE WILL BE HELD FOR 5 DAYS BEFORE ISSUING A FINAL NEGATIVE REPORT     Performed at [facility]   Report Status PENDING   Incomplete     Studies: No results found.  Scheduled Meds: . amLODipine  10 mg Oral q morning - 10a  . atorvastatin  10 mg Oral QPM  . buPROPion  100 mg Oral q morning - 10a  . citalopram  10 mg Oral q morning - 10a  . clopidogrel  75 mg Oral Q breakfast  . docusate sodium  100 mg Oral BID  . feeding supplement (GLUCERNA SHAKE)  237 mL Oral TID BM  . ferrous sulfate  325 mg Oral TID WC  . furosemide  20 mg Oral q morning - 10a  . insulin aspart  0-9 Units Subcutaneous Q4H  . levETIRAcetam  500 mg Oral BID  . loratadine  10 mg Oral Daily  . Memantine HCl ER  28 mg Oral q morning - 10a  . pantoprazole  40 mg Oral Daily  . piperacillin-tazobactam (ZOSYN)  IV  3.375 g Intravenous 3 times per day  . sodium chloride  3 mL Intravenous Q12H  . tamsulosin  0.4 mg Oral q morning - 10a  . vancomycin  500 mg Intravenous Q24H   Continuous Infusions: . sodium chloride    . sodium chloride 75 mL/hr at 03/01/14 1213    Principal Problem:   Acute respiratory failure with hypoxia Active Problems:   HYPERLIPIDEMIA   ANEMIA-IRON DEFICIENCY   DEPRESSION   HYPERTENSION   GERD   BACK PAIN   Dementia   Protein-calorie malnutrition, severe   Seizure   HCAP (healthcare-associated pneumonia)   Diabetes   CKD (chronic kidney disease) stage 3, GFR 30-59 ml/min  Time spent: 37mn  [facility], [facility] Pager [phone] If 7PM-7AM, please contact night-coverage at [email or web address], password [email or web address]6/25/2015, 12:18 PM  LOS: 2 days

## 2014-03-01 NOTE — Consult Note (Addendum)
Patient [name]      DOB: May 01, 1931      ZLY:[id number]  Summary of goals of care; full note to follow:  Met with son Sherol Dade , and Granddaughter Mickel Baas.  Patient pleasantly demented without capacity for decision making.  Family shared that '[name]' as been declining for a while.  She has had increased hospital stays over the last few months and has been eating less and less.  She lives at home with them with a CNA to assist them.  She has previously expressed that she would not want her life prolonged in an artificial manner and so they honor her DNR and her desire to not have a feeding tube which makes this time and this disease particularly uncomfortable for them because they know the she can not swallow.  This am the patient developed an upper GI bleed with vomiting and hematemesis.  Family was considering having an EGD to assess the situation but after talking about her significant decline and coming to terms that with her dysphagia will not go away or get better, they have decided on not doing the EGD but focusing on full comfort with transition to residential hospice.  The family is familiar with [facility] and they lost their father-in-law their many years ago.They can't say enough good things about their experience.     Discussed with Dr. Wyline Copas and Dr. Hilarie Fredrickson  1.  DNR  2.  Upper GI bleed: continue PPI bid even at discharge until patient cant take po any further.  Would schedule some zofran for now.    3.  Dementia with behavior: I am in favor of limiting the pills that she take. Can try to dc antidepressant a memantine . Might need to resume if behavior or tremor worsens.   4.  Refer for Hospice facility placement.  Family familiar with [facility].   Total time: 300- 430 pm Brookes Craine L. Lovena Le, MD MBA The Palliative Medicine Team at [facility] Phone: [phone] Pager: [phone]   Addendum: family ok with no further lab draws and no blood  transfusions.   Selicia Windom L. Lovena Le, MD MBA The Palliative Medicine Team at [facility] Phone: [phone] Pager: [phone]

## 2014-03-01 NOTE — Evaluation (Signed)
Physical Therapy Evaluation Patient Details Name: [name] MRN: [id number] DOB: 09/13/1930 Today's Date: 03/01/2014   History of Present Illness  80 year old female with past medical history of dementia, anemia, dyslipidemia, hypertension, depression, recent admission for pubic rami fracture (discharged 02/13/2014) and presented to [facility] ED 02/27/2014 with altered mental status and admitted with acute respiratory failure/HCAP/ Aspiration pneumonitis and sepsis  Clinical Impression  Pt admitted with above. Pt currently with functional limitations due to the deficits listed below (see PT Problem List). Pt will benefit from skilled PT to increase their independence and safety with mobility to allow discharge to the venue listed below.  Pt's granddaughter present and reports pt came home from SNF after last admission and has been requiring assist OOB and has home health care.  If SNF no an option then recommend continuing home health PT.     Follow Up Recommendations SNF;Supervision/Assistance - 24 hour    Equipment Recommendations  None recommended by PT    Recommendations for Other Services       Precautions / Restrictions Precautions Precautions: Fall Restrictions Weight Bearing Restrictions: No RLE Weight Bearing: Weight bearing as tolerated LLE Weight Bearing: Weight bearing as tolerated Other Position/Activity Restrictions: per last admission      Mobility  Bed Mobility Overal bed mobility: Needs Assistance Bed Mobility: Supine to Sit;Sit to Supine     Supine to sit: Max assist;HOB elevated Sit to supine: Max assist   General bed mobility comments: verbal cues for assisting, utilized bed pad to scoot and position  Transfers                    Ambulation/Gait                Stairs            Wheelchair Mobility    Modified Rankin (Stroke Patients Only)       Balance Overall balance assessment: Needs assistance Sitting-balance support: Feet  unsupported;Bilateral upper extremity supported Sitting balance-Leahy Scale: Poor                                       Pertinent Vitals/Pain Reports pain everywhere all the time however agreeable to activity as tolerated, repositioned to comfort    Home Living Family/patient expects to be discharged to:: Private residence Living Arrangements: Children Available Help at Discharge: Family Type of Home: House Home Access: Stairs to enter Entrance Stairs-Rails: Left Entrance Stairs-Number of Steps: 7 Home Layout: Two level;Able to live on main level with bedroom/bathroom Home Equipment: Gilford Rile - 2 wheels;Cane - single point      Prior Function Level of Independence: Needs assistance   Gait / Transfers Assistance Needed: requires assist for transfers and gait, receiving PT at home     Comments: per granddaughter, pt went to SNF upon last admission until used all her SNF days and then went home     Hand Dominance   Dominant Hand: Right    Extremity/Trunk Assessment   Upper Extremity Assessment: Generalized weakness           Lower Extremity Assessment: Generalized weakness         Communication   Communication: No difficulties  Cognition Arousal/Alertness: Awake/alert Behavior During Therapy: WFL for tasks assessed/performed Overall Cognitive Status: History of cognitive impairments - at baseline       Memory: Decreased recall of precautions;Decreased short-term memory  General Comments      Exercises        Assessment/Plan    PT Assessment Patient needs continued PT services  PT Diagnosis Generalized weakness;Difficulty walking   PT Problem List Decreased strength;Decreased balance;Decreased mobility;Decreased cognition;Decreased knowledge of use of DME;Decreased safety awareness;Pain;Decreased knowledge of precautions  PT Treatment Interventions DME instruction;Gait training;Functional mobility training;Therapeutic  activities;Therapeutic exercise;Balance training;Patient/family education   PT Goals (Current goals can be found in the Care Plan section) Acute Rehab PT Goals PT Goal Formulation: Patient unable to participate in goal setting Time For Goal Achievement: 03/15/14 Potential to Achieve Goals: Fair    Frequency Min 2X/week   Barriers to discharge        Co-evaluation               End of Session   Activity Tolerance: Patient tolerated treatment well Patient left: in bed;with call bell/phone within reach;with nursing/sitter in room;with family/visitor present           Time: 9373-4287 PT Time Calculation (min): 18 min   Charges:   PT Evaluation $Initial PT Evaluation Tier I: 1 Procedure PT Treatments $Therapeutic Activity: 8-22 mins   PT G Codes:          [name],KATHrine E 03/01/2014, 3:10 PM Carmelia Bake, PT, DPT 03/01/2014 Pager: [phone]

## 2014-03-02 ENCOUNTER — Encounter (HOSPITAL_COMMUNITY)
Admission: EM | Disposition: A | Discharge status: Hospice | Payer: Self-pay | Source: Home / Self Care | Attending: Internal Medicine

## 2014-03-02 DIAGNOSIS — F329 Major depressive disorder, single episode, unspecified: Secondary | ICD-10-CM

## 2014-03-02 DIAGNOSIS — F3289 Other specified depressive episodes: Secondary | ICD-10-CM

## 2014-03-02 DIAGNOSIS — R569 Unspecified convulsions: Secondary | ICD-10-CM

## 2014-03-02 DIAGNOSIS — D509 Iron deficiency anemia, unspecified: Secondary | ICD-10-CM

## 2014-03-02 LAB — GLUCOSE, CAPILLARY
GLUCOSE-CAPILLARY: 182 mg/dL — AB (ref 70–99)
Glucose-Capillary: 194 mg/dL — ABNORMAL HIGH (ref 70–99)
Glucose-Capillary: 351 mg/dL — ABNORMAL HIGH (ref 70–99)

## 2014-03-02 SURGERY — EGD (ESOPHAGOGASTRODUODENOSCOPY)
Anesthesia: Moderate Sedation

## 2014-03-02 MED ORDER — ONDANSETRON HCL 4 MG PO TABS: 4.0000 mg | ORAL_TABLET | Freq: Three times a day (TID) | ORAL | Status: AC | PRN

## 2014-03-02 MED ORDER — AMOXICILLIN-POT CLAVULANATE 875-125 MG PO TABS: 1.0000 | ORAL_TABLET | Freq: Two times a day (BID) | ORAL | Status: AC

## 2014-03-02 MED ORDER — PANTOPRAZOLE SODIUM 40 MG PO TBEC: 40.0000 mg | DELAYED_RELEASE_TABLET | Freq: Two times a day (BID) | ORAL | Status: AC

## 2014-03-02 MED ORDER — LEVOFLOXACIN 500 MG PO TABS: 500.0000 mg | ORAL_TABLET | ORAL | Status: DC

## 2014-03-02 NOTE — Progress Notes (Signed)
Report called to [place] at [facility]. Hospital course reviewed and all questions answered. Callie Fielding RN

## 2014-03-02 NOTE — Discharge Summary (Addendum)
Physician Discharge Summary  [name] MRN:[id number] DOB: 07/24/1931 DOA: 02/27/2014  PCP: James John, MD  Admit date: 02/27/2014 Discharge date: 03/02/2014  Time spent: 35 minutes  Recommendations for Outpatient Follow-up:  1. Follow up with PCP as needed  Discharge Diagnoses:  Principal Problem:   Acute respiratory failure with hypoxia Active Problems:   HYPERLIPIDEMIA   ANEMIA-IRON DEFICIENCY   DEPRESSION   HYPERTENSION   GERD   BACK PAIN   Dementia   Protein-calorie malnutrition, severe   Seizure   HCAP (healthcare-associated pneumonia)   Diabetes   CKD (chronic kidney disease) stage 3, GFR 30-59 ml/min   Discharge Condition: Stable  Diet recommendation: Diet as tolerated  Filed Weights   03/01/14 0500 03/02/14 0540 03/02/14 0744  Weight: 121 lb 0.9 oz (54.91 kg) 129 lb 13.6 oz (58.9 kg) 126 lb 15.8 oz (57.6 kg)    History of present illness:  80 year old female with past medical history of dementia, anemia, dyslipidemia, hypertension, depression, recent admission for pubic rami fracture (discharged 02/13/2014) and also had UTI during that admission who now presented to [facility] ED 02/27/2014 with altered mental status for past 1 day prior to this admission. Family is not present at the bedside to give details of medical history. Pt apparently also had a productive cough and fever. No vomiting and no shortness of breath. No reports of blood in stool or urine.  In ED, BP was 153/71, HR 90-101, RR 16-25, T max 102.4 F and oxygen saturation was 90% on 2 L [place] oxygen support. CXR showed new right perihilar airspace opacity compatible with pneumonia or aspiration pneumonitis. She was started on broad spectrum abx coverage, vanco and zosyn. Blood work revealed WBC count 12.2, hemoglobin 10, creatinine 1.52.  Hospital Course:  Acute respiratory failure with hypoxia / HCAP/ Aspiration pneumonitis - started broad spectrum abx coverage with zosyn and vancomycin  - clinically improving   - On albuterol and Atrovent BD as needed every 2 hours for shortness of breath or wheezing  - oxygen support via [place] to keep O2 saturation above 90% - now on min O2 support  - Would complete tx on discharge with augmetin. Pt has tolerated this in the past Sepsis  - secondary to HCAP vs aspiration pneumonia  - met criteria of sepsis with fever, tachycardia, tachypnea and WBC count 12.2, evidence of pneumonia on CXR  - initially on vanco and zosyn  - Sepsis resolved  HYPERLIPIDEMIA  - continued statin therapy  ANEMIA-IRON DEFICIENCY  - continue ferrous sulfate supplementation  - hemoglobin noted to be 8.6 from 10.5 on admit with n/v and coffee ground emesis  - stopped plavix and consulted GI  - After discussion with Palliative Care, decision was made to not pursue endoscopy. Instead, pt will cont on BID PPI  Acute blood loss anemia secondary to suspected UGI bleeding  - Will cont to hold plavix  -Per above  DEPRESSION  - continue Wellbutrin and celexa  HYPERTENSION  - continue Norvasc 10 mg daily  GERD  - continue protonix per above  BACK PAIN  - has fentanyl patch  Dementia  - recommendations to stop memantine per Palliative Care  Protein-calorie malnutrition, severe  - nutrition consulted  Seizure  - continue Keppra  Diabetes  - hold metformin due to renal insufficiency  - SSI ordered  CKD (chronic kidney disease) stage 3, GFR 30-59 ml/min  - baseline creatinine 1.16 most recently, now creatinine is 1.32  DVT prophylaxis: SCD bilaterally.     Consultations:  Gastroenterology  Palliative Care  Discharge Exam: Filed Vitals:   03/01/14 1458 03/01/14 2029 03/02/14 0540 03/02/14 0744  BP: 159/59 150/52 169/60   Pulse: 91 81 81   Temp: 98 F (36.7 C) 98.4 F (36.9 C) 97.9 F (36.6 C)   TempSrc: Oral Oral Oral   Resp: 18 20 19   Height:      Weight:   129 lb 13.6 oz (58.9 kg) 126 lb 15.8 oz (57.6 kg)  SpO2: 96% 99% 99%     General: Awake, in nad Cardiovascular:  regular, s1, s2 Respiratory: normal resp effort, no wheezing  Discharge Instructions     Medication List    STOP taking these medications       buPROPion 100 MG 12 hr tablet  Commonly known as:  WELLBUTRIN SR     cetirizine 10 MG tablet  Commonly known as:  ZYRTEC     citalopram 10 MG tablet  Commonly known as:  CELEXA     clopidogrel 75 MG tablet  Commonly known as:  PLAVIX     NAMENDA XR 28 MG Cp24  Generic drug:  Memantine HCl ER      TAKE these medications       amLODipine 10 MG tablet  Commonly known as:  NORVASC  Take 10 mg by mouth every morning.     amoxicillin-clavulanate 875-125 MG per tablet  Commonly known as:  AUGMENTIN  Take 1 tablet by mouth 2 (two) times daily.     atorvastatin 10 MG tablet  Commonly known as:  LIPITOR  Take 10 mg by mouth every evening.     clonazePAM 0.25 MG disintegrating tablet  Commonly known as:  KLONOPIN  Take 0.25 mg by mouth 2 (two) times daily as needed (agitation, anxiety).     docusate sodium 100 MG capsule  Commonly known as:  COLACE  Take 100 mg by mouth 2 (two) times daily.     fentaNYL 25 MCG/HR patch  Commonly known as:  DURAGESIC - dosed mcg/hr  Place 25 mcg onto the skin every 3 (three) days.     ferrous sulfate 325 (65 FE) MG tablet  Take 325 mg by mouth 3 (three) times daily with meals.     furosemide 20 MG tablet  Commonly known as:  LASIX  Take 20 mg by mouth every morning.     insulin glargine 100 UNIT/ML injection  Commonly known as:  LANTUS  Inject 20 Units into the skin every morning.     levETIRAcetam 500 MG tablet  Commonly known as:  KEPPRA  Take 500 mg by mouth 2 (two) times daily.     metFORMIN 500 MG tablet  Commonly known as:  GLUCOPHAGE  Take 500 mg by mouth 2 (two) times daily with a meal.     omeprazole 20 MG capsule  Commonly known as:  PRILOSEC  Take 20 mg by mouth every morning.     ondansetron 4 MG tablet  Commonly known as:  ZOFRAN  Take 1 tablet (4 mg total) by  mouth every 8 (eight) hours as needed for nausea or vomiting.     pantoprazole 40 MG tablet  Commonly known as:  PROTONIX  Take 1 tablet (40 mg total) by mouth 2 (two) times daily.     risperiDONE 1 MG tablet  Commonly known as:  RISPERDAL  Take 1 mg by mouth 2 (two) times daily as needed (anxiety, agitation).     tamsulosin 0.4 MG Caps capsule    Commonly known as:  FLOMAX  Take 0.4 mg by mouth every morning.       Allergies  Allergen Reactions  . Other Nausea And Vomiting    ANESTHESIA AND STRONG PAIN MEDS-VERY SENSITIVE  . Codeine Nausea And Vomiting and Rash  . Hydrocodone-Acetaminophen Nausea And Vomiting and Rash  . Keflex [Cephalexin] Rash and Other (See Comments)    Tolerated Rocephin 02/13/14, TDD  . Oxycodone-Acetaminophen Nausea And Vomiting and Rash      The results of significant diagnostics from this hospitalization (including imaging, microbiology, ancillary and laboratory) are listed below for reference.    Significant Diagnostic Studies: Dg Chest 1 View  02/08/2014   CLINICAL DATA:  Abnormal chest sounds, diabetes  EXAM: CHEST - 1 VIEW  COMPARISON:  CT chest of 10/13/2013 and chest x-ray of the same date  FINDINGS: No focal infiltrate or effusion is seen. The heart is mildly enlarged. Mediastinal and hilar contours appear normal. No acute bony abnormality is seen.  IMPRESSION: Stable mild cardiomegaly.  No active lung disease.   Electronically Signed   By: Ivar Drape M.D.   On: 02/08/2014 15:38   Dg Hip Complete Right  02/10/2014   CLINICAL DATA:  Fall with right hip and pelvic pain.  EXAM: RIGHT HIP - COMPLETE 2+ VIEW  COMPARISON:  12/11/2013 and 10/05/2013  FINDINGS: Exam demonstrates diffuse decreased bone mineralization. There are mild symmetric degenerative changes of the hips. There is a displaced slightly comminuted fracture of the right superior pubic ramus extending to the symphysis. There is also a minimally displaced acute right inferior pubic ramus fracture  towards the ischial tuberosity. There is suggestion of an old left inferior pubic ramus fracture. There are degenerative changes of the spine.  IMPRESSION: Displaced mildly comminuted fracture of the right superior pubic ramus extending to the symphysis. Acute minimal displaced right inferior pubic ramus fracture towards the ischial tuberosity.  Old left inferior pubic ramus fracture.   Electronically Signed   By: Marin Olp M.D.   On: 02/10/2014 14:33   Ct Head Wo Contrast  02/10/2014   CLINICAL DATA:  Fall, rule out bleed.  Dementia.  EXAM: CT HEAD WITHOUT CONTRAST  TECHNIQUE: Contiguous axial images were obtained from the base of the skull through the vertex without intravenous contrast.  COMPARISON:  12/10/2013 and 10/05/2013  FINDINGS: The ventricles and cisterns are within normal. There is mild age related atrophic change which stable. Chronic ischemic microvascular disease is present. Small left frontal infarct. Possible old small right thalamus lacunar infarct. There is no mass, mass effect, shift of midline structures or acute hemorrhage. There is no definite evidence of acute infarction. Remainder the exam is unchanged. Minimal chronic sinus inflammatory disease.  IMPRESSION: No acute intracranial findings.  Age related atrophy and chronic ischemic microvascular disease. Small old left frontal infarct.  Mild chronic sinus inflammatory disease.   Electronically Signed   By: Marin Olp M.D.   On: 02/10/2014 12:17   Dg Chest Port 1 View  02/27/2014   CLINICAL DATA:  Altered mental status.  Fever.  EXAM: PORTABLE CHEST - 1 VIEW  COMPARISON:  02/08/2014.  10/07/2013.  FINDINGS: New right perihilar and infrahilar airspace opacity is present, which may represent pneumonia or aspiration pneumonitis. Pulmonary edema considered unlikely. The cardiopericardial silhouette is within normal limits. Aortic arch and mediastinal contours appear unchanged. Monitoring leads project over the chest. Healed proximal  left humerus fracture.  IMPRESSION: New right perihilar airspace opacity compatible with pneumonia or aspiration pneumonitis.  Followup in 4-6 weeks to ensure radiographic clearing and exclude an underlying lesion is recommended.   Electronically Signed   By: Geoffrey  Lamke M.D.   On: 02/27/2014 11:13   Dg Chest Port 1 View  02/10/2014   CLINICAL DATA:  Pneumonia  EXAM: PORTABLE CHEST - 1 VIEW  COMPARISON:  February 08, 2014  FINDINGS: The lungs are clear. Heart size and pulmonary vascularity are normal. No adenopathy. No bone lesions.  IMPRESSION: No edema or consolidation.   Electronically Signed   By: William  Woodruff M.D.   On: 02/10/2014 18:40    Microbiology: Recent Results (from the past 240 hour(s))  URINE CULTURE     Status: None   Collection Time    02/27/14 10:28 AM      Result Value Ref Range Status   Specimen Description URINE, CATHETERIZED   Final   Special Requests NONE   Final   Culture  Setup Time     Final   Value: 02/27/2014 13:11     Performed at [facility]   Colony Count     Final   Value: NO GROWTH     Performed at [facility]   Culture     Final   Value: NO GROWTH     Performed at [facility]   Report Status 02/28/2014 FINAL   Final  CULTURE, BLOOD (ROUTINE X 2)     Status: None   Collection Time    02/27/14 10:29 AM      Result Value Ref Range Status   Specimen Description BLOOD RIGHT HAND   Final   Special Requests BOTTLES DRAWN AEROBIC AND ANAEROBIC 1CC EACH   Final   Culture  Setup Time     Final   Value: 02/27/2014 13:02     Performed at [facility]   Culture     Final   Value:        BLOOD CULTURE RECEIVED NO GROWTH TO DATE CULTURE WILL BE HELD FOR 5 DAYS BEFORE ISSUING A FINAL NEGATIVE REPORT     Performed at [facility]   Report Status PENDING   Incomplete  CULTURE, BLOOD (ROUTINE X 2)     Status: None   Collection Time    02/27/14 10:32 AM      Result Value Ref Range Status   Specimen Description BLOOD  LEFT HAND   Final   Special Requests BOTTLES DRAWN AEROBIC AND ANAEROBIC 6CC EACH   Final   Culture  Setup Time     Final   Value: 02/27/2014 13:02     Performed at [facility]   Culture     Final   Value:        BLOOD CULTURE RECEIVED NO GROWTH TO DATE CULTURE WILL BE HELD FOR 5 DAYS BEFORE ISSUING A FINAL NEGATIVE REPORT     Performed at [facility]   Report Status PENDING   Incomplete     Labs: Basic Metabolic Panel:  Recent Labs Lab 02/27/14 1026 02/27/14 1038 02/27/14 1450 02/28/14 0442 03/01/14 0439  NA 143 144 142 147 148*  K 4.4 4.2 4.5 3.8 3.0*  CL 102 105 101 106 108  CO2 21  --  23 27 23  GLUCOSE 289* 302* 374* 180* 191*  BUN 30* 31* 30* 30* 22  CREATININE 1.52* 1.60* 1.52* 1.32* 1.03  CALCIUM 9.5  --  9.3 9.4 8.9  MG  --   --  2.1  --   --     PHOS  --   --  3.9  --   --    Liver Function Tests:  Recent Labs Lab 02/27/14 1026 02/27/14 1450 02/28/14 0442  AST _0 ALT _1 ALKPHOS 126* 123* 106  BILITOT 0.2* 0.3 0.3  PROT 7.0 7.1 6.8  ALBUMIN 3.2* 3.2* 2.9*   No results found for this basename: LIPASE, AMYLASE,  in the last 168 hours No results found for this basename: AMMONIA,  in the last 168 hours CBC:  Recent Labs Lab 02/27/14 1026 02/27/14 1038 02/27/14 1450 02/28/14 0442 03/01/14 0439 03/01/14 1510  WBC 12.2*  --  13.4* 13.9* 12.6*  --   NEUTROABS  --   --  11.6*  --   --   --   HGB 10.0* 10.5* 10.0* 9.2* 8.6* 9.0*  HCT 30.7* 31.0* 30.9* 29.2* 27.3* 27.9*  MCV 91.9  --  92.2 92.7 93.5  --   PLT 364  --  360 345 318  --    Cardiac Enzymes:  Recent Labs Lab 02/27/14 1026  TROPONINI <0.30   BNP: BNP (last 3 results)  Recent Labs  05/22/13 1640 10/13/13 1305 10/16/13 0423  PROBNP 173.3 1431.0* 718.6*   CBG:  Recent Labs Lab 03/01/14 1717 03/01/14 2027 03/02/14 0020 03/02/14 0455 03/02/14 0740  GLUCAP 286* 270* 351* 194* 182*   Signed:  CHIU, STEPHEN K  [facility] 03/02/2014,  11:12 AM

## 2014-03-02 NOTE — Progress Notes (Signed)
OT Cancellation Note/Screen  Patient Details Name: [name] MRN: [id number] DOB: May 24, 1931   Cancelled Treatment:    Reason Eval/Treat Not Completed: Other (comment).  Noted that plan is for residential hospice.  Will sign off for OT.  [name],[name] 03/02/2014, 10:52 AM Lesle Chris, OTR/L [phone] 03/02/2014

## 2014-03-02 NOTE — Progress Notes (Signed)
CSW received call from Dr. Lovena Le that palliative care meeting was held & plan is now for residential hospice, family requesting [facility]. CSW made referral to Erling Conte, [facility]. Bed available for patient @ [facility] today. Patient's daughter, Jenny Reichmann made aware. Dr. Wyline Copas aware.   [name], [facility] Clinical Social Worker cell #: [phone]

## 2014-03-02 NOTE — Progress Notes (Signed)
Patient is set to discharge to [facility] today. Patient & family at bedside aware. Discharge packet in [place], [facility] aware. [facility] called for transport.   [name], [facility] Clinical Social Worker cell #: [phone]

## 2014-03-05 LAB — CULTURE, BLOOD (ROUTINE X 2)
Culture: NO GROWTH
Culture: NO GROWTH

## 2014-03-19 ENCOUNTER — Telehealth: Payer: Self-pay

## 2014-03-19 NOTE — Telephone Encounter (Signed)
Patient died @ [facility] per [facility] °

## 2014-03-22 ENCOUNTER — Ambulatory Visit: Payer: Self-pay | Admitting: Neurology

## 2014-04-04 ENCOUNTER — Ambulatory Visit: Payer: Medicare Other | Admitting: Internal Medicine

## 2014-04-06 ENCOUNTER — Ambulatory Visit: Payer: Medicare Other | Admitting: Nurse Practitioner

## 2014-04-07 DEATH — deceased

## 2014-04-12 NOTE — Telephone Encounter (Signed)
Noted  

## 2014-05-11 ENCOUNTER — Ambulatory Visit: Payer: Medicare Other | Admitting: Internal Medicine

## 2014-09-17 ENCOUNTER — Encounter: Payer: Self-pay | Admitting: Gastroenterology
